# Patient Record
Sex: Female | Born: 1965
Health system: Southern US, Community
[De-identification: ages and names within clinical notes are randomized; demographics above are authoritative.]

## PROBLEM LIST (undated history)

## (undated) DIAGNOSIS — I493 Ventricular premature depolarization: Secondary | ICD-10-CM

## (undated) DIAGNOSIS — K2210 Ulcer of esophagus without bleeding: Secondary | ICD-10-CM

## (undated) DIAGNOSIS — E162 Hypoglycemia, unspecified: Secondary | ICD-10-CM

## (undated) DIAGNOSIS — E876 Hypokalemia: Secondary | ICD-10-CM

## (undated) DIAGNOSIS — Z8619 Personal history of other infectious and parasitic diseases: Secondary | ICD-10-CM

## (undated) DIAGNOSIS — J189 Pneumonia, unspecified organism: Secondary | ICD-10-CM

## (undated) DIAGNOSIS — D259 Leiomyoma of uterus, unspecified: Secondary | ICD-10-CM

## (undated) DIAGNOSIS — D649 Anemia, unspecified: Secondary | ICD-10-CM

## (undated) DIAGNOSIS — Z9289 Personal history of other medical treatment: Secondary | ICD-10-CM

## (undated) DIAGNOSIS — K469 Unspecified abdominal hernia without obstruction or gangrene: Secondary | ICD-10-CM

## (undated) DIAGNOSIS — E7800 Pure hypercholesterolemia, unspecified: Secondary | ICD-10-CM

## (undated) DIAGNOSIS — J45909 Unspecified asthma, uncomplicated: Secondary | ICD-10-CM

## (undated) DIAGNOSIS — I959 Hypotension, unspecified: Secondary | ICD-10-CM

## (undated) HISTORY — PX: HERNIA REPAIR: SHX51

## (undated) HISTORY — DX: Unspecified abdominal hernia without obstruction or gangrene: K46.9

## (undated) HISTORY — DX: Hypotension, unspecified: I95.9

## (undated) HISTORY — DX: Personal history of other infectious and parasitic diseases: Z86.19

## (undated) HISTORY — DX: Ventricular premature depolarization: I49.3

## (undated) HISTORY — PX: ABDOMINAL HERNIA REPAIR: SHX539

---

## 2009-04-21 ENCOUNTER — Ambulatory Visit: Payer: Self-pay | Admitting: Diagnostic Radiology

## 2009-04-21 ENCOUNTER — Emergency Department (HOSPITAL_BASED_OUTPATIENT_CLINIC_OR_DEPARTMENT_OTHER): Admission: EM | Admit: 2009-04-21 | Discharge: 2009-04-21 | Payer: Self-pay | Admitting: Emergency Medicine

## 2009-07-20 ENCOUNTER — Emergency Department (HOSPITAL_BASED_OUTPATIENT_CLINIC_OR_DEPARTMENT_OTHER): Admission: EM | Admit: 2009-07-20 | Discharge: 2009-07-20 | Payer: Self-pay | Admitting: Emergency Medicine

## 2009-07-20 ENCOUNTER — Ambulatory Visit: Payer: Self-pay | Admitting: Diagnostic Radiology

## 2010-06-26 LAB — BASIC METABOLIC PANEL
BUN: 10 mg/dL (ref 6–23)
CO2: 29 mEq/L (ref 19–32)
Calcium: 9.2 mg/dL (ref 8.4–10.5)
Chloride: 104 mEq/L (ref 96–112)
Creatinine, Ser: 0.7 mg/dL (ref 0.4–1.2)
Glucose, Bld: 90 mg/dL (ref 70–99)

## 2010-06-26 LAB — DIFFERENTIAL
Eosinophils Absolute: 0.3 10*3/uL (ref 0.0–0.7)
Eosinophils Relative: 2 % (ref 0–5)
Lymphs Abs: 4.4 10*3/uL — ABNORMAL HIGH (ref 0.7–4.0)
Monocytes Absolute: 0.8 10*3/uL (ref 0.1–1.0)

## 2010-06-26 LAB — POCT CARDIAC MARKERS: CKMB, poc: <1 ng/mL — ABNORMAL LOW (ref 1.0–8.0)

## 2010-06-26 LAB — CBC
Platelets: 429 10*3/uL — ABNORMAL HIGH (ref 150–400)
WBC: 14.7 10*3/uL — ABNORMAL HIGH (ref 4.0–10.5)

## 2010-06-29 LAB — BASIC METABOLIC PANEL
BUN: 12 mg/dL (ref 6–23)
GFR calc Af Amer: >60 mL/min (ref >60)
Potassium: 4.2 mEq/L (ref 3.5–5.1)
Sodium: 141 mEq/L (ref 135–145)

## 2010-06-29 LAB — DIFFERENTIAL
Basophils Absolute: 0.2 10*3/uL — ABNORMAL HIGH (ref 0.0–0.1)
Basophils Relative: 2 % — ABNORMAL HIGH (ref 0–1)
Eosinophils Absolute: 0.2 10*3/uL (ref 0.0–0.7)
Eosinophils Relative: 1 % (ref 0–5)
Monocytes Absolute: 0.5 10*3/uL (ref 0.1–1.0)
Monocytes Relative: 4 % (ref 3–12)

## 2010-06-29 LAB — CBC
Hemoglobin: 13.4 g/dL (ref 12.0–15.0)
MCHC: 32.3 g/dL (ref 30.0–36.0)
MCV: 87.6 fL (ref 78.0–100.0)
Platelets: 398 10*3/uL (ref 150–400)
RBC: 4.74 MIL/uL (ref 3.87–5.11)
RDW: 14 % (ref 11.5–15.5)
WBC: 11.9 10*3/uL — ABNORMAL HIGH (ref 4.0–10.5)

## 2010-06-29 LAB — POCT CARDIAC MARKERS
CKMB, poc: <1 ng/mL — ABNORMAL LOW (ref 1.0–8.0)
Troponin i, poc: <0.05 ng/mL (ref 0.00–0.09)

## 2010-08-19 ENCOUNTER — Emergency Department (INDEPENDENT_AMBULATORY_CARE_PROVIDER_SITE_OTHER): Payer: Self-pay

## 2010-08-19 ENCOUNTER — Emergency Department (HOSPITAL_BASED_OUTPATIENT_CLINIC_OR_DEPARTMENT_OTHER)
Admission: EM | Admit: 2010-08-19 | Discharge: 2010-08-20 | Disposition: A | Discharge status: Other Acute Inpatient Hospital | Payer: Self-pay | Attending: Emergency Medicine | Admitting: Emergency Medicine

## 2010-08-19 DIAGNOSIS — R002 Palpitations: Secondary | ICD-10-CM

## 2010-08-19 DIAGNOSIS — K219 Gastro-esophageal reflux disease without esophagitis: Secondary | ICD-10-CM | POA: Insufficient documentation

## 2010-08-19 DIAGNOSIS — E669 Obesity, unspecified: Secondary | ICD-10-CM | POA: Insufficient documentation

## 2010-08-19 DIAGNOSIS — E7800 Pure hypercholesterolemia, unspecified: Secondary | ICD-10-CM | POA: Insufficient documentation

## 2010-08-19 LAB — URINALYSIS, ROUTINE W REFLEX MICROSCOPIC
Bilirubin Urine: NEGATIVE
Glucose, UA: NEGATIVE mg/dL
Hgb urine dipstick: NEGATIVE
Ketones, ur: NEGATIVE mg/dL
Nitrite: POSITIVE — AB
Protein, ur: NEGATIVE mg/dL
Specific Gravity, Urine: 1.025 (ref 1.005–1.030)
Urobilinogen, UA: 0.2 mg/dL (ref 0.0–1.0)
pH: 5.5 (ref 5.0–8.0)

## 2010-08-19 LAB — COMPREHENSIVE METABOLIC PANEL
ALT: 11 U/L (ref 0–35)
AST: 14 U/L (ref 0–37)
Albumin: 3.6 g/dL (ref 3.5–5.2)
Chloride: 103 mEq/L (ref 96–112)
Creatinine, Ser: 0.6 mg/dL (ref 0.4–1.2)
Potassium: 3.7 mEq/L (ref 3.5–5.1)
Sodium: 139 mEq/L (ref 135–145)

## 2010-08-19 LAB — COMPREHENSIVE METABOLIC PANEL WITH GFR
Alkaline Phosphatase: 92 U/L (ref 39–117)
BUN: 12 mg/dL (ref 6–23)
CO2: 26 meq/L (ref 19–32)
Calcium: 9.7 mg/dL (ref 8.4–10.5)
GFR calc Af Amer: >60 mL/min (ref >60)
GFR calc non Af Amer: >60 mL/min (ref >60)
Glucose, Bld: 118 mg/dL — ABNORMAL HIGH (ref 70–99)
Total Bilirubin: 0.2 mg/dL — ABNORMAL LOW (ref 0.3–1.2)
Total Protein: 6.9 g/dL (ref 6.0–8.3)

## 2010-08-19 LAB — CBC
MCH: 28.2 pg (ref 26.0–34.0)
MCHC: 32.8 g/dL (ref 30.0–36.0)
MCV: 86 fL (ref 78.0–100.0)
Platelets: 375 10*3/uL (ref 150–400)

## 2010-08-19 LAB — CK TOTAL AND CKMB (NOT AT ARMC)
CK, MB: 3.3 ng/mL (ref 0.3–4.0)
Relative Index: 1.9 (ref 0.0–2.5)
Total CK: 174 U/L (ref 7–177)

## 2010-08-19 LAB — URINE MICROSCOPIC-ADD ON

## 2010-08-19 LAB — MAGNESIUM: Magnesium: 2.3 mg/dL (ref 1.5–2.5)

## 2010-08-19 LAB — TROPONIN I: Troponin I: <0.3 ng/mL (ref <0.30)

## 2010-08-19 LAB — TSH: TSH: 0.728 u[IU]/mL (ref 0.350–4.500)

## 2011-07-09 ENCOUNTER — Emergency Department (HOSPITAL_BASED_OUTPATIENT_CLINIC_OR_DEPARTMENT_OTHER)
Admission: EM | Admit: 2011-07-09 | Discharge: 2011-07-09 | Disposition: A | Discharge status: Home / Self Care | Payer: Self-pay | Attending: Emergency Medicine | Admitting: Emergency Medicine

## 2011-07-09 ENCOUNTER — Encounter (HOSPITAL_BASED_OUTPATIENT_CLINIC_OR_DEPARTMENT_OTHER): Payer: Self-pay | Admitting: *Deleted

## 2011-07-09 ENCOUNTER — Emergency Department (INDEPENDENT_AMBULATORY_CARE_PROVIDER_SITE_OTHER): Payer: Self-pay

## 2011-07-09 DIAGNOSIS — F17200 Nicotine dependence, unspecified, uncomplicated: Secondary | ICD-10-CM | POA: Insufficient documentation

## 2011-07-09 DIAGNOSIS — J189 Pneumonia, unspecified organism: Secondary | ICD-10-CM | POA: Insufficient documentation

## 2011-07-09 DIAGNOSIS — R05 Cough: Secondary | ICD-10-CM

## 2011-07-09 DIAGNOSIS — R059 Cough, unspecified: Secondary | ICD-10-CM

## 2011-07-09 DIAGNOSIS — R918 Other nonspecific abnormal finding of lung field: Secondary | ICD-10-CM

## 2011-07-09 DIAGNOSIS — R0602 Shortness of breath: Secondary | ICD-10-CM

## 2011-07-09 MED ORDER — ALBUTEROL SULFATE HFA 108 (90 BASE) MCG/ACT IN AERS
INHALATION_SPRAY | RESPIRATORY_TRACT | Status: AC
  Filled 2011-07-09: qty 6.7

## 2011-07-09 MED ORDER — MOXIFLOXACIN HCL 400 MG PO TABS
400.0000 mg | ORAL_TABLET | Freq: Once | ORAL | Status: AC
  Administered 2011-07-09: 400 mg via ORAL
  Filled 2011-07-09: qty 1

## 2011-07-09 MED ORDER — ALBUTEROL SULFATE HFA 108 (90 BASE) MCG/ACT IN AERS
2.0000 | INHALATION_SPRAY | RESPIRATORY_TRACT | Status: DC
  Administered 2011-07-09: 2 via RESPIRATORY_TRACT

## 2011-07-09 MED ORDER — ALBUTEROL SULFATE (5 MG/ML) 0.5% IN NEBU
5.0000 mg | INHALATION_SOLUTION | Freq: Once | RESPIRATORY_TRACT | Status: AC
  Administered 2011-07-09: 5 mg via RESPIRATORY_TRACT
  Filled 2011-07-09: qty 1

## 2011-07-09 MED ORDER — LEVOFLOXACIN 250 MG PO TABS: 250.0000 mg | ORAL_TABLET | Freq: Every day | ORAL | Status: AC

## 2011-07-09 NOTE — ED Notes (Signed)
C/o cough since Sunday. Has been taking Tylenol Cold and Flu without relief

## 2011-07-09 NOTE — ED Provider Notes (Signed)
History     CSN: [id number]  Arrival date & time 07/09/11  0115   First MD Initiated Contact with Patient 07/09/11 0140      Chief Complaint  Patient presents with  . Cough    (Consider location/radiation/quality/duration/timing/severity/associated sxs/prior treatment) The history is provided by the patient.   the patient reports ongoing productive cough for 6-7 days.  She had fever for the first several days but was taking a friend's antibiotic which seems to stop the fever however her cough continues.  She continues to smoke cigarettes.  She has no shortness of breath at this time.  She denies chest pain.  She has no nausea vomiting or diarrhea.  She denies melena.  She has no history of asthma or COPD.  She has not tried an albuterol inhaler for her breathing.  She has tried over-the-counter medications for her coughing without any relief.  She's having difficulty sleeping because of the cough and that she presents the ER tonight.  Nothing worsens her symptoms.  Nothing improves her symptoms.  Her symptoms are constant.  Her symptoms are moderate in severity.  She does not have a primary care physician  Past Medical History  Diagnosis Date  . Hernia     Past Surgical History  Procedure Date  . Cesarean section   . Hernia repair     No family history on file.  History  Substance Use Topics  . Smoking status: Current Everyday Smoker  . Smokeless tobacco: Not on file  . Alcohol Use: No    OB History    Grav Para Term Preterm Abortions TAB SAB Ect Mult Living                  Review of Systems  All other systems reviewed and are negative.    Allergies  Review of patient's allergies indicates no known allergies.  Home Medications   Current Outpatient Rx  Name Route Sig Dispense Refill  . LEVOFLOXACIN 250 MG PO TABS Oral Take 1 tablet (250 mg total) by mouth daily. 7 tablet 0    BP 113/62  Pulse 61  Temp(Src) 98.7 F (37.1 C) (Oral)  Resp 18  SpO2 100%   LMP 06/24/2011  Physical Exam  Nursing note and vitals reviewed. Constitutional: She is oriented to person, place, and time. She appears well-developed and well-nourished. No distress.       Coughing  HENT:  Head: Normocephalic and atraumatic.  Eyes: EOM are normal.  Neck: Normal range of motion.  Cardiovascular: Normal rate, regular rhythm and normal heart sounds.   Pulmonary/Chest: Effort normal and breath sounds normal.  Abdominal: Soft. She exhibits no distension. There is no tenderness.  Musculoskeletal: Normal range of motion.  Neurological: She is alert and oriented to person, place, and time.  Skin: Skin is warm and dry.  Psychiatric: She has a normal mood and affect. Judgment normal.    ED Course  Procedures (including critical care time)  Labs Reviewed - No data to display Dg Chest 2 View  07/09/2011  *RADIOLOGY REPORT*  Clinical Data: Cough and shortness of breath.  Smoker.  CHEST - 2 VIEW  Comparison: 08/19/2010  Findings: Interval development since previous study of focal consolidation in the right lower lung posteriorly.  Changes are consistent with pneumonia.  Follow up after resolution of acute symptoms is recommended to exclude underlying obstructing lesion.  Normal heart size and pulmonary vascularity.  No blunting of costophrenic angles.  No pneumothorax.  IMPRESSION: Focal  consolidation in the right lower lung posteriorly consistent with pneumonia.  Original Report Authenticated By: Marlon Pel, M.D.     1. CAP (community acquired pneumonia)       MDM  No shortness of breath at this time.  She does have a right lower lobe pneumonia on exam.  She feels much better after albuterol inhaler.  She's been prescribed an antibiotic and given an inhaler to go home with here.  She does smoke cigarettes and she's been instructed to stop smoking cigarettes.  I stressed to her the importance of followup x-ray in 6-8 weeks to make sure this is cleared from a  radiographic standpoint.  She understands the importance of this.  She understands to return to the emergency department for new or worsening symptoms        Lyanne Co, MD 07/09/11 [phone]

## 2011-07-09 NOTE — Discharge Instructions (Signed)

## 2011-07-10 NOTE — ED Notes (Signed)
Pt called requesting a change in prescription from levaquin to something cheaper. Dr. Oletta Lamas reviewed pt's info and prescribed Z-pak. [facility] on [address] called and given information.

## 2012-05-08 ENCOUNTER — Emergency Department (HOSPITAL_BASED_OUTPATIENT_CLINIC_OR_DEPARTMENT_OTHER): Payer: Self-pay

## 2012-05-08 ENCOUNTER — Encounter (HOSPITAL_BASED_OUTPATIENT_CLINIC_OR_DEPARTMENT_OTHER): Payer: Self-pay | Admitting: *Deleted

## 2012-05-08 ENCOUNTER — Emergency Department (HOSPITAL_BASED_OUTPATIENT_CLINIC_OR_DEPARTMENT_OTHER)
Admission: EM | Admit: 2012-05-08 | Discharge: 2012-05-08 | Disposition: A | Discharge status: Home / Self Care | Payer: Self-pay | Attending: Emergency Medicine | Admitting: Emergency Medicine

## 2012-05-08 DIAGNOSIS — Z8719 Personal history of other diseases of the digestive system: Secondary | ICD-10-CM | POA: Insufficient documentation

## 2012-05-08 DIAGNOSIS — J189 Pneumonia, unspecified organism: Secondary | ICD-10-CM | POA: Insufficient documentation

## 2012-05-08 DIAGNOSIS — IMO0001 Reserved for inherently not codable concepts without codable children: Secondary | ICD-10-CM | POA: Insufficient documentation

## 2012-05-08 DIAGNOSIS — R509 Fever, unspecified: Secondary | ICD-10-CM | POA: Insufficient documentation

## 2012-05-08 DIAGNOSIS — F17200 Nicotine dependence, unspecified, uncomplicated: Secondary | ICD-10-CM | POA: Insufficient documentation

## 2012-05-08 MED ORDER — AZITHROMYCIN 250 MG PO TABS: 250.0000 mg | ORAL_TABLET | Freq: Every day | ORAL | Status: DC

## 2012-05-08 MED ORDER — GUAIFENESIN 100 MG/5ML PO SOLN
5.0000 mL | Freq: Once | ORAL | Status: AC
  Administered 2012-05-08: 100 mg via ORAL
  Filled 2012-05-08: qty 5

## 2012-05-08 NOTE — ED Provider Notes (Signed)
Medical screening examination/treatment/procedure(s) were performed by non-physician practitioner and as supervising physician I was immediately available for consultation/collaboration.   Mitzie Marlar B. Mckennah Kretchmer, MD 05/08/12 2312 

## 2012-05-08 NOTE — ED Provider Notes (Signed)
History     CSN: [id number]  Arrival date & time 05/08/12  1652   First MD Initiated Contact with Patient 05/08/12 1659      Chief Complaint  Patient presents with  . URI    (Consider location/radiation/quality/duration/timing/severity/associated sxs/prior treatment) HPI Comments: Patient is a 47 year old female who presents with a 2 day history of multiple complaints including non productive cough, body aches, subjective fever and chills. Symptoms started gradually and progressively worsened since the onset. Symptoms are constant. Patient has tried tylenol and delsym for symptoms without relief. No aggravating/alleviating factors.    Past Medical History  Diagnosis Date  . Hernia     Past Surgical History  Procedure Date  . Cesarean section   . Hernia repair   . Cesarean section     History reviewed. No pertinent family history.  History  Substance Use Topics  . Smoking status: Current Every Day Smoker -- 0.5 packs/day  . Smokeless tobacco: Not on file  . Alcohol Use: No    OB History    Grav Para Term Preterm Abortions TAB SAB Ect Mult Living                  Review of Systems  Constitutional: Positive for fever and fatigue.  Respiratory: Positive for cough.   Musculoskeletal: Positive for myalgias.  All other systems reviewed and are negative.    Allergies  Review of patient's allergies indicates no known allergies.  Home Medications  No current outpatient prescriptions on file.  BP 125/97  Pulse 85  Temp 98.4 F (36.9 C) (Oral)  Resp 18  Ht 5\' 7"  (1.702 m)  Wt 200 lb (90.719 kg)  BMI 31.32 kg/m2  SpO2 100%  LMP 05/05/2012  Physical Exam  Nursing note and vitals reviewed. Constitutional: She is oriented to person, place, and time. She appears well-developed and well-nourished. No distress.       Patient coughing throughout interview.   HENT:  Head: Normocephalic and atraumatic.  Mouth/Throat: Oropharynx is clear and moist. No  oropharyngeal exudate.       No sinus tenderness to palpation.   Eyes: Conjunctivae normal and EOM are normal. Pupils are equal, round, and reactive to light.  Neck: Normal range of motion. Neck supple.  Cardiovascular: Normal rate and regular rhythm.  Exam reveals no gallop and no friction rub.   No murmur heard. Pulmonary/Chest: Effort normal and breath sounds normal. She has no wheezes. She has no rales. She exhibits no tenderness.  Abdominal: Soft. She exhibits no distension. There is no tenderness. There is no rebound.  Musculoskeletal: Normal range of motion.  Lymphadenopathy:    She has no cervical adenopathy.  Neurological: She is alert and oriented to person, place, and time.       Speech is goal-oriented. Moves limbs without ataxia.   Skin: Skin is warm and dry.  Psychiatric: She has a normal mood and affect. Her behavior is normal.    ED Course  Procedures (including critical care time)  Labs Reviewed - No data to display Dg Chest 2 View  05/08/2012  *RADIOLOGY REPORT*  Clinical Data: Cough and congestion.  CHEST - 2 VIEW  Comparison: Two-view chest 07/09/2011.  Findings: Heart size is normal.  Subtle right lower lobe airspace opacification is in a similar location to the prior study.  No other airspace consolidation is evident.  IMPRESSION:  1.  Subtle right lower lobe airspace opacification.  This may represent scarring from previous pneumonia.  Recurrent infection is not excluded.   Original Report Authenticated By: Marin Roberts, M.D.      1. CAP (community acquired pneumonia)       MDM  5:24 PM Chest xray pending. Patient will have robitussin for cough.   5:47 PM Xray shows scarring vs. Recurrent infection. I will treat the patient for CAP in the setting of her symptoms. Patient instructed to return with worsening or concerning symptoms. Patient afebrile and non toxic appearing. No further evaluation needed at this time.       Emilia Beck,  PA-C 05/08/12 1946

## 2012-05-08 NOTE — ED Notes (Signed)
Pt c/o URi symptoms x 2 days 

## 2013-01-04 DIAGNOSIS — Z792 Long term (current) use of antibiotics: Secondary | ICD-10-CM | POA: Insufficient documentation

## 2013-01-04 DIAGNOSIS — Z8719 Personal history of other diseases of the digestive system: Secondary | ICD-10-CM | POA: Insufficient documentation

## 2013-01-04 DIAGNOSIS — Y9389 Activity, other specified: Secondary | ICD-10-CM | POA: Insufficient documentation

## 2013-01-04 DIAGNOSIS — Y92410 Unspecified street and highway as the place of occurrence of the external cause: Secondary | ICD-10-CM | POA: Insufficient documentation

## 2013-01-04 DIAGNOSIS — S139XXA Sprain of joints and ligaments of unspecified parts of neck, initial encounter: Secondary | ICD-10-CM | POA: Insufficient documentation

## 2013-01-04 DIAGNOSIS — F17200 Nicotine dependence, unspecified, uncomplicated: Secondary | ICD-10-CM | POA: Insufficient documentation
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
## 2013-01-05 ENCOUNTER — Encounter (HOSPITAL_BASED_OUTPATIENT_CLINIC_OR_DEPARTMENT_OTHER): Payer: Self-pay | Admitting: Emergency Medicine

## 2013-01-05 ENCOUNTER — Emergency Department (HOSPITAL_BASED_OUTPATIENT_CLINIC_OR_DEPARTMENT_OTHER): Payer: No Typology Code available for payment source

## 2013-01-05 ENCOUNTER — Emergency Department (HOSPITAL_BASED_OUTPATIENT_CLINIC_OR_DEPARTMENT_OTHER)
Admission: EM | Admit: 2013-01-05 | Discharge: 2013-01-05 | Disposition: A | Discharge status: Home / Self Care | Payer: No Typology Code available for payment source | Attending: Emergency Medicine | Admitting: Emergency Medicine

## 2013-01-05 DIAGNOSIS — S161XXA Strain of muscle, fascia and tendon at neck level, initial encounter: Secondary | ICD-10-CM

## 2013-01-05 MED ORDER — HYDROCODONE-ACETAMINOPHEN 5-325 MG PO TABS
1.0000 | ORAL_TABLET | Freq: Once | ORAL | Status: AC
  Administered 2013-01-05: 1 via ORAL

## 2013-01-05 MED ORDER — METHOCARBAMOL 500 MG PO TABS
ORAL_TABLET | ORAL | Status: AC
  Administered 2013-01-05: 500 mg via ORAL
  Filled 2013-01-05: qty 1

## 2013-01-05 MED ORDER — METHOCARBAMOL 500 MG PO TABS
500.0000 mg | ORAL_TABLET | Freq: Once | ORAL | Status: AC
  Administered 2013-01-05: 500 mg via ORAL

## 2013-01-05 MED ORDER — CYCLOBENZAPRINE HCL 10 MG PO TABS: 10.0000 mg | ORAL_TABLET | Freq: Three times a day (TID) | ORAL | Status: DC | PRN

## 2013-01-05 MED ORDER — HYDROCODONE-ACETAMINOPHEN 5-325 MG PO TABS
ORAL_TABLET | ORAL | Status: AC
  Administered 2013-01-05: 1 via ORAL
  Filled 2013-01-05: qty 1

## 2013-01-05 MED ORDER — HYDROCODONE-ACETAMINOPHEN 5-325 MG PO TABS: 1.0000 | ORAL_TABLET | Freq: Four times a day (QID) | ORAL | Status: DC | PRN

## 2013-01-05 NOTE — ED Notes (Signed)
Patient transported to X-ray 
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
## 2013-07-30 ENCOUNTER — Emergency Department (HOSPITAL_BASED_OUTPATIENT_CLINIC_OR_DEPARTMENT_OTHER)
Admission: EM | Admit: 2013-07-30 | Discharge: 2013-07-30 | Disposition: A | Discharge status: Home / Self Care | Payer: Managed Care, Other (non HMO) | Attending: Emergency Medicine | Admitting: Emergency Medicine

## 2013-07-30 ENCOUNTER — Encounter (HOSPITAL_BASED_OUTPATIENT_CLINIC_OR_DEPARTMENT_OTHER): Payer: Self-pay | Admitting: Emergency Medicine

## 2013-07-30 DIAGNOSIS — F17200 Nicotine dependence, unspecified, uncomplicated: Secondary | ICD-10-CM | POA: Insufficient documentation

## 2013-07-30 DIAGNOSIS — T4995XA Adverse effect of unspecified topical agent, initial encounter: Secondary | ICD-10-CM | POA: Insufficient documentation

## 2013-07-30 DIAGNOSIS — W57XXXA Bitten or stung by nonvenomous insect and other nonvenomous arthropods, initial encounter: Secondary | ICD-10-CM | POA: Insufficient documentation

## 2013-07-30 DIAGNOSIS — H5710 Ocular pain, unspecified eye: Secondary | ICD-10-CM | POA: Insufficient documentation

## 2013-07-30 DIAGNOSIS — Y9389 Activity, other specified: Secondary | ICD-10-CM | POA: Insufficient documentation

## 2013-07-30 DIAGNOSIS — Y92009 Unspecified place in unspecified non-institutional (private) residence as the place of occurrence of the external cause: Secondary | ICD-10-CM | POA: Insufficient documentation

## 2013-07-30 DIAGNOSIS — T7840XA Allergy, unspecified, initial encounter: Secondary | ICD-10-CM

## 2013-07-30 DIAGNOSIS — H02849 Edema of unspecified eye, unspecified eyelid: Secondary | ICD-10-CM | POA: Insufficient documentation

## 2013-07-30 DIAGNOSIS — S1096XA Insect bite of unspecified part of neck, initial encounter: Secondary | ICD-10-CM | POA: Insufficient documentation

## 2013-07-30 MED ORDER — FLUORESCEIN SODIUM 1 MG OP STRP
1.0000 | ORAL_STRIP | Freq: Once | OPHTHALMIC | Status: AC
  Administered 2013-07-30: 1 via OPHTHALMIC
  Filled 2013-07-30: qty 1

## 2013-07-30 MED ORDER — ACYCLOVIR 800 MG PO TABS: 400.0000 mg | ORAL_TABLET | Freq: Every day | ORAL | Status: DC

## 2013-07-30 MED ORDER — PREDNISONE 20 MG PO TABS: ORAL_TABLET | ORAL | Status: DC

## 2013-07-30 NOTE — Discharge Instructions (Signed)
Insect Bite Mosquitoes, flies, fleas, bedbugs, and many other insects can bite. Insect bites are different from insect stings. A sting is when venom is injected into the skin. Some insect bites can transmit infectious diseases. SYMPTOMS  Insect bites usually turn red, swell, and itch for 2 to 4 days. They often go away on their own. TREATMENT  Your caregiver may prescribe antibiotic medicines if a bacterial infection develops in the bite. HOME CARE INSTRUCTIONS  Do not scratch the bite area.  Keep the bite area clean and dry. Wash the bite area thoroughly with soap and water.  Put ice or cool compresses on the bite area.  Put ice in a plastic bag.  Place a towel between your skin and the bag.  Leave the ice on for 20 minutes, 4 times a day for the first 2 to 3 days, or as directed.  You may apply a baking soda paste, cortisone cream, or calamine lotion to the bite area as directed by your caregiver. This can help reduce itching and swelling.  Only take over-the-counter or prescription medicines as directed by your caregiver.  If you are given antibiotics, take them as directed. Finish them even if you start to feel better. You may need a tetanus shot if:  You cannot remember when you had your last tetanus shot.  You have never had a tetanus shot.  The injury broke your skin. If you get a tetanus shot, your arm may swell, get red, and feel warm to the touch. This is common and not a problem. If you need a tetanus shot and you choose not to have one, there is a rare chance of getting tetanus. Sickness from tetanus can be serious. SEEK IMMEDIATE MEDICAL CARE IF:   You have increased pain, redness, or swelling in the bite area.  You see a red line on the skin coming from the bite.  You have a fever.  You have joint pain.  You have a headache or neck pain.  You have unusual weakness.  You have a rash.  You have chest pain or shortness of breath.  You have abdominal pain,  nausea, or vomiting.  You feel unusually tired or sleepy. MAKE SURE YOU:   Understand these instructions.  Will watch your condition.  Will get help right away if you are not doing well or get worse. Document Released: 05/04/2004 Document Revised: 06/19/2011 Document Reviewed: 10/26/2010 [facility] Patient Information 2014 [place]. Possible Shingles Shingles (herpes zoster) is an infection that is caused by the same virus that causes chickenpox (varicella). The infection causes a painful skin rash and fluid-filled blisters, which eventually break open, crust over, and heal. It may occur in any area of the body, but it usually affects only one side of the body or face. The pain of shingles usually lasts about 1 month. However, some people with shingles may develop long-term (chronic) pain in the affected area of the body. Shingles often occurs many years after the person had chickenpox. It is more common:  In people older than 50 years.  In people with weakened immune systems, such as those with HIV, AIDS, or cancer.  In people taking medicines that weaken the immune system, such as transplant medicines.  In people under great stress. CAUSES  Shingles is caused by the varicella zoster virus (VZV), which also causes chickenpox. After a person is infected with the virus, it can remain in the person's body for years in an inactive state (dormant). To cause  shingles, the virus reactivates and breaks out as an infection in a nerve root. The virus can be spread from person to person (contagious) through contact with open blisters of the shingles rash. It will only spread to people who have not had chickenpox. When these people are exposed to the virus, they may develop chickenpox. They will not develop shingles. Once the blisters scab over, the person is no longer contagious and cannot spread the virus to others. SYMPTOMS  Shingles shows up in stages. The initial symptoms may be pain,  itching, and tingling in an area of the skin. This pain is usually described as burning, stabbing, or throbbing.In a few days or weeks, a painful red rash will appear in the area where the pain, itching, and tingling were felt. The rash is usually on one side of the body in a band or belt-like pattern. Then, the rash usually turns into fluid-filled blisters. They will scab over and dry up in approximately 2 3 weeks. Flu-like symptoms may also occur with the initial symptoms, the rash, or the blisters. These may include:  Fever.  Chills.  Headache.  Upset stomach. DIAGNOSIS  Your caregiver will perform a skin exam to diagnose shingles. Skin scrapings or fluid samples may also be taken from the blisters. This sample will be examined under a microscope or sent to a lab for further testing. TREATMENT  There is no specific cure for shingles. Your caregiver will likely prescribe medicines to help you manage the pain, recover faster, and avoid long-term problems. This may include antiviral drugs, anti-inflammatory drugs, and pain medicines. HOME CARE INSTRUCTIONS   Take a cool bath or apply cool compresses to the area of the rash or blisters as directed. This may help with the pain and itching.   Only take over-the-counter or prescription medicines as directed by your caregiver.   Rest as directed by your caregiver.  Keep your rash and blisters clean with mild soap and cool water or as directed by your caregiver.  Do not pick your blisters or scratch your rash. Apply an anti-itch cream or numbing creams to the affected area as directed by your caregiver.  Keep your shingles rash covered with a loose bandage (dressing).  Avoid skin contact with:  Babies.   Pregnant women.   Children with eczema.   Elderly people with transplants.   People with chronic illnesses, such as leukemia or AIDS.   Wear loose-fitting clothing to help ease the pain of material rubbing against the  rash.  Keep all follow-up appointments with your caregiver.If the area involved is on your face, you may receive a referral for follow-up to a specialist, such as an eye doctor (ophthalmologist) or an ear, nose, and throat (ENT) doctor. Keeping all follow-up appointments will help you avoid eye complications, chronic pain, or disability.  SEEK IMMEDIATE MEDICAL CARE IF:   You have facial pain, pain around the eye area, or loss of feeling on one side of your face.  You have ear pain or ringing in your ear.  You have loss of taste.  Your pain is not relieved with prescribed medicines.   Your redness or swelling spreads.   You have more pain and swelling.  Your condition is worsening or has changed.   You have a feveror persistent symptoms for more than 2 3 days.  You have a fever and your symptoms suddenly get worse. MAKE SURE YOU:  Understand these instructions.  Will watch your condition.  Will get help  right away if you are not doing well or get worse. Document Released: 03/27/2005 Document Revised: 12/20/2011 Document Reviewed: 11/09/2011 [facility] Patient Information 2014 [place]. Contact Dermatitis Contact dermatitis is a reaction to certain substances that touch the skin. Contact dermatitis can be either irritant contact dermatitis or allergic contact dermatitis. Irritant contact dermatitis does not require previous exposure to the substance for a reaction to occur.Allergic contact dermatitis only occurs if you have been exposed to the substance before. Upon a repeat exposure, your body reacts to the substance.  CAUSES  Many substances can cause contact dermatitis. Irritant dermatitis is most commonly caused by repeated exposure to mildly irritating substances, such as:  Makeup.  Soaps.  Detergents.  Bleaches.  Acids.  Metal salts, such as nickel. Allergic contact dermatitis is most commonly caused by exposure to:  Poisonous plants.  Chemicals  (deodorants, shampoos).  Jewelry.  Latex.  Neomycin in triple antibiotic cream.  Preservatives in products, including clothing. SYMPTOMS  The area of skin that is exposed may develop:  Dryness or flaking.  Redness.  Cracks.  Itching.  Pain or a burning sensation.  Blisters. With allergic contact dermatitis, there may also be swelling in areas such as the eyelids, mouth, or genitals.  DIAGNOSIS  Your caregiver can usually tell what the problem is by doing a physical exam. In cases where the cause is uncertain and an allergic contact dermatitis is suspected, a patch skin test may be performed to help determine the cause of your dermatitis. TREATMENT Treatment includes protecting the skin from further contact with the irritating substance by avoiding that substance if possible. Barrier creams, powders, and gloves may be helpful. Your caregiver may also recommend:  Steroid creams or ointments applied 2 times daily. For best results, soak the rash area in cool water for 20 minutes. Then apply the medicine. Cover the area with a plastic wrap. You can store the steroid cream in the refrigerator for a "chilly" effect on your rash. That may decrease itching. Oral steroid medicines may be needed in more severe cases.  Antibiotics or antibacterial ointments if a skin infection is present.  Antihistamine lotion or an antihistamine taken by mouth to ease itching.  Lubricants to keep moisture in your skin.  Burow's solution to reduce redness and soreness or to dry a weeping rash. Mix one packet or tablet of solution in 2 cups cool water. Dip a clean washcloth in the mixture, wring it out a bit, and put it on the affected area. Leave the cloth in place for 30 minutes. Do this as often as possible throughout the day.  Taking several cornstarch or baking soda baths daily if the area is too large to cover with a washcloth. Harsh chemicals, such as alkalis or acids, can cause skin damage that is  like a burn. You should flush your skin for 15 to 20 minutes with cold water after such an exposure. You should also seek immediate medical care after exposure. Bandages (dressings), antibiotics, and pain medicine may be needed for severely irritated skin.  HOME CARE INSTRUCTIONS  Avoid the substance that caused your reaction.  Keep the area of skin that is affected away from hot water, soap, sunlight, chemicals, acidic substances, or anything else that would irritate your skin.  Do not scratch the rash. Scratching may cause the rash to become infected.  You may take cool baths to help stop the itching.  Only take over-the-counter or prescription medicines as directed by your caregiver.  See your  caregiver for follow-up care as directed to make sure your skin is healing properly. SEEK MEDICAL CARE IF:   Your condition is not better after 3 days of treatment.  You seem to be getting worse.  You see signs of infection such as swelling, tenderness, redness, soreness, or warmth in the affected area.  You have any problems related to your medicines. Document Released: 03/24/2000 Document Revised: 06/19/2011 Document Reviewed: 08/30/2010 [facility] Patient Information 2014 [place], [place].

## 2013-07-30 NOTE — ED Notes (Signed)
Woke with swelling/pain to right side of face-swelling is noted to right eye lid

## 2013-07-30 NOTE — ED Provider Notes (Signed)
CSN: [id number]     Arrival date & time 07/30/13  1116 History   First MD Initiated Contact with Patient 07/30/13 1127     Chief Complaint  Patient presents with  . Facial Swelling     (Consider location/radiation/quality/duration/timing/severity/associated sxs/prior Treatment) HPI Comments: Patient reports that she woke around 3 AM this morning with swelling and pain around her right eye and right side of her face. She also has noticed 2 large areas on her arm that are similar to when she gets mosquito bites. Patient denies any known injury around the. There is no vision change or pain in the eye itself, the pain is all in the eyelid. No tongue swelling, throat swelling or difficulty breathing.   Past Medical History  Diagnosis Date  . Hernia    Past Surgical History  Procedure Laterality Date  . Cesarean section    . Hernia repair    . Cesarean section     No family history on file. History  Substance Use Topics  . Smoking status: Current Every Day Smoker -- 0.50 packs/day  . Smokeless tobacco: Not on file  . Alcohol Use: No   OB History   Grav Para Term Preterm Abortions TAB SAB Ect Mult Living                 Review of Systems  Eyes: Negative for photophobia, pain, discharge and visual disturbance.  Respiratory: Negative for shortness of breath.   Skin: Positive for rash.  All other systems reviewed and are negative.     Allergies  Review of patient's allergies indicates no known allergies.  Home Medications   Prior to Admission medications   Medication Sig Start Date End Date Taking? Authorizing Provider  azithromycin (ZITHROMAX Z-PAK) 250 MG tablet Take 1 tablet (250 mg total) by mouth daily. 500mg  PO day 1, then 250mg  PO days 205 05/08/12   Kaitlyn Szekalski, PA-C  cyclobenzaprine (FLEXERIL) 10 MG tablet Take 1 tablet (10 mg total) by mouth 3 (three) times daily as needed for muscle spasms. 01/05/13   Karen Chafe Molpus, MD  HYDROcodone-acetaminophen  (NORCO/VICODIN) 5-325 MG per tablet Take 1-2 tablets by mouth every 6 (six) hours as needed for pain. 01/05/13   John L Molpus, MD   BP 141/76  Pulse 86  Temp(Src) 98.2 F (36.8 C) (Oral)  Resp 18  Ht 5\' 7"  (1.702 m)  Wt 250 lb (113.399 kg)  BMI 39.15 kg/m2  SpO2 98%  LMP 07/01/2013 Physical Exam  Constitutional: She is oriented to person, place, and time. She appears well-developed and well-nourished. No distress.  HENT:  Head: Normocephalic and atraumatic.  Right Ear: Hearing normal.  Left Ear: Hearing normal.  Nose: Nose normal.  Mouth/Throat: Oropharynx is clear and moist and mucous membranes are normal.  Eyes: Conjunctivae and EOM are normal. Pupils are equal, round, and reactive to light. Right eye exhibits no chemosis, no discharge and no exudate. No foreign body present in the right eye. Right conjunctiva is not injected. Right conjunctiva has no hemorrhage.    Fluorescein and Wood's lamp exam reveals no corneal uptake - no concern for HSV keratitis, shingles, abrasion, ulceration  Neck: Normal range of motion. Neck supple.  Cardiovascular: Regular rhythm, S1 normal and S2 normal.  Exam reveals no gallop and no friction rub.   No murmur heard. Pulmonary/Chest: Effort normal and breath sounds normal. No respiratory distress. She exhibits no tenderness.  Abdominal: Soft. Normal appearance and bowel sounds are normal. There is no  hepatosplenomegaly. There is no tenderness. There is no rebound, no guarding, no tenderness at McBurney's point and negative Murphy's sign. No hernia.  Musculoskeletal: Normal range of motion.  Neurological: She is alert and oriented to person, place, and time. She has normal strength. No cranial nerve deficit or sensory deficit. Coordination normal. GCS eye subscore is 4. GCS verbal subscore is 5. GCS motor subscore is 6.  Skin: Skin is warm, dry and intact. No rash noted. No cyanosis.     Psychiatric: She has a normal mood and affect. Her speech is  normal and behavior is normal. Thought content normal.    ED Course  Procedures (including critical care time) Labs Review Labs Reviewed - No data to display  Imaging Review No results found.   EKG Interpretation None      MDM   Final diagnoses:  Insect bite  Allergic reaction  possible shingles  Patient presents to the ER for evaluation of swelling on the right side of her face. The area is tender to touch. There is only slight erythema associated with the swelling. This does not appear to be a bacterial infection or cellulitis. She has lesions on her arm which represents significant reactions to some type of insect bite, patient reports similar reactions to mosquito bites in the past. The swelling of the face might be secondary to mosquito bites. Other etiology has to be considered. I related to contact dermatitis is a possibility. Also, shingles is considered. No evidence of corneal abnormality on exam. Patient will be treated with prednisone taper. She'll use will compresses/ice to the face. Benadryl as needed for itching. She was also empirically prescribed acyclovir. He went that this is an early shingles outbreak. Return for worsening symptoms.  Orpah Greek, MD 07/30/13 1149

## 2013-11-26 ENCOUNTER — Encounter (HOSPITAL_BASED_OUTPATIENT_CLINIC_OR_DEPARTMENT_OTHER): Payer: Self-pay | Admitting: Emergency Medicine

## 2013-11-26 ENCOUNTER — Emergency Department (HOSPITAL_BASED_OUTPATIENT_CLINIC_OR_DEPARTMENT_OTHER): Payer: Managed Care, Other (non HMO)

## 2013-11-26 ENCOUNTER — Emergency Department (HOSPITAL_BASED_OUTPATIENT_CLINIC_OR_DEPARTMENT_OTHER)
Admission: EM | Admit: 2013-11-26 | Discharge: 2013-11-26 | Disposition: A | Discharge status: Home / Self Care | Payer: Managed Care, Other (non HMO) | Attending: Emergency Medicine | Admitting: Emergency Medicine

## 2013-11-26 DIAGNOSIS — R059 Cough, unspecified: Secondary | ICD-10-CM | POA: Diagnosis present

## 2013-11-26 DIAGNOSIS — J029 Acute pharyngitis, unspecified: Secondary | ICD-10-CM | POA: Diagnosis not present

## 2013-11-26 DIAGNOSIS — IMO0002 Reserved for concepts with insufficient information to code with codable children: Secondary | ICD-10-CM | POA: Diagnosis not present

## 2013-11-26 DIAGNOSIS — Z8719 Personal history of other diseases of the digestive system: Secondary | ICD-10-CM | POA: Diagnosis not present

## 2013-11-26 DIAGNOSIS — J3489 Other specified disorders of nose and nasal sinuses: Secondary | ICD-10-CM | POA: Diagnosis not present

## 2013-11-26 DIAGNOSIS — Z792 Long term (current) use of antibiotics: Secondary | ICD-10-CM | POA: Insufficient documentation

## 2013-11-26 DIAGNOSIS — Z8701 Personal history of pneumonia (recurrent): Secondary | ICD-10-CM | POA: Diagnosis not present

## 2013-11-26 DIAGNOSIS — R05 Cough: Secondary | ICD-10-CM | POA: Diagnosis present

## 2013-11-26 DIAGNOSIS — Z79899 Other long term (current) drug therapy: Secondary | ICD-10-CM | POA: Insufficient documentation

## 2013-11-26 DIAGNOSIS — F17200 Nicotine dependence, unspecified, uncomplicated: Secondary | ICD-10-CM | POA: Diagnosis not present

## 2013-11-26 DIAGNOSIS — R509 Fever, unspecified: Secondary | ICD-10-CM | POA: Insufficient documentation

## 2013-11-26 HISTORY — DX: Pneumonia, unspecified organism: J18.9

## 2013-11-26 MED ORDER — HYDROCODONE-HOMATROPINE 5-1.5 MG/5ML PO SYRP: 5.0000 mL | ORAL_SOLUTION | Freq: Four times a day (QID) | ORAL | Status: DC | PRN

## 2013-11-26 NOTE — ED Notes (Signed)
Patient states she has had a cough for the last three days.  States the cough is dry and non productive and associated with sore throat.  States she may have had a fever two days ago.  Hx of pneumonia.

## 2013-11-26 NOTE — Discharge Instructions (Signed)
Cough, Adult   A cough is a reflex. It helps you clear your throat and airways. A cough can help heal your body. A cough can last 2 or 3 weeks (acute) or may last more than 8 weeks (chronic). Some common causes of a cough can include an infection, allergy, or a cold.  HOME CARE  · Only take medicine as told by your doctor.  · If given, take your medicines (antibiotics) as told. Finish them even if you start to feel better.  · Use a cold steam vaporizer or humidifier in your home. This can help loosen thick spit (secretions).  · Sleep so you are almost sitting up (semi-upright). Use pillows to do this. This helps reduce coughing.  · Rest as needed.  · Stop smoking if you smoke.  GET HELP RIGHT AWAY IF:  · You have yellowish-white fluid (pus) in your thick spit.  · Your cough gets worse.  · Your medicine does not reduce coughing, and you are losing sleep.  · You cough up blood.  · You have trouble breathing.  · Your pain gets worse and medicine does not help.  · You have a fever.  MAKE SURE YOU:   · Understand these instructions.  · Will watch your condition.  · Will get help right away if you are not doing well or get worse.  Document Released: 12/08/2010 Document Revised: 08/11/2013 Document Reviewed: 12/08/2010  [employer]® Patient Information ©2015 [employer]. This information is not intended to replace advice given to you by your health care provider. Make sure you discuss any questions you have with your health care provider.

## 2013-11-26 NOTE — ED Provider Notes (Signed)
CSN: [id number]     Arrival date & time 11/26/13  1157 History   First MD Initiated Contact with Patient 11/26/13 1223     Chief Complaint  Patient presents with  . Cough     (Consider location/radiation/quality/duration/timing/severity/associated sxs/prior Treatment) Patient is a 48 y.o. female presenting with cough. The history is provided by the patient. No language interpreter was used.  Cough Cough characteristics:  Productive Sputum characteristics:  Yellow Severity:  Moderate Onset quality:  Gradual Duration:  3 days Timing:  Constant Progression:  Worsening Chronicity:  New Smoker: yes   Relieved by:  Nothing Ineffective treatments: otc cold medications. Associated symptoms: fever and sore throat     Past Medical History  Diagnosis Date  . Hernia   . Pneumonia     [id number]   Past Surgical History  Procedure Laterality Date  . Cesarean section    . Hernia repair    . Cesarean section     No family history on file. History  Substance Use Topics  . Smoking status: Current Every Day Smoker -- 0.50 packs/day  . Smokeless tobacco: Not on file  . Alcohol Use: No   OB History   Grav Para Term Preterm Abortions TAB SAB Ect Mult Living                 Review of Systems  Constitutional: Positive for fever.  HENT: Positive for congestion and sore throat.   Respiratory: Positive for cough.   Cardiovascular: Negative.       Allergies  Review of patient's allergies indicates no known allergies.  Home Medications   Prior to Admission medications   Medication Sig Start Date End Date Taking? Authorizing Provider  acyclovir (ZOVIRAX) 800 MG tablet Take 0.5 tablets (400 mg total) by mouth 5 (five) times daily. 07/30/13  Yes Orpah Greek, MD  azithromycin (ZITHROMAX Z-PAK) 250 MG tablet Take 1 tablet (250 mg total) by mouth daily. 500mg  PO day 1, then 250mg  PO days 205 05/08/12  Yes Kaitlyn Szekalski, PA-C  cyclobenzaprine (FLEXERIL) 10 MG tablet Take 1  tablet (10 mg total) by mouth 3 (three) times daily as needed for muscle spasms. 01/05/13  Yes John L Molpus, MD  HYDROcodone-acetaminophen (NORCO/VICODIN) 5-325 MG per tablet Take 1-2 tablets by mouth every 6 (six) hours as needed for pain. 01/05/13  Yes John L Molpus, MD  predniSONE (DELTASONE) 20 MG tablet 3 tabs po daily x 3 days, then 2 tabs x 3 days, then 1.5 tabs x 3 days, then 1 tab x 3 days, then 0.5 tabs x 3 days 07/30/13  Yes Orpah Greek, MD   BP 133/85  Pulse 103  Temp(Src) 98.8 F (37.1 C) (Oral)  Resp 16  Ht 5\' 7"  (1.702 m)  Wt 230 lb (104.327 kg)  BMI 36.01 kg/m2  SpO2 97%  LMP 11/19/2013 Physical Exam  Nursing note and vitals reviewed. Constitutional: She appears well-developed.  HENT:  Head: Normocephalic and atraumatic.  Right Ear: External ear normal.  Left Ear: External ear normal.  Nose: Rhinorrhea present.  Mouth/Throat: Posterior oropharyngeal erythema present.  Cardiovascular: Normal rate and regular rhythm.   Pulmonary/Chest: Effort normal and breath sounds normal.    ED Course  Procedures (including critical care time) Labs Review Labs Reviewed - No data to display  Imaging Review Dg Chest 2 View  11/26/2013   CLINICAL DATA:  Cough.  Chest tightness.  EXAM: CHEST  2 VIEW  COMPARISON:  05/08/2013.  FINDINGS: Mediastinum and hilar  structures normal. Lungs are clear of acute infiltrates. Findings suggesting pleural parenchymal scarring right lung base. Heart size normal. No acute bony abnormality. Chest is stable from prior exam.  IMPRESSION: No active cardiopulmonary disease.  Pleural parenchymal scarring right lung base.  Stable chest.   Electronically Signed   By: Marcello Moores  Register   On: 11/26/2013 12:57     EKG Interpretation None      MDM   Final diagnoses:  Cough    No infection noted on x-ray. Will treat symptomatically for cough   Glendell Docker, NP 11/26/13 1402

## 2013-11-30 NOTE — ED Provider Notes (Signed)
Medical screening examination/treatment/procedure(s) were performed by non-physician practitioner and as supervising physician I was immediately available for consultation/collaboration.   Dot Lanes, MD 11/30/13 [phone]

## 2013-12-01 ENCOUNTER — Emergency Department (HOSPITAL_BASED_OUTPATIENT_CLINIC_OR_DEPARTMENT_OTHER)
Admission: EM | Admit: 2013-12-01 | Discharge: 2013-12-01 | Disposition: A | Discharge status: Home / Self Care | Payer: Managed Care, Other (non HMO) | Attending: Emergency Medicine | Admitting: Emergency Medicine

## 2013-12-01 ENCOUNTER — Encounter (HOSPITAL_BASED_OUTPATIENT_CLINIC_OR_DEPARTMENT_OTHER): Payer: Self-pay | Admitting: Emergency Medicine

## 2013-12-01 DIAGNOSIS — R062 Wheezing: Secondary | ICD-10-CM

## 2013-12-01 DIAGNOSIS — Z79899 Other long term (current) drug therapy: Secondary | ICD-10-CM | POA: Diagnosis not present

## 2013-12-01 DIAGNOSIS — F17200 Nicotine dependence, unspecified, uncomplicated: Secondary | ICD-10-CM | POA: Diagnosis not present

## 2013-12-01 DIAGNOSIS — R05 Cough: Secondary | ICD-10-CM | POA: Diagnosis present

## 2013-12-01 DIAGNOSIS — J209 Acute bronchitis, unspecified: Secondary | ICD-10-CM | POA: Diagnosis not present

## 2013-12-01 DIAGNOSIS — Z8701 Personal history of pneumonia (recurrent): Secondary | ICD-10-CM | POA: Diagnosis not present

## 2013-12-01 DIAGNOSIS — Z8719 Personal history of other diseases of the digestive system: Secondary | ICD-10-CM | POA: Insufficient documentation

## 2013-12-01 DIAGNOSIS — R059 Cough, unspecified: Secondary | ICD-10-CM | POA: Insufficient documentation

## 2013-12-01 DIAGNOSIS — J40 Bronchitis, not specified as acute or chronic: Secondary | ICD-10-CM

## 2013-12-01 MED ORDER — PREDNISONE 20 MG PO TABS: 60.0000 mg | ORAL_TABLET | Freq: Every day | ORAL | Status: DC

## 2013-12-01 MED ORDER — ALBUTEROL SULFATE (2.5 MG/3ML) 0.083% IN NEBU: 5.0000 mg | INHALATION_SOLUTION | Freq: Once | RESPIRATORY_TRACT | Status: DC

## 2013-12-01 MED ORDER — AEROCHAMBER PLUS FLO-VU MEDIUM MISC
1.0000 | Freq: Once | Status: AC
  Administered 2013-12-01: 1
  Filled 2013-12-01: qty 1

## 2013-12-01 MED ORDER — PREDNISONE 50 MG PO TABS
60.0000 mg | ORAL_TABLET | Freq: Once | ORAL | Status: AC
  Administered 2013-12-01: 60 mg via ORAL
  Filled 2013-12-01 (×2): qty 1

## 2013-12-01 MED ORDER — AZITHROMYCIN 250 MG PO TABS: ORAL_TABLET | ORAL | Status: AC

## 2013-12-01 MED ORDER — ALBUTEROL SULFATE HFA 108 (90 BASE) MCG/ACT IN AERS
2.0000 | INHALATION_SPRAY | RESPIRATORY_TRACT | Status: DC | PRN
  Administered 2013-12-01: 2 via RESPIRATORY_TRACT

## 2013-12-01 MED ORDER — ALBUTEROL SULFATE HFA 108 (90 BASE) MCG/ACT IN AERS
INHALATION_SPRAY | RESPIRATORY_TRACT | Status: AC
  Filled 2013-12-01: qty 6.7

## 2013-12-01 MED ORDER — IPRATROPIUM-ALBUTEROL 0.5-2.5 (3) MG/3ML IN SOLN
3.0000 mL | RESPIRATORY_TRACT | Status: DC
  Administered 2013-12-01 (×2): 3 mL via RESPIRATORY_TRACT
  Filled 2013-12-01 (×2): qty 3

## 2013-12-01 NOTE — ED Provider Notes (Signed)
CSN: [id number]     Arrival date & time 12/01/13  1659 History   This chart was scribed for Shaune Pollack, MD by Ladene Artist, ED Scribe. The patient was seen in room MH11/MH11. Patient's care was started at 5:25 PM.   Chief Complaint  Patient presents with  . Cough   Patient is a 48 y.o. female presenting with cough. The history is provided by the patient. No language interpreter was used.  Cough Severity:  Mild Duration:  1 week Timing:  Constant Progression:  Unchanged Chronicity:  Recurrent Smoker: yes   Relieved by:  Nothing Ineffective treatments:  Cough suppressants and steam Associated symptoms: rhinorrhea (resolved), shortness of breath (mild), sinus congestion (resolved) and wheezing   Associated symptoms: no fever   HPI Comments: Jasmin Stewart is a 48 y.o. female who presents to the Emergency Department complaining of constant non-productive cough onset 8 days ago. Pt reports rhinorrhea, congestion and other cold-like symptoms onset 11/23/13 when she first noted cough; all other symptoms have resolved. She denies fever. Pt was seen 11/26/13 for cough and prescribed Hydrocodone cough syrup. CXR was obtained that was normal. Pt states that she has taken the cough syrup as prescribed, Vicks Vapor Rub, warm tea with honey, and humidifier with no relief.   Past Medical History  Diagnosis Date  . Hernia   . Pneumonia     [id number]   Past Surgical History  Procedure Laterality Date  . Cesarean section    . Hernia repair    . Cesarean section     No family history on file. History  Substance Use Topics  . Smoking status: Current Every Day Smoker -- 0.50 packs/day  . Smokeless tobacco: Not on file  . Alcohol Use: No   OB History   Grav Para Term Preterm Abortions TAB SAB Ect Mult Living                 Review of Systems  Constitutional: Negative for fever.  HENT: Positive for rhinorrhea (resolved).   Respiratory: Positive for cough, shortness of breath (mild) and  wheezing.   All other systems reviewed and are negative.  Allergies  Review of patient's allergies indicates no known allergies.  Home Medications   Prior to Admission medications   Medication Sig Start Date End Date Taking? Authorizing Provider  acyclovir (ZOVIRAX) 800 MG tablet Take 0.5 tablets (400 mg total) by mouth 5 (five) times daily. 07/30/13   Orpah Greek, MD  azithromycin (ZITHROMAX Z-PAK) 250 MG tablet Take 1 tablet (250 mg total) by mouth daily. 500mg  PO day 1, then 250mg  PO days 205 05/08/12   Kaitlyn Szekalski, PA-C  cyclobenzaprine (FLEXERIL) 10 MG tablet Take 1 tablet (10 mg total) by mouth 3 (three) times daily as needed for muscle spasms. 01/05/13   Karen Chafe Molpus, MD  HYDROcodone-acetaminophen (NORCO/VICODIN) 5-325 MG per tablet Take 1-2 tablets by mouth every 6 (six) hours as needed for pain. 01/05/13   John L Molpus, MD  HYDROcodone-homatropine (HYDROMET) 5-1.5 MG/5ML syrup Take 5 mLs by mouth every 6 (six) hours as needed for cough. 11/26/13   Glendell Docker, NP  predniSONE (DELTASONE) 20 MG tablet 3 tabs po daily x 3 days, then 2 tabs x 3 days, then 1.5 tabs x 3 days, then 1 tab x 3 days, then 0.5 tabs x 3 days 07/30/13   Orpah Greek, MD   Triage Vitals: BP 129/89  Pulse 110  Temp(Src) 99.9 F (37.7 C) (Oral)  Resp  24  Ht 5\' 7"  (1.702 m)  Wt 240 lb (108.863 kg)  BMI 37.58 kg/m2  SpO2 95%  LMP 11/19/2013 Physical Exam  Nursing note and vitals reviewed. Constitutional: She is oriented to person, place, and time. She appears well-developed and well-nourished.  HENT:  Head: Normocephalic and atraumatic.  Right Ear: External ear normal.  Left Ear: External ear normal.  Nose: Nose normal.  Mouth/Throat: Oropharynx is clear and moist.  Eyes: Conjunctivae and EOM are normal. Pupils are equal, round, and reactive to light.  Neck: Normal range of motion. Neck supple. No JVD present. No tracheal deviation present. No thyromegaly present.   Cardiovascular: Normal rate, regular rhythm, normal heart sounds and intact distal pulses.   Pulmonary/Chest: Effort normal. No respiratory distress. She has wheezes.  Abdominal: Soft. Bowel sounds are normal. She exhibits no mass. There is no tenderness. There is no guarding.  Musculoskeletal: Normal range of motion.  Lymphadenopathy:    She has no cervical adenopathy.  Neurological: She is alert and oriented to person, place, and time. She has normal reflexes. No cranial nerve deficit or sensory deficit. Gait normal. GCS eye subscore is 4. GCS verbal subscore is 5. GCS motor subscore is 6.  Reflex Scores:      Bicep reflexes are 2+ on the right side and 2+ on the left side.      Patellar reflexes are 2+ on the right side and 2+ on the left side. Strength is 5/5 bilateral elbow flexor/extensors, wrist extension/flexion, intrinsic hand strength equal Bilateral hip flexion/extension 5/5, knee flexion/extension 5/5, ankle 5/5 flexion extension  Skin: Skin is warm and dry.  Psychiatric: She has a normal mood and affect. Her behavior is normal. Judgment and thought content normal.   ED Course  Procedures (including critical care time) DIAGNOSTIC STUDIES: Oxygen Saturation is 95% on RA, adequate by my interpretation.    COORDINATION OF CARE: 5:30 PM-Discussed treatment plan which includes nebulizer treatment with pt at bedside and pt agreed to plan.   Labs Review Labs Reviewed - No data to display  Imaging Review No results found.   EKG Interpretation None     Patient improved with albuterol neb,  Wheezing resolved.  Patient counseledl regarding smoking.  Plan albuterol mdi, prednisone and zithromax  MDM   Final diagnoses:  Bronchitis  Wheezing  I personally performed the services described in this documentation, which was scribed in my presence. The recorded information has been reviewed and considered.     Shaune Pollack, MD 12/01/13 2026

## 2013-12-01 NOTE — ED Notes (Signed)
See last week for cough-states cough is no better with rx med

## 2013-12-01 NOTE — Discharge Instructions (Signed)
How to Use an Inhaler Using your inhaler correctly is very important. Good technique will make sure that the medicine reaches your lungs.  HOW TO USE AN INHALER: 1. Take the cap off the inhaler. 2. If this is the first time using your inhaler, you need to prime it. Shake the inhaler for 5 seconds. Release four puffs into the air, away from your face. Ask your doctor for help if you have questions. 3. Shake the inhaler for 5 seconds. 4. Turn the inhaler so the bottle is above the mouthpiece. 5. Put your pointer finger on top of the bottle. Your thumb holds the bottom of the inhaler. 6. Open your mouth. 7. Either hold the inhaler away from your mouth (the width of 2 fingers) or place your lips tightly around the mouthpiece. Ask your doctor which way to use your inhaler. 8. Breathe out as much air as possible. 9. Breathe in and push down on the bottle 1 time to release the medicine. You will feel the medicine go in your mouth and throat. 10. Continue to take a deep breath in very slowly. Try to fill your lungs. 11. After you have breathed in completely, hold your breath for 10 seconds. This will help the medicine to settle in your lungs. If you cannot hold your breath for 10 seconds, hold it for as long as you can before you breathe out. 12. Breathe out slowly, through pursed lips. Whistling is an example of pursed lips. 13. If your doctor has told you to take more than 1 puff, wait at least 15-30 seconds between puffs. This will help you get the best results from your medicine. Do not use the inhaler more than your doctor tells you to. 14. Put the cap back on the inhaler. 15. Follow the directions from your doctor or from the inhaler package about cleaning the inhaler. If you use more than one inhaler, ask your doctor which inhalers to use and what order to use them in. Ask your doctor to help you figure out when you will need to refill your inhaler.  If you use a steroid inhaler, always rinse your  mouth with water after your last puff, gargle and spit out the water. Do not swallow the water. GET HELP IF:  The inhaler medicine only partially helps to stop wheezing or shortness of breath.  You are having trouble using your inhaler.  You have some increase in thick spit (phlegm). GET HELP RIGHT AWAY IF:  The inhaler medicine does not help your wheezing or shortness of breath or you have tightness in your chest.  You have dizziness, headaches, or fast heart rate.  You have chills, fever, or night sweats.  You have a large increase of thick spit, or your thick spit is bloody. MAKE SURE YOU:   Understand these instructions.  Will watch your condition.  Will get help right away if you are not doing well or get worse. Document Released: 01/04/2008 Document Revised: 01/15/2013 Document Reviewed: 10/24/2012 [facility] Patient Information 2015 [place], [place]. This information is not intended to replace advice given to you by your health care provider. Make sure you discuss any questions you have with your health care provider. Acute Bronchitis Bronchitis is inflammation of the airways that extend from the windpipe into the lungs (bronchi). The inflammation often causes mucus to develop. This leads to a cough, which is the most common symptom of bronchitis.  In acute bronchitis, the condition usually develops suddenly and goes away over  time, usually in a couple weeks. Smoking, allergies, and asthma can make bronchitis worse. Repeated episodes of bronchitis may cause further lung problems.  CAUSES Acute bronchitis is most often caused by the same virus that causes a cold. The virus can spread from person to person (contagious) through coughing, sneezing, and touching contaminated objects. SIGNS AND SYMPTOMS   Cough.   Fever.   Coughing up mucus.   Body aches.   Chest congestion.   Chills.   Shortness of breath.   Sore throat.  DIAGNOSIS  Acute bronchitis is usually  diagnosed through a physical exam. Your health care provider will also ask you questions about your medical history. Tests, such as chest X-rays, are sometimes done to rule out other conditions.  TREATMENT  Acute bronchitis usually goes away in a couple weeks. Oftentimes, no medical treatment is necessary. Medicines are sometimes given for relief of fever or cough. Antibiotic medicines are usually not needed but may be prescribed in certain situations. In some cases, an inhaler may be recommended to help reduce shortness of breath and control the cough. A cool mist vaporizer may also be used to help thin bronchial secretions and make it easier to clear the chest.  HOME CARE INSTRUCTIONS  Get plenty of rest.   Drink enough fluids to keep your urine clear or pale yellow (unless you have a medical condition that requires fluid restriction). Increasing fluids may help thin your respiratory secretions (sputum) and reduce chest congestion, and it will prevent dehydration.   Take medicines only as directed by your health care provider.  If you were prescribed an antibiotic medicine, finish it all even if you start to feel better.  Avoid smoking and secondhand smoke. Exposure to cigarette smoke or irritating chemicals will make bronchitis worse. If you are a smoker, consider using nicotine gum or skin patches to help control withdrawal symptoms. Quitting smoking will help your lungs heal faster.   Reduce the chances of another bout of acute bronchitis by washing your hands frequently, avoiding people with cold symptoms, and trying not to touch your hands to your mouth, nose, or eyes.   Keep all follow-up visits as directed by your health care provider.  SEEK MEDICAL CARE IF: Your symptoms do not improve after 1 week of treatment.  SEEK IMMEDIATE MEDICAL CARE IF:  You develop an increased fever or chills.   You have chest pain.   You have severe shortness of breath.  You have bloody sputum.    You develop dehydration.  You faint or repeatedly feel like you are going to pass out.  You develop repeated vomiting.  You develop a severe headache. MAKE SURE YOU:   Understand these instructions.  Will watch your condition.  Will get help right away if you are not doing well or get worse. Document Released: 05/04/2004 Document Revised: 08/11/2013 Document Reviewed: 09/17/2012 [facility] Patient Information 2015 [place], [place]. This information is not intended to replace advice given to you by your health care provider. Make sure you discuss any questions you have with your health care provider.

## 2013-12-11 NOTE — ED Notes (Signed)
Patient called to state that she is not feeling any better after two visits to the ED.  Requested to speak with the physician. Chart reviewed by Dr. Jeanell Sparrow and order received for diflucan 2gm po dispense one table, take one table one time.  No refills.  Also, to have patient follow up with a pcp.  Prescription called to [facility] @ [phone].  Returned call to the patient and updated on discharge instructions and expectations for viral infections, cough could last up to 5 weeks.  Info given for [facility] and [facility].

## 2014-07-22 ENCOUNTER — Encounter (HOSPITAL_BASED_OUTPATIENT_CLINIC_OR_DEPARTMENT_OTHER): Payer: Self-pay | Admitting: Emergency Medicine

## 2014-07-22 ENCOUNTER — Emergency Department (HOSPITAL_BASED_OUTPATIENT_CLINIC_OR_DEPARTMENT_OTHER)
Admission: EM | Admit: 2014-07-22 | Discharge: 2014-07-22 | Disposition: A | Discharge status: Home / Self Care | Payer: Managed Care, Other (non HMO) | Attending: Emergency Medicine | Admitting: Emergency Medicine

## 2014-07-22 DIAGNOSIS — J029 Acute pharyngitis, unspecified: Secondary | ICD-10-CM

## 2014-07-22 DIAGNOSIS — J209 Acute bronchitis, unspecified: Secondary | ICD-10-CM | POA: Insufficient documentation

## 2014-07-22 DIAGNOSIS — Z792 Long term (current) use of antibiotics: Secondary | ICD-10-CM | POA: Insufficient documentation

## 2014-07-22 DIAGNOSIS — Z7952 Long term (current) use of systemic steroids: Secondary | ICD-10-CM | POA: Insufficient documentation

## 2014-07-22 DIAGNOSIS — Z8701 Personal history of pneumonia (recurrent): Secondary | ICD-10-CM | POA: Insufficient documentation

## 2014-07-22 DIAGNOSIS — Z8719 Personal history of other diseases of the digestive system: Secondary | ICD-10-CM | POA: Insufficient documentation

## 2014-07-22 DIAGNOSIS — Z79899 Other long term (current) drug therapy: Secondary | ICD-10-CM | POA: Insufficient documentation

## 2014-07-22 DIAGNOSIS — Z720 Tobacco use: Secondary | ICD-10-CM | POA: Insufficient documentation

## 2014-07-22 LAB — RAPID STREP SCREEN (MED CTR MEBANE ONLY): Streptococcus, Group A Screen (Direct): NEGATIVE

## 2014-07-22 MED ORDER — HYDROCODONE-ACETAMINOPHEN 7.5-325 MG/15ML PO SOLN: 10.0000 mL | ORAL | Status: DC | PRN

## 2014-07-22 NOTE — ED Notes (Signed)
Patient states that she started to have a sore throat with fever yesterday

## 2014-07-22 NOTE — ED Provider Notes (Signed)
CSN: [id number]     Arrival date & time 07/22/14  0606 History   First MD Initiated Contact with Patient 07/22/14 [phone]     Chief Complaint  Patient presents with  . Sore Throat     (Consider location/radiation/quality/duration/timing/severity/associated sxs/prior Treatment) HPI  This is a 49 year old female with a one-day history of a sore throat. She is having moderate to severe pain in her throat, worse with swallowing. It is relieved when drinking hot drinks. She has had associated fever and body aches. She has been treating the fever with Tylenol. She denies nasal congestion, cough or shortness of breath. She has a history of tonsillitis but has not had a flare in 8 years.  Past Medical History  Diagnosis Date  . Hernia   . Pneumonia     7564,3329   Past Surgical History  Procedure Laterality Date  . Cesarean section    . Hernia repair    . Cesarean section     History reviewed. No pertinent family history. History  Substance Use Topics  . Smoking status: Current Every Day Smoker -- 0.50 packs/day  . Smokeless tobacco: Not on file  . Alcohol Use: No   OB History    No data available     Review of Systems  All other systems reviewed and are negative.   Allergies  Review of patient's allergies indicates no known allergies.  Home Medications   Prior to Admission medications   Medication Sig Start Date End Date Taking? Authorizing Provider  acyclovir (ZOVIRAX) 800 MG tablet Take 0.5 tablets (400 mg total) by mouth 5 (five) times daily. 07/30/13   Orpah Greek, MD  azithromycin (ZITHROMAX Z-PAK) 250 MG tablet Take 1 tablet (250 mg total) by mouth daily. 500mg  PO day 1, then 250mg  PO days 205 05/08/12   Kaitlyn Szekalski, PA-C  cyclobenzaprine (FLEXERIL) 10 MG tablet Take 1 tablet (10 mg total) by mouth 3 (three) times daily as needed for muscle spasms. 01/05/13   Salvador Bigbee, MD  HYDROcodone-acetaminophen (NORCO/VICODIN) 5-325 MG per tablet Take 1-2 tablets by  mouth every 6 (six) hours as needed for pain. 01/05/13   Camp Gopal, MD  HYDROcodone-homatropine (HYDROMET) 5-1.5 MG/5ML syrup Take 5 mLs by mouth every 6 (six) hours as needed for cough. 11/26/13   Glendell Docker, NP  predniSONE (DELTASONE) 20 MG tablet 3 tabs po daily x 3 days, then 2 tabs x 3 days, then 1.5 tabs x 3 days, then 1 tab x 3 days, then 0.5 tabs x 3 days 07/30/13   Orpah Greek, MD  predniSONE (DELTASONE) 20 MG tablet Take 3 tablets (60 mg total) by mouth daily. 12/01/13   Pattricia Boss, MD   BP 126/81 mmHg  Pulse 123  Temp(Src) 100.1 F (37.8 C) (Oral)  Resp 18  Ht 5' 7.5" (1.715 m)  Wt 275 lb (124.739 kg)  BMI 42.41 kg/m2  SpO2 98%  LMP 07/21/2014   Physical Exam  General: Well-developed, well-nourished female in no acute distress; appearance consistent with age of record HENT: normocephalic; atraumatic; pharyngeal erythema without exudate or tonsillar enlargement; uvula midline; no trismus Eyes: pupils equal, round and reactive to light; extraocular muscles intact Neck: supple Heart: regular rate and rhythm; tachycardia Lungs: clear to auscultation bilaterally Abdomen: soft; nondistended; nontender; bowel sounds present Extremities: No deformity; full range of motion; pulses normal Neurologic: Awake, alert and oriented; motor function intact in all extremities and symmetric; no facial droop Skin: Warm and dry Psychiatric: Normal mood and affect  ED Course  Procedures (including critical care time)   MDM  Nursing notes and vitals signs, including pulse oximetry, reviewed.  Summary of this visit's results, reviewed by myself:  Labs:  Results for orders placed or performed during the hospital encounter of 07/22/14 (from the past 24 hour(s))  Rapid strep screen     Status: None   Collection Time: 07/22/14  6:15 AM  Result Value Ref Range   Streptococcus, Group A Screen (Direct) NEGATIVE NEGATIVE       Shanon Rosser, MD 07/22/14 [phone]

## 2014-07-23 ENCOUNTER — Emergency Department (HOSPITAL_BASED_OUTPATIENT_CLINIC_OR_DEPARTMENT_OTHER)
Admission: EM | Admit: 2014-07-23 | Discharge: 2014-07-23 | Disposition: A | Discharge status: Home / Self Care | Payer: Managed Care, Other (non HMO) | Attending: Emergency Medicine | Admitting: Emergency Medicine

## 2014-07-23 ENCOUNTER — Encounter (HOSPITAL_BASED_OUTPATIENT_CLINIC_OR_DEPARTMENT_OTHER): Payer: Self-pay

## 2014-07-23 DIAGNOSIS — J029 Acute pharyngitis, unspecified: Secondary | ICD-10-CM | POA: Insufficient documentation

## 2014-07-23 DIAGNOSIS — Z720 Tobacco use: Secondary | ICD-10-CM | POA: Insufficient documentation

## 2014-07-23 DIAGNOSIS — Z8719 Personal history of other diseases of the digestive system: Secondary | ICD-10-CM | POA: Insufficient documentation

## 2014-07-23 DIAGNOSIS — Z8701 Personal history of pneumonia (recurrent): Secondary | ICD-10-CM | POA: Insufficient documentation

## 2014-07-23 MED ORDER — PENICILLIN G BENZATHINE 1200000 UNIT/2ML IM SUSP
1.2000 10*6.[IU] | Freq: Once | INTRAMUSCULAR | Status: AC
  Administered 2014-07-23: 1.2 10*6.[IU] via INTRAMUSCULAR
  Filled 2014-07-23: qty 2

## 2014-07-23 MED ORDER — DEXAMETHASONE SODIUM PHOSPHATE 10 MG/ML IJ SOLN
10.0000 mg | Freq: Once | INTRAMUSCULAR | Status: AC
  Administered 2014-07-23: 10 mg via INTRAMUSCULAR

## 2014-07-23 MED ORDER — DEXAMETHASONE SODIUM PHOSPHATE 10 MG/ML IJ SOLN
10.0000 mg | Freq: Once | INTRAMUSCULAR | Status: DC
  Filled 2014-07-23: qty 1

## 2014-07-23 NOTE — ED Notes (Signed)
MD at bedside. 

## 2014-07-23 NOTE — Discharge Instructions (Signed)
Return to the ED with any concerns including difficulty breathing or swallowing, vomiting and not able to keep down liquids, decreased level of alertness/lethargy, or any other alarming symptoms

## 2014-07-23 NOTE — ED Notes (Signed)
Sore throat that is described as severe and having difficulty swallowing.  Fever of 101 also last night.  Seen yesterday for same and states Hydrocodone not working.

## 2014-07-23 NOTE — ED Provider Notes (Signed)
CSN: [id number]     Arrival date & time 07/23/14  0623 History   First MD Initiated Contact with Patient 07/23/14 0957     Chief Complaint  Patient presents with  . Sore Throat     (Consider location/radiation/quality/duration/timing/severity/associated sxs/prior Treatment) HPI  Pt presents with c/o sore throat pain.  Pt was seen 2 nights ago in the ED for pain as well.  Pain has increased, is bilateral.  She is taking 400mg  ibuprofen every 6 hours- she states this helps relieve the pain somewhat but wears off before another dose is due.  She was given rx for lortab and states this does not help with the pain but makes her sleepy.  No vomiting.  She is able to drink liquids, but has not been able to eat solid foods due to the pain.  tmax 102.  Rapid strep was negative and throat culture negative from visit 2 days ago.  No vomiting.  There are no other associated systemic symptoms, there are no other alleviating or modifying factors.   Past Medical History  Diagnosis Date  . Hernia   . Pneumonia     7628,3151   Past Surgical History  Procedure Laterality Date  . Cesarean section    . Hernia repair    . Cesarean section     No family history on file. History  Substance Use Topics  . Smoking status: Current Every Day Smoker -- 0.50 packs/day  . Smokeless tobacco: Not on file  . Alcohol Use: No   OB History    No data available     Review of Systems  ROS reviewed and all otherwise negative except for mentioned in HPI    Allergies  Review of patient's allergies indicates no known allergies.  Home Medications   Prior to Admission medications   Medication Sig Start Date End Date Taking? Authorizing Provider  HYDROcodone-acetaminophen (HYCET) 7.5-325 mg/15 ml solution Take 10 mLs by mouth every 4 (four) hours as needed (for pain). 07/22/14   John Molpus, MD   BP 106/63 mmHg  Pulse 77  Temp(Src) 98.8 F (37.1 C) (Oral)  Resp 16  Ht 5\' 7"  (1.702 m)  Wt 275 lb (124.739 kg)   BMI 43.06 kg/m2  SpO2 98%  LMP 07/21/2014  Vitals reviewed Physical Exam  Physical Examination: General appearance - alert, well appearing, and in no distress Mental status - alert, oriented to person, place, and time Eyes - no conjunctival injection, no scleral icterus Mouth - mucous membranes moist, OP with moderate erythema, palate symmetric, uvula midline, no exudate Neck - supple, no significant adenopathy Chest - clear to auscultation, no wheezes, rales or rhonchi, symmetric air entry Heart - normal rate, regular rhythm, normal S1, S2, no murmurs, rubs, clicks or gallops Abdomen - soft, nontender, nondistended, no masses or organomegaly Extremities - peripheral pulses normal, no pedal edema, no clubbing or cyanosis Skin - normal coloration and turgor, no rashes  ED Course  Procedures (including critical care time) Labs Review Labs Reviewed - No data to display  Imaging Review No results found.   EKG Interpretation None      MDM   Final diagnoses:  Pharyngitis    Pt presenting with c/o throat pain.  No asssymetry of OP to suggest PTA, normal voice.  She has been taking 400mg  ibuprofen, she has taken one dose of lortab.  D/w patient that she can take 800 ibuprofen, lortab every 4 hours, will give decadron and also rocephin.  Strep culture  negative.  Discharged with strict return precautions.  Pt agreeable with plan.    Alfonzo Beers, MD 07/25/14 [phone]

## 2014-07-23 NOTE — ED Notes (Signed)
Pt came out asking "did they forget about me?" Apologized and informed patient of the wait. Pt encouraged that MD would be there soon.

## 2014-07-24 LAB — CULTURE, GROUP A STREP: Strep A Culture: NEGATIVE

## 2014-12-01 ENCOUNTER — Emergency Department (HOSPITAL_BASED_OUTPATIENT_CLINIC_OR_DEPARTMENT_OTHER): Payer: BLUE CROSS/BLUE SHIELD

## 2014-12-01 ENCOUNTER — Emergency Department (HOSPITAL_BASED_OUTPATIENT_CLINIC_OR_DEPARTMENT_OTHER)
Admission: EM | Admit: 2014-12-01 | Discharge: 2014-12-01 | Disposition: A | Discharge status: Home / Self Care | Payer: BLUE CROSS/BLUE SHIELD | Attending: Emergency Medicine | Admitting: Emergency Medicine

## 2014-12-01 ENCOUNTER — Encounter (HOSPITAL_BASED_OUTPATIENT_CLINIC_OR_DEPARTMENT_OTHER): Payer: Self-pay

## 2014-12-01 DIAGNOSIS — J159 Unspecified bacterial pneumonia: Secondary | ICD-10-CM | POA: Insufficient documentation

## 2014-12-01 DIAGNOSIS — Z720 Tobacco use: Secondary | ICD-10-CM | POA: Insufficient documentation

## 2014-12-01 DIAGNOSIS — Z8719 Personal history of other diseases of the digestive system: Secondary | ICD-10-CM | POA: Diagnosis not present

## 2014-12-01 DIAGNOSIS — R059 Cough, unspecified: Secondary | ICD-10-CM

## 2014-12-01 DIAGNOSIS — R05 Cough: Secondary | ICD-10-CM

## 2014-12-01 DIAGNOSIS — J189 Pneumonia, unspecified organism: Secondary | ICD-10-CM

## 2014-12-01 MED ORDER — AZITHROMYCIN 250 MG PO TABS: 250.0000 mg | ORAL_TABLET | Freq: Every day | ORAL | Status: DC

## 2014-12-01 MED ORDER — HYDROCOD POLST-CPM POLST ER 10-8 MG/5ML PO SUER: 5.0000 mL | Freq: Two times a day (BID) | ORAL | Status: DC | PRN

## 2014-12-01 NOTE — ED Provider Notes (Signed)
CSN: [id number]     Arrival date & time 12/01/14  1132 History   First MD Initiated Contact with Patient 12/01/14 1142     Chief Complaint  Patient presents with  . Cough     (Consider location/radiation/quality/duration/timing/severity/associated sxs/prior Treatment) Patient is a 49 y.o. female presenting with cough.  Cough Cough characteristics:  Productive Sputum characteristics:  Green Severity:  Moderate Onset quality:  Gradual Duration:  1 day Timing:  Constant Progression:  Worsening Chronicity:  Recurrent Smoker: yes   Context: upper respiratory infection   Relieved by:  Nothing Worsened by:  Nothing tried Associated symptoms: chills   Associated symptoms: no chest pain, no fever, no rash, no rhinorrhea and no shortness of breath     Past Medical History  Diagnosis Date  . Hernia   . Pneumonia     8469,6295   Past Surgical History  Procedure Laterality Date  . Cesarean section    . Hernia repair    . Cesarean section     No family history on file. Social History  Substance Use Topics  . Smoking status: Current Every Day Smoker -- 0.50 packs/day  . Smokeless tobacco: None  . Alcohol Use: No   OB History    No data available     Review of Systems  Constitutional: Positive for chills. Negative for fever.  HENT: Negative for rhinorrhea.   Respiratory: Positive for cough. Negative for shortness of breath.   Cardiovascular: Negative for chest pain.  Skin: Negative for rash.  All other systems reviewed and are negative.     Allergies  Review of patient's allergies indicates no known allergies.  Home Medications   Prior to Admission medications   Medication Sig Start Date End Date Taking? Authorizing Provider  azithromycin (ZITHROMAX) 250 MG tablet Take 1 tablet (250 mg total) by mouth daily. Take first 2 tablets together, then 1 every day until finished. 12/01/14   Debby Freiberg, MD  chlorpheniramine-HYDROcodone [facility] PENNKINETIC ER) 10-8  MG/5ML SUER Take 5 mLs by mouth every 12 (twelve) hours as needed for cough. 12/01/14   Debby Freiberg, MD   BP 130/70 mmHg  Pulse 90  Temp(Src) 98.9 F (37.2 C) (Oral)  Resp 16  Ht 5\' 7"  (1.702 m)  Wt 235 lb (106.595 kg)  BMI 36.80 kg/m2  SpO2 98%  LMP 11/09/2014 Physical Exam  Constitutional: She is oriented to person, place, and time. She appears well-developed and well-nourished.  HENT:  Head: Normocephalic and atraumatic.  Right Ear: External ear normal.  Left Ear: External ear normal.  Eyes: Conjunctivae and EOM are normal. Pupils are equal, round, and reactive to light.  Neck: Normal range of motion. Neck supple.  Cardiovascular: Normal rate, regular rhythm, normal heart sounds and intact distal pulses.   Pulmonary/Chest: Effort normal. She has wheezes (mild end expiratory).  Abdominal: Soft. Bowel sounds are normal. There is no tenderness.  Musculoskeletal: Normal range of motion.  Neurological: She is alert and oriented to person, place, and time.  Skin: Skin is warm and dry.  Vitals reviewed.   ED Course  Procedures (including critical care time) Labs Review Labs Reviewed - No data to display  Imaging Review Dg Chest 2 View  12/01/2014   CLINICAL DATA:  Cough and congestion since yesterday, history smoking and pneumonia  EXAM: CHEST  2 VIEW  COMPARISON:  11/26/2013, 05/08/2012  FINDINGS: Normal heart size, mediastinal contours, and pulmonary vascularity.  Central peribronchial thickening.  Linear subsegmental atelectasis at lingula.  On the  lateral view however, it increased opacity is seen in the anterior lung bases versus prior exams concerning for subtle lingular infiltrate.  Remaining lungs clear.  No pleural effusion or pneumothorax.  Calcification adjacent to LEFT humeral head question chronic calcific tendinitis of the LEFT rotator cuff.  IMPRESSION: Bronchitic changes with subsegmental atelectasis and question consolidation in lingula.  Followup PA and lateral  chest X-ray is recommended in 3-4 weeks following trial of antibiotic therapy to ensure resolution and exclude underlying malignancy.   Electronically Signed   By: Lavonia Dana M.D.   On: 12/01/2014 12:39   I have personally reviewed and evaluated these images and lab results as part of my medical decision-making.   EKG Interpretation None      MDM   Final diagnoses:  Cough  CAP (community acquired pneumonia)    49 y.o. female with pertinent PMH of prior PNA presents with recurrent cough, malaise. Symptoms when present for one day. On arrival vital signs and physical exam as above. Workup revealed pneumonia. Community-acquired pneumonia is etiology the patient's symptoms. Discharged home with azithromycin, Tussionex.    I have reviewed all laboratory and imaging studies if ordered as above  1. Cough   2. CAP (community acquired pneumonia)         Debby Freiberg, MD 12/01/14 1406

## 2014-12-01 NOTE — ED Notes (Signed)
Prod cough-started yesterday-pt NAD

## 2014-12-01 NOTE — Discharge Instructions (Signed)

## 2015-01-05 ENCOUNTER — Encounter (HOSPITAL_BASED_OUTPATIENT_CLINIC_OR_DEPARTMENT_OTHER): Payer: Self-pay | Admitting: *Deleted

## 2015-01-05 ENCOUNTER — Emergency Department (HOSPITAL_BASED_OUTPATIENT_CLINIC_OR_DEPARTMENT_OTHER)
Admission: EM | Admit: 2015-01-05 | Discharge: 2015-01-05 | Disposition: A | Discharge status: Home / Self Care | Payer: BLUE CROSS/BLUE SHIELD | Attending: Emergency Medicine | Admitting: Emergency Medicine

## 2015-01-05 DIAGNOSIS — Z8701 Personal history of pneumonia (recurrent): Secondary | ICD-10-CM | POA: Diagnosis not present

## 2015-01-05 DIAGNOSIS — Z8719 Personal history of other diseases of the digestive system: Secondary | ICD-10-CM | POA: Diagnosis not present

## 2015-01-05 DIAGNOSIS — R42 Dizziness and giddiness: Secondary | ICD-10-CM | POA: Insufficient documentation

## 2015-01-05 DIAGNOSIS — Z720 Tobacco use: Secondary | ICD-10-CM | POA: Insufficient documentation

## 2015-01-05 DIAGNOSIS — Z8639 Personal history of other endocrine, nutritional and metabolic disease: Secondary | ICD-10-CM | POA: Insufficient documentation

## 2015-01-05 HISTORY — DX: Hypoglycemia, unspecified: E16.2

## 2015-01-05 MED ORDER — MECLIZINE HCL 25 MG PO TABS: 25.0000 mg | ORAL_TABLET | Freq: Three times a day (TID) | ORAL | Status: DC | PRN

## 2015-01-05 NOTE — ED Provider Notes (Signed)
CSN: [id number]     Arrival date & time 01/05/15  9518 History   First MD Initiated Contact with Patient 01/05/15 [phone]     Chief Complaint  Patient presents with  . Dizziness     (Consider location/radiation/quality/duration/timing/severity/associated sxs/prior Treatment) HPI Patient has had dizziness for about the past week. She first noticed it when she laid down in bed and rolled over. It felt like everything kind of rushed to one side of her head if she rolled and then when she moved to the other side like it rushed back. She reports it felt like she was on a roller coaster. Since then she has had intermittent episodes of spinning and movement type of dizziness particularly based on head motions from one side to the other. There has not been any associated vomiting, no visual change, no neurologic dysfunction. She has not had fever chills or sinus drainage. Patient did have a more recent respiratory illness and was treated for a pneumonia at the end of August. She reports those symptoms have improved significantly. She states other than this rolling type of dizziness, she feels well. Past Medical History  Diagnosis Date  . Hernia   . Pneumonia     2013,2014  . Hypoglycemia    Past Surgical History  Procedure Laterality Date  . Cesarean section    . Hernia repair    . Cesarean section     No family history on file. Social History  Substance Use Topics  . Smoking status: Current Every Day Smoker -- 0.50 packs/day  . Smokeless tobacco: None  . Alcohol Use: No   OB History    No data available     Review of Systems 10 Systems reviewed and are negative for acute change except as noted in the HPI.    Allergies  Review of patient's allergies indicates no known allergies.  Home Medications   Prior to Admission medications   Medication Sig Start Date End Date Taking? Authorizing Provider  azithromycin (ZITHROMAX) 250 MG tablet Take 1 tablet (250 mg total) by mouth daily.  Take first 2 tablets together, then 1 every day until finished. 12/01/14   Debby Freiberg, MD  chlorpheniramine-HYDROcodone [facility] PENNKINETIC ER) 10-8 MG/5ML SUER Take 5 mLs by mouth every 12 (twelve) hours as needed for cough. 12/01/14   Debby Freiberg, MD  meclizine (ANTIVERT) 25 MG tablet Take 1 tablet (25 mg total) by mouth 3 (three) times daily as needed for dizziness. 01/05/15   Charlesetta Shanks, MD   BP 124/79 mmHg  Pulse 80  Temp(Src) 98.7 F (37.1 C) (Oral)  Resp 20  Ht 5' 7.5" (1.715 m)  Wt 260 lb (117.935 kg)  BMI 40.10 kg/m2  SpO2 100% Physical Exam  Constitutional: She is oriented to person, place, and time. She appears well-developed and well-nourished.  HENT:  Head: Normocephalic and atraumatic.  Bilateral TMs normal without erythema or bulging. No sinus percussion tenderness. Posterior or first is widely patent. No erythema or exudate. Dentition is in good condition. No facial swelling. Normal range of motion of the neck.  Eyes: EOM are normal. Pupils are equal, round, and reactive to light.  Neck: Neck supple.  Cardiovascular: Normal rate, regular rhythm, normal heart sounds and intact distal pulses.   Pulmonary/Chest: Effort normal and breath sounds normal.  Abdominal: Soft. Bowel sounds are normal. She exhibits no distension. There is no tenderness.  Musculoskeletal: Normal range of motion. She exhibits no edema.  Neurological: She is alert and oriented to person,  place, and time. She has normal strength. No cranial nerve deficit. She exhibits normal muscle tone. Coordination normal. GCS eye subscore is 4. GCS verbal subscore is 5. GCS motor subscore is 6.  Dizziness reproduced with far lateral eye motions. Extraocular motions are intact. All movements are coordinated and symmetric and purposeful without difficulty.  Skin: Skin is warm, dry and intact.  Psychiatric: She has a normal mood and affect.    ED Course  Procedures (including critical care time) Labs  Review Labs Reviewed - No data to display  Imaging Review No results found. I have personally reviewed and evaluated these images and lab results as part of my medical decision-making.   EKG Interpretation None      MDM   Final diagnoses:  Vertigo   History and findings are consistent with benign positional vertigo. Patient had recent respiratory illness that is now resolved. She is otherwise well without headache or other neurologic dysfunction. She does not have sinus percussion tenderness, fever or drainage to suggest bacterial sinusitis. Patient be treated with Antivert for vertigo and advised for follow-up.    Charlesetta Shanks, MD 01/05/15 [phone]

## 2015-01-05 NOTE — Discharge Instructions (Signed)
Benign Positional Vertigo Vertigo means you feel like you or your surroundings are moving when they are not. Benign positional vertigo is the most common form of vertigo. Benign means that the cause of your condition is not serious. Benign positional vertigo is more common in older adults. CAUSES  Benign positional vertigo is the result of an upset in the labyrinth system. This is an area in the middle ear that helps control your balance. This may be caused by a viral infection, head injury, or repetitive motion. However, often no specific cause is found. SYMPTOMS  Symptoms of benign positional vertigo occur when you move your head or eyes in different directions. Some of the symptoms may include:  Loss of balance and falls.  Vomiting.  Blurred vision.  Dizziness.  Nausea.  Involuntary eye movements (nystagmus). DIAGNOSIS  Benign positional vertigo is usually diagnosed by physical exam. If the specific cause of your benign positional vertigo is unknown, your caregiver may perform imaging tests, such as magnetic resonance imaging (MRI) or computed tomography (CT). TREATMENT  Your caregiver may recommend movements or procedures to correct the benign positional vertigo. Medicines such as meclizine, benzodiazepines, and medicines for nausea may be used to treat your symptoms. In rare cases, if your symptoms are caused by certain conditions that affect the inner ear, you may need surgery. HOME CARE INSTRUCTIONS   Follow your caregiver's instructions.  Move slowly. Do not make sudden body or head movements.  Avoid driving.  Avoid operating heavy machinery.  Avoid performing any tasks that would be dangerous to you or others during a vertigo episode.  Drink enough fluids to keep your urine clear or pale yellow. SEEK IMMEDIATE MEDICAL CARE IF:   You develop problems with walking, weakness, numbness, or using your arms, hands, or legs.  You have difficulty speaking.  You develop  severe headaches.  Your nausea or vomiting continues or gets worse.  You develop visual changes.  Your family or friends notice any behavioral changes.  Your condition gets worse.  You have a fever.  You develop a stiff neck or sensitivity to light. MAKE SURE YOU:   Understand these instructions.  Will watch your condition.  Will get help right away if you are not doing well or get worse. Document Released: 01/02/2006 Document Revised: 06/19/2011 Document Reviewed: 12/15/2010 Exit[employer] Patient Information 2015 [employer]. This information is not intended to replace advice given to you by your health care provider. Make sure you discuss any questions you have with your health care provider.    

## 2015-01-05 NOTE — ED Notes (Signed)
Patient states she has had intermittent dizziness for the last seven days.  States the dizziness is worse with head movements and standing. Denies nausea, vomiting or diarrhea.

## 2015-01-15 ENCOUNTER — Telehealth: Payer: Self-pay | Admitting: Physician Assistant

## 2015-01-15 ENCOUNTER — Ambulatory Visit: Payer: BLUE CROSS/BLUE SHIELD | Admitting: Physician Assistant

## 2015-01-19 ENCOUNTER — Encounter (HOSPITAL_BASED_OUTPATIENT_CLINIC_OR_DEPARTMENT_OTHER): Payer: Self-pay | Admitting: *Deleted

## 2015-01-19 ENCOUNTER — Emergency Department (HOSPITAL_BASED_OUTPATIENT_CLINIC_OR_DEPARTMENT_OTHER)
Admission: EM | Admit: 2015-01-19 | Discharge: 2015-01-19 | Disposition: A | Discharge status: Home / Self Care | Payer: BLUE CROSS/BLUE SHIELD | Attending: Emergency Medicine | Admitting: Emergency Medicine

## 2015-01-19 ENCOUNTER — Emergency Department (HOSPITAL_BASED_OUTPATIENT_CLINIC_OR_DEPARTMENT_OTHER): Payer: BLUE CROSS/BLUE SHIELD

## 2015-01-19 DIAGNOSIS — B9789 Other viral agents as the cause of diseases classified elsewhere: Secondary | ICD-10-CM

## 2015-01-19 DIAGNOSIS — J069 Acute upper respiratory infection, unspecified: Secondary | ICD-10-CM | POA: Diagnosis not present

## 2015-01-19 DIAGNOSIS — R05 Cough: Secondary | ICD-10-CM | POA: Diagnosis present

## 2015-01-19 DIAGNOSIS — Z792 Long term (current) use of antibiotics: Secondary | ICD-10-CM | POA: Insufficient documentation

## 2015-01-19 DIAGNOSIS — J40 Bronchitis, not specified as acute or chronic: Secondary | ICD-10-CM | POA: Diagnosis not present

## 2015-01-19 DIAGNOSIS — Z720 Tobacco use: Secondary | ICD-10-CM | POA: Insufficient documentation

## 2015-01-19 DIAGNOSIS — Z8701 Personal history of pneumonia (recurrent): Secondary | ICD-10-CM | POA: Insufficient documentation

## 2015-01-19 DIAGNOSIS — Z8719 Personal history of other diseases of the digestive system: Secondary | ICD-10-CM | POA: Diagnosis not present

## 2015-01-19 DIAGNOSIS — Z8639 Personal history of other endocrine, nutritional and metabolic disease: Secondary | ICD-10-CM | POA: Diagnosis not present

## 2015-01-19 MED ORDER — AEROCHAMBER PLUS FLO-VU MEDIUM MISC
1.0000 | Freq: Once | Status: DC
  Filled 2015-01-19: qty 1

## 2015-01-19 MED ORDER — IPRATROPIUM-ALBUTEROL 0.5-2.5 (3) MG/3ML IN SOLN
3.0000 mL | Freq: Once | RESPIRATORY_TRACT | Status: AC
  Administered 2015-01-19: 3 mL via RESPIRATORY_TRACT
  Filled 2015-01-19: qty 3

## 2015-01-19 MED ORDER — PROMETHAZINE-DM 6.25-15 MG/5ML PO SYRP: 5.0000 mL | ORAL_SOLUTION | Freq: Four times a day (QID) | ORAL | Status: DC | PRN

## 2015-01-19 MED ORDER — PREDNISONE 50 MG PO TABS: 50.0000 mg | ORAL_TABLET | Freq: Every day | ORAL | Status: DC

## 2015-01-19 MED ORDER — ALBUTEROL SULFATE HFA 108 (90 BASE) MCG/ACT IN AERS
2.0000 | INHALATION_SPRAY | RESPIRATORY_TRACT | Status: DC | PRN
  Administered 2015-01-19: 2 via RESPIRATORY_TRACT
  Filled 2015-01-19: qty 6.7

## 2015-01-19 MED ORDER — GUAIFENESIN ER 1200 MG PO TB12: 1.0000 | ORAL_TABLET | Freq: Two times a day (BID) | ORAL | Status: DC

## 2015-01-19 NOTE — ED Notes (Signed)
Cough and stuffy nose since yesterday.

## 2015-01-19 NOTE — Discharge Instructions (Signed)
Return here as needed.  Follow-up with her primary care Dr. increase her fluid intake and rest as much as possible

## 2015-01-19 NOTE — ED Provider Notes (Signed)
CSN: [id number]     Arrival date & time 01/19/15  1831 History   First MD Initiated Contact with Patient 01/19/15 1928     Chief Complaint  Patient presents with  . Cough     (Consider location/radiation/quality/duration/timing/severity/associated sxs/prior Treatment) HPI   Jasmin Stewart is a 49 yo female with history of mild asthma presenting with cough. She states that her symptoms started on Saturday with a sore throat and post nasal drip. The sore throat resolved after a few days and since Monday she is complaining of cough, tight chest, feeling feverish, and night sweats. She denies bloody sputum. She does not have an albuterol inhaler at home.  In August 2016 the patient had "walking pneumonia" and states she feels these symptoms are similar to then.   Past Medical History  Diagnosis Date  . Hernia   . Pneumonia     2013,2014  . Hypoglycemia    Past Surgical History  Procedure Laterality Date  . Cesarean section    . Hernia repair    . Cesarean section     No family history on file. Social History  Substance Use Topics  . Smoking status: Current Every Day Smoker -- 0.50 packs/day  . Smokeless tobacco: None  . Alcohol Use: No   OB History    No data available     Review of Systems  All other systems negative except as documented in the HPI. All pertinent positives and negatives as reviewed in the HPI.=  Allergies  Review of patient's allergies indicates no known allergies.  Home Medications   Prior to Admission medications   Medication Sig Start Date End Date Taking? Authorizing Provider  Esomeprazole Magnesium (NEXIUM PO) Take by mouth.   Yes Historical Provider, MD  azithromycin (ZITHROMAX) 250 MG tablet Take 1 tablet (250 mg total) by mouth daily. Take first 2 tablets together, then 1 every day until finished. 12/01/14   Debby Freiberg, MD  chlorpheniramine-HYDROcodone [facility] PENNKINETIC ER) 10-8 MG/5ML SUER Take 5 mLs by mouth every 12 (twelve) hours as  needed for cough. 12/01/14   Debby Freiberg, MD  meclizine (ANTIVERT) 25 MG tablet Take 1 tablet (25 mg total) by mouth 3 (three) times daily as needed for dizziness. 01/05/15   Charlesetta Shanks, MD   BP 125/79 mmHg  Pulse 94  Temp(Src) 98.6 F (37 C) (Oral)  Resp 18  Ht 5' 7.5" (1.715 m)  Wt 260 lb (117.935 kg)  BMI 40.10 kg/m2  SpO2 100%  LMP 12/20/2014 Physical Exam  Constitutional: She is oriented to person, place, and time. She appears well-developed and well-nourished. No distress.  HENT:  Head: Normocephalic and atraumatic.  Mouth/Throat: Oropharynx is clear and moist.  Eyes: Pupils are equal, round, and reactive to light.  Neck: Normal range of motion. Neck supple.  Cardiovascular: Normal rate, regular rhythm and normal heart sounds.  Exam reveals no gallop and no friction rub.   No murmur heard. Pulmonary/Chest: Effort normal and breath sounds normal. No respiratory distress. She has no wheezes.  Abdominal: Soft. Bowel sounds are normal. She exhibits no distension. There is no tenderness.  Neurological: She is alert and oriented to person, place, and time. She exhibits normal muscle tone. Coordination normal.  Skin: Skin is warm and dry. No rash noted. No erythema.  Psychiatric: She has a normal mood and affect. Her behavior is normal.  Nursing note and vitals reviewed.   ED Course  Procedures (including critical care time) Labs Review Labs Reviewed - No  data to display  Imaging Review Dg Chest 2 View  01/19/2015   CLINICAL DATA:  Cough and congestion shortness of breath for 3 days  EXAM: CHEST - 2 VIEW  COMPARISON:  12/01/2014  FINDINGS: Cardiac shadow is stable. The lungs are well aerated bilaterally. Likely scarring scarring is noted in the anterior aspect of the lingula. No bony abnormality is noted.  IMPRESSION: No acute abnormality noted.  No change from the prior exam.   Electronically Signed   By: Inez Catalina M.D.   On: 01/19/2015 19:59   I have personally  reviewed and evaluated these images and lab results as part of my medical decision-making.   The patient will be treated for bronchitis.  Told to return here as needed.  Advised to increase her fluid intake, rest as much possible  Dalia Heading, PA-C 01/19/15 2047  Veryl Speak, MD 01/19/15 2141

## 2015-01-25 NOTE — Telephone Encounter (Signed)
Pt was no show 01/25/15 8:00am for new pt appt, pt has not rescheduled, charge or no charge?

## 2015-01-25 NOTE — Telephone Encounter (Signed)
No charge. Do not reschedule with me.

## 2015-02-27 ENCOUNTER — Emergency Department (HOSPITAL_BASED_OUTPATIENT_CLINIC_OR_DEPARTMENT_OTHER)
Admission: EM | Admit: 2015-02-27 | Discharge: 2015-02-27 | Disposition: A | Discharge status: Home / Self Care | Payer: BLUE CROSS/BLUE SHIELD | Attending: Emergency Medicine | Admitting: Emergency Medicine

## 2015-02-27 ENCOUNTER — Encounter (HOSPITAL_BASED_OUTPATIENT_CLINIC_OR_DEPARTMENT_OTHER): Payer: Self-pay | Admitting: *Deleted

## 2015-02-27 DIAGNOSIS — L02411 Cutaneous abscess of right axilla: Secondary | ICD-10-CM | POA: Insufficient documentation

## 2015-02-27 DIAGNOSIS — Z7952 Long term (current) use of systemic steroids: Secondary | ICD-10-CM | POA: Insufficient documentation

## 2015-02-27 DIAGNOSIS — L089 Local infection of the skin and subcutaneous tissue, unspecified: Secondary | ICD-10-CM | POA: Diagnosis present

## 2015-02-27 DIAGNOSIS — Z8639 Personal history of other endocrine, nutritional and metabolic disease: Secondary | ICD-10-CM | POA: Insufficient documentation

## 2015-02-27 DIAGNOSIS — Z8719 Personal history of other diseases of the digestive system: Secondary | ICD-10-CM | POA: Diagnosis not present

## 2015-02-27 DIAGNOSIS — Z79899 Other long term (current) drug therapy: Secondary | ICD-10-CM | POA: Insufficient documentation

## 2015-02-27 DIAGNOSIS — F17200 Nicotine dependence, unspecified, uncomplicated: Secondary | ICD-10-CM | POA: Diagnosis not present

## 2015-02-27 DIAGNOSIS — Z8701 Personal history of pneumonia (recurrent): Secondary | ICD-10-CM | POA: Diagnosis not present

## 2015-02-27 MED ORDER — SULFAMETHOXAZOLE-TRIMETHOPRIM 800-160 MG PO TABS: 1.0000 | ORAL_TABLET | Freq: Two times a day (BID) | ORAL | Status: AC

## 2015-02-27 MED ORDER — LIDOCAINE HCL 2 % IJ SOLN
5.0000 mL | Freq: Once | INTRAMUSCULAR | Status: AC
  Administered 2015-02-27: 5 mg
  Filled 2015-02-27: qty 20

## 2015-02-27 MED ORDER — HYDROCODONE-ACETAMINOPHEN 5-325 MG PO TABS: 1.0000 | ORAL_TABLET | Freq: Four times a day (QID) | ORAL | Status: DC | PRN

## 2015-02-27 MED ORDER — CEPHALEXIN 500 MG PO CAPS: 500.0000 mg | ORAL_CAPSULE | Freq: Four times a day (QID) | ORAL | Status: DC

## 2015-02-27 NOTE — ED Notes (Signed)
Pt with abcess under right arm. X 6 days denies fever N/V

## 2015-02-27 NOTE — Discharge Instructions (Signed)
Keflex and Bactrim as prescribed.  Hydrocodone as prescribed as needed for pain.  Apply warm soaks to the area as frequently as possible for the next 3 days, and apply bacitracin and a Band-Aid when not soaking..  Return to the emergency department if symptoms significantly worsen or change.   Abscess An abscess is an infected area that contains a collection of pus and debris.It can occur in almost any part of the body. An abscess is also known as a furuncle or boil. CAUSES  An abscess occurs when tissue gets infected. This can occur from blockage of oil or sweat glands, infection of hair follicles, or a minor injury to the skin. As the body tries to fight the infection, pus collects in the area and creates pressure under the skin. This pressure causes pain. People with weakened immune systems have difficulty fighting infections and get certain abscesses more often.  SYMPTOMS Usually an abscess develops on the skin and becomes a painful mass that is red, warm, and tender. If the abscess forms under the skin, you may feel a moveable soft area under the skin. Some abscesses break open (rupture) on their own, but most will continue to get worse without care. The infection can spread deeper into the body and eventually into the bloodstream, causing you to feel ill.  DIAGNOSIS  Your caregiver will take your medical history and perform a physical exam. A sample of fluid may also be taken from the abscess to determine what is causing your infection. TREATMENT  Your caregiver may prescribe antibiotic medicines to fight the infection. However, taking antibiotics alone usually does not cure an abscess. Your caregiver may need to make a small cut (incision) in the abscess to drain the pus. In some cases, gauze is packed into the abscess to reduce pain and to continue draining the area. HOME CARE INSTRUCTIONS   Only take over-the-counter or prescription medicines for pain, discomfort, or fever as directed  by your caregiver.  If you were prescribed antibiotics, take them as directed. Finish them even if you start to feel better.  If gauze is used, follow your caregiver's directions for changing the gauze.  To avoid spreading the infection:  Keep your draining abscess covered with a bandage.  Wash your hands well.  Do not share personal care items, towels, or whirlpools with others.  Avoid skin contact with others.  Keep your skin and clothes clean around the abscess.  Keep all follow-up appointments as directed by your caregiver. SEEK MEDICAL CARE IF:   You have increased pain, swelling, redness, fluid drainage, or bleeding.  You have muscle aches, chills, or a general ill feeling.  You have a fever. MAKE SURE YOU:   Understand these instructions.  Will watch your condition.  Will get help right away if you are not doing well or get worse.   This information is not intended to replace advice given to you by your health care provider. Make sure you discuss any questions you have with your health care provider.   Document Released: 01/04/2005 Document Revised: 09/26/2011 Document Reviewed: 06/09/2011 Elsevier Interactive Patient Education [employer].

## 2015-02-27 NOTE — ED Provider Notes (Signed)
CSN: [id number]     Arrival date & time 02/27/15  0602 History   First MD Initiated Contact with Patient 02/27/15 0615     Chief Complaint  Patient presents with  . Recurrent Skin Infections     (Consider location/radiation/quality/duration/timing/severity/associated sxs/prior Treatment) HPI Comments: Patient is 49 year old female with history of hypoglycemia, pneumonia. She presents for evaluation of a swollen, red, tender area below her right axilla. She denies any fevers or chills.  Patient is a 49 y.o. female presenting with abscess. The history is provided by the patient.  Abscess Abscess location: Right axilla. Abscess quality: fluctuance, induration, painful, redness and warmth   Abscess quality: not draining   Red streaking: no   Duration:  6 days Progression:  Worsening Pain details:    Severity:  Moderate   Timing:  Constant   Progression:  Worsening Chronicity:  New Context: not diabetes     Past Medical History  Diagnosis Date  . Hernia   . Pneumonia     2013,2014  . Hypoglycemia    Past Surgical History  Procedure Laterality Date  . Cesarean section    . Hernia repair    . Cesarean section     History reviewed. No pertinent family history. Social History  Substance Use Topics  . Smoking status: Current Every Day Smoker -- 0.50 packs/day  . Smokeless tobacco: None  . Alcohol Use: No   OB History    No data available     Review of Systems  All other systems reviewed and are negative.     Allergies  Review of patient's allergies indicates no known allergies.  Home Medications   Prior to Admission medications   Medication Sig Start Date End Date Taking? Authorizing Provider  albuterol (PROVENTIL) (2.5 MG/3ML) 0.083% nebulizer solution Take 2.5 mg by nebulization every 6 (six) hours as needed for wheezing or shortness of breath.   Yes Historical Provider, MD  azithromycin (ZITHROMAX) 250 MG tablet Take 1 tablet (250 mg total) by mouth daily.  Take first 2 tablets together, then 1 every day until finished. 12/01/14   Debby Freiberg, MD  chlorpheniramine-HYDROcodone [facility] PENNKINETIC ER) 10-8 MG/5ML SUER Take 5 mLs by mouth every 12 (twelve) hours as needed for cough. 12/01/14   Debby Freiberg, MD  Esomeprazole Magnesium (NEXIUM PO) Take by mouth.    Historical Provider, MD  Guaifenesin 1200 MG TB12 Take 1 tablet (1,200 mg total) by mouth 2 (two) times daily. 01/19/15   Dalia Heading, PA-C  meclizine (ANTIVERT) 25 MG tablet Take 1 tablet (25 mg total) by mouth 3 (three) times daily as needed for dizziness. 01/05/15   Charlesetta Shanks, MD  predniSONE (DELTASONE) 50 MG tablet Take 1 tablet (50 mg total) by mouth daily with breakfast. 01/19/15   Dalia Heading, PA-C  promethazine-dextromethorphan (PROMETHAZINE-DM) 6.25-15 MG/5ML syrup Take 5 mLs by mouth 4 (four) times daily as needed for cough. 01/19/15   Christopher Lawyer, PA-C   BP 131/94 mmHg  Pulse 86  Temp(Src) 98.5 F (36.9 C) (Oral)  Resp 18  Ht 5\' 7"  (1.702 m)  Wt 235 lb (106.595 kg)  BMI 36.80 kg/m2  LMP 02/13/2015 Physical Exam  Constitutional: She is oriented to person, place, and time. She appears well-developed and well-nourished. No distress.  HENT:  Head: Normocephalic and atraumatic.  Neck: Normal range of motion. Neck supple.  Neurological: She is alert and oriented to person, place, and time.  Skin: Skin is warm and dry. She is not diaphoretic.  There  is a 2.5 cm round, fluctuant, swollen, tender, erythematous area just below the right axilla.  Nursing note and vitals reviewed.   ED Course  Procedures (including critical care time) Labs Review Labs Reviewed - No data to display  Imaging Review No results found. I have personally reviewed and evaluated these images and lab results as part of my medical decision-making.   EKG Interpretation None     INCISION AND DRAINAGE Performed by: Veryl Speak Consent: Verbal consent obtained. Risks  and benefits: risks, benefits and alternatives were discussed Type: abscess   Body area: Right axilla  Anesthesia: local infiltration  Incision was made with a scalpel.  Local anesthetic: lidocaine 1 % without epinephrine  Anesthetic total: 1 ml  Complexity: complex Blunt dissection to break up loculations  Drainage: purulent  Drainage amount: Moderate   Packing material: No packing placed   Patient tolerance: Patient tolerated the procedure well with no immediate complications.     MDM   Final diagnoses:  None    Wound incised and drained of a moderate quantity of purulent material. She will be treated with Keflex and Bactrim, pain medication, warm soaks, and when necessary return if she worsens.    Veryl Speak, MD 02/27/15 [phone]

## 2015-07-16 ENCOUNTER — Emergency Department (HOSPITAL_BASED_OUTPATIENT_CLINIC_OR_DEPARTMENT_OTHER)
Admission: EM | Admit: 2015-07-16 | Discharge: 2015-07-16 | Disposition: A | Discharge status: Home / Self Care | Payer: Managed Care, Other (non HMO) | Attending: Emergency Medicine | Admitting: Emergency Medicine

## 2015-07-16 ENCOUNTER — Encounter (HOSPITAL_BASED_OUTPATIENT_CLINIC_OR_DEPARTMENT_OTHER): Payer: Self-pay | Admitting: Emergency Medicine

## 2015-07-16 ENCOUNTER — Emergency Department (HOSPITAL_BASED_OUTPATIENT_CLINIC_OR_DEPARTMENT_OTHER): Payer: Managed Care, Other (non HMO)

## 2015-07-16 DIAGNOSIS — Z8719 Personal history of other diseases of the digestive system: Secondary | ICD-10-CM | POA: Insufficient documentation

## 2015-07-16 DIAGNOSIS — Z8701 Personal history of pneumonia (recurrent): Secondary | ICD-10-CM | POA: Insufficient documentation

## 2015-07-16 DIAGNOSIS — R0789 Other chest pain: Secondary | ICD-10-CM | POA: Insufficient documentation

## 2015-07-16 DIAGNOSIS — R079 Chest pain, unspecified: Secondary | ICD-10-CM | POA: Diagnosis present

## 2015-07-16 DIAGNOSIS — Z8639 Personal history of other endocrine, nutritional and metabolic disease: Secondary | ICD-10-CM | POA: Insufficient documentation

## 2015-07-16 DIAGNOSIS — F17200 Nicotine dependence, unspecified, uncomplicated: Secondary | ICD-10-CM | POA: Diagnosis not present

## 2015-07-16 HISTORY — DX: Ulcer of esophagus without bleeding: K22.10

## 2015-07-16 HISTORY — DX: Pure hypercholesterolemia, unspecified: E78.00

## 2015-07-16 HISTORY — DX: Hypokalemia: E87.6

## 2015-07-16 LAB — CBC WITH DIFFERENTIAL/PLATELET
BASOS ABS: 0.1 10*3/uL (ref 0.0–0.1)
BASOS PCT: 1 %
EOS ABS: 0.2 10*3/uL (ref 0.0–0.7)
Eosinophils Relative: 3 %
HCT: 38 % (ref 36.0–46.0)
HEMOGLOBIN: 12.6 g/dL (ref 12.0–15.0)
LYMPHS PCT: 34 %
Lymphs Abs: 3.2 10*3/uL (ref 0.7–4.0)
MCH: 29.3 pg (ref 26.0–34.0)
MCHC: 33.2 g/dL (ref 30.0–36.0)
MCV: 88.4 fL (ref 78.0–100.0)
MONO ABS: 0.6 10*3/uL (ref 0.1–1.0)
MONOS PCT: 6 %
NEUTROS ABS: 5.3 10*3/uL (ref 1.7–7.7)
Neutrophils Relative %: 56 %
Platelets: 391 10*3/uL (ref 150–400)
RBC: 4.3 MIL/uL (ref 3.87–5.11)
RDW: 14.7 % (ref 11.5–15.5)
WBC: 9.4 10*3/uL (ref 4.0–10.5)

## 2015-07-16 LAB — BASIC METABOLIC PANEL
ANION GAP: 9 (ref 5–15)
BUN: 12 mg/dL (ref 6–20)
CHLORIDE: 104 mmol/L (ref 101–111)
CO2: 25 mmol/L (ref 22–32)
CREATININE: 0.51 mg/dL (ref 0.44–1.00)
Calcium: 8.8 mg/dL — ABNORMAL LOW (ref 8.9–10.3)
GFR calc non Af Amer: >60 mL/min (ref >60)
Glucose, Bld: 91 mg/dL (ref 65–99)
POTASSIUM: 3.7 mmol/L (ref 3.5–5.1)
SODIUM: 138 mmol/L (ref 135–145)

## 2015-07-16 LAB — TROPONIN I

## 2015-07-16 MED ORDER — IBUPROFEN 200 MG PO TABS
600.0000 mg | ORAL_TABLET | Freq: Once | ORAL | Status: AC
  Administered 2015-07-16: 600 mg via ORAL
  Filled 2015-07-16: qty 1

## 2015-07-16 MED ORDER — IBUPROFEN 600 MG PO TABS: 600.0000 mg | ORAL_TABLET | Freq: Three times a day (TID) | ORAL | Status: DC | PRN

## 2015-07-16 MED FILL — IBUPROFEN 600 MG TABLET: 600 | 5 days supply | Qty: 15 | Fill #0

## 2015-07-16 NOTE — ED Provider Notes (Addendum)
CSN: [id number]     Arrival date & time 07/16/15  [id number] History   First MD Initiated Contact with Patient 07/16/15 0800     Chief Complaint  Patient presents with  . Chest Pain     HPI Patient until constant left-sided chest discomfort since last night.  She denies shortness of breath.  Denies cough.  No fevers or chills.  No radiation of her pain.  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  She does not remember any injury or trauma to her chest.  No history of DVT or pulmonary embolism.  She does have hypercholesterolemia and is not on medications for this.  She does smoke cigarettes.  No family history of early cardiac disease.  Nothing worsens or improves her symptoms.   Past Medical History  Diagnosis Date  . Hernia   . Pneumonia     2013,2014  . Hypoglycemia   . Hypercholesteremia   . Hypokalemia   . Ulcer of esophagus    Past Surgical History  Procedure Laterality Date  . Cesarean section    . Hernia repair    . Cesarean section     No family history on file. Social History  Substance Use Topics  . Smoking status: Current Every Day Smoker -- 0.50 packs/day  . Smokeless tobacco: None  . Alcohol Use: No   OB History    No data available     Review of Systems  All other systems reviewed and are negative.     Allergies  Review of patient's allergies indicates no known allergies.  Home Medications   Prior to Admission medications   Medication Sig Start Date End Date Taking? Authorizing Provider  Esomeprazole Magnesium (NEXIUM PO) Take by mouth.    Historical Provider, MD   BP 141/78 mmHg  Pulse 66  Temp(Src) 98.6 F (37 C) (Oral)  Resp 20  Ht 5\' 7"  (1.702 m)  Wt 250 lb 8 oz (113.626 kg)  BMI 39.22 kg/m2  SpO2 99%  LMP 07/05/2015 Physical Exam  Constitutional: She is oriented to person, place, and time. She appears well-developed and well-nourished. No distress.  HENT:  Head: Normocephalic  and atraumatic.  Eyes: EOM are normal.  Neck: Normal range of motion.  Cardiovascular: Normal rate, regular rhythm and normal heart sounds.   Pulmonary/Chest: Effort normal and breath sounds normal.  Mild tenderness left anterior chest wall just left of the sternal border  Abdominal: Soft. She exhibits no distension. There is no tenderness.  Musculoskeletal: Normal range of motion.  Neurological: She is alert and oriented to person, place, and time.  Skin: Skin is warm and dry.  Psychiatric: She has a normal mood and affect. Judgment normal.  Nursing note and vitals reviewed.   ED Course  Procedures (including critical care time) Labs Review Labs Reviewed  BASIC METABOLIC PANEL - Abnormal; Notable for the following:    Calcium 8.8 (*)    All other components within normal limits  CBC WITH DIFFERENTIAL/PLATELET  TROPONIN I    Imaging Review Dg Chest 2 View  07/16/2015  CLINICAL DATA:  Acute chest pain. EXAM: CHEST  2 VIEW COMPARISON:  January 19, 2015. FINDINGS: The heart size and mediastinal contours are within normal limits. Both lungs are clear. No pneumothorax or pleural effusion is noted. The visualized skeletal structures are unremarkable. IMPRESSION: No active cardiopulmonary disease. Electronically Signed   By: Marijo Conception, M.D.  On: 07/16/2015 08:46   I have personally reviewed and evaluated these images and lab results as part of my medical decision-making.   EKG Interpretation   Date/Time:  Friday July 16 2015 08:09:20 EDT Ventricular Rate:  58 PR Interval:  120 QRS Duration: 100 QT Interval:  435 QTC Calculation: 427 R Axis:   57 Text Interpretation:  Sinus rhythm No significant change was found no  pvc's noted as compared to 2012 Confirmed by Aljean Horiuchi  MD, Kavaughn Faucett ([id number]) on  07/16/2015 8:35:31 AM      MDM   Final diagnoses:  None    Likely musculoskeletal chest wall pain.  EKG without ischemic changes.  Chest x-ray normal.  Labs including troponin  normal.  Pain is been constant.  Recommended anti-inflammatories.  Patient does report a stress test 2 years ago which was normal and she had been seen at that time by cardiologist for multiple PVCs and/or Holter for 2 days that only demonstrated PVCs.    Jola Schmidt, MD 07/16/15 [place], MD 07/16/15 [phone]

## 2015-07-16 NOTE — Discharge Instructions (Signed)

## 2015-07-16 NOTE — ED Notes (Signed)
Left sided chest pain since last night.   Pt states she is stressed lately.  Pt denies other symptoms or recent travel.

## 2015-07-28 ENCOUNTER — Encounter: Payer: Self-pay | Admitting: Gynecology

## 2015-07-28 ENCOUNTER — Ambulatory Visit (INDEPENDENT_AMBULATORY_CARE_PROVIDER_SITE_OTHER): Payer: Managed Care, Other (non HMO) | Admitting: Gynecology

## 2015-07-28 VITALS — BP 124/76 | Ht 66.5 in | Wt 247.0 lb

## 2015-07-28 DIAGNOSIS — K432 Incisional hernia without obstruction or gangrene: Secondary | ICD-10-CM | POA: Diagnosis not present

## 2015-07-28 DIAGNOSIS — Z13220 Encounter for screening for lipoid disorders: Secondary | ICD-10-CM

## 2015-07-28 DIAGNOSIS — Z01419 Encounter for gynecological examination (general) (routine) without abnormal findings: Secondary | ICD-10-CM | POA: Diagnosis not present

## 2015-07-28 LAB — LIPID PANEL
Cholesterol: 261 mg/dL — ABNORMAL HIGH (ref 125–200)
HDL: 32 mg/dL — ABNORMAL LOW (ref >=46)
LDL Cholesterol: 166 mg/dL — ABNORMAL HIGH (ref <130)
Total CHOL/HDL Ratio: 8.2 Ratio — ABNORMAL HIGH (ref <=5.0)
Triglycerides: 316 mg/dL — ABNORMAL HIGH (ref <150)
VLDL: 63 mg/dL — ABNORMAL HIGH (ref <30)

## 2015-07-28 NOTE — Patient Instructions (Signed)
Call to Schedule your mammogram  Facilities in [place]: 1)  [facility]. [address] [place] Phone: [phone] 2)  Dr. Isaiah Blakes at [facility] [address] Phone: [phone]     Mammogram A mammogram is an X-ray test to find changes in a woman's breast. You should get a mammogram if:  You are 48 years of age or older  You have risk factors.   Your doctor recommends that you have one.  BEFORE THE TEST 1. Do not schedule the test the week before your period, especially if your breasts are sore during this time.  On the day of your mammogram:  Wash your breasts and armpits well. After washing, do not put on any deodorant or talcum powder on until after your test.   Eat and drink as you usually do.   Take your medicines as usual.   If you are diabetic and take insulin, make sure you:   Eat before coming for your test.   Take your insulin as usual.   If you cannot keep your appointment, call before the appointment to cancel. Schedule another appointment.  TEST  You will need to undress from the waist up. You will put on a hospital gown.   Your breast will be put on the mammogram machine, and it will press firmly on your breast with a piece of plastic called a compression paddle. This will make your breast flatter so that the machine can X-ray all parts of your breast.   Both breasts will be X-rayed. Each breast will be X-rayed from above and from the side. An X-ray might need to be taken again if the picture is not good enough.   The mammogram will last about 15 to 30 minutes.  AFTER THE TEST Finding out the results of your test Ask when your test results will be ready. Make sure you get your test results.  Document Released: 06/23/2008 Document Revised: 03/16/2011 Document Reviewed: 06/23/2008 [facility] Patient Information 2012 [place].  Consider Stop Smoking.  Help is available at  [facility] smoking cessation program @ www.[place].com or 9347097575. OR 1-800-QUIT-NOW 641-206-3853) for free smoking cessation counseling.  Smokefree.gov (Inrails.tn) provides free, accurate, evidence-based information and professional assistance to help support the immediate and long-term needs of people trying to quit smoking.    Smoking Hazards Smoking cigarettes is extremely bad for your health. Tobacco smoke has over 200 known poisons in it. There are over 60 chemicals in tobacco smoke that cause cancer. Some of the chemicals found in cigarette smoke include:  Cyanide.  Benzene.  Formaldehyde.  Methanol (wood alcohol).  Acetylene (fuel used in welding torches).  Ammonia.  Cigarette smoke also contains the poisonous gases nitrogen oxide and carbon monoxide.  Cigarette smokers have an increased risk of many serious medical problems, including: Lung cancer.  Lung disease (such as pneumonia, bronchitis, and emphysema).  Heart attack and chest pain due to the heart not getting enough oxygen (angina).  Heart disease and peripheral blood vessel disease.  Hypertension.  Stroke.  Oral cancer (cancer of the lip, mouth, or voice box).  Bladder cancer.  Pancreatic cancer.  Cervical cancer.  Pregnancy complications, including premature birth.  Low birthweight babies.  Early menopause.  Lower estrogen level for women.  Infertility.  Facial wrinkles.  Blindness.  Increased risk of broken bones (fractures).  Senile dementia.  Stillbirths and smaller newborn babies, birth defects, and genetic damage to  sperm.  Stomach ulcers and internal bleeding.  Children of smokers have an increased risk of the following, because of secondhand smoke exposure:  Sudden infant death syndrome (SIDS).  Respiratory infections.  Lung cancer.  Heart disease.  Ear infections.  Smoking causes approximately: 90% of all lung cancer deaths in men.  80% of all lung cancer deaths  in women.  90% of deaths from chronic obstructive lung disease.  Compared with nonsmokers, smoking increases the risk of: Coronary heart disease by 2 to 4 times.  Stroke by 2 to 4 times.  Men developing lung cancer by 23 times.  Women developing lung cancer by 13 times.  Dying from chronic obstructive lung diseases by 12 times.  Someone who smokes 2 packs a day loses about 8 years of his or her life. Even smoking lightly shortens your life expectancy by several years. You can greatly reduce the risk of medical problems for you and your family by stopping now. Smoking is the most preventable cause of death and disease in our society. Within days of quitting smoking, your circulation returns to normal, you decrease the risk of having a heart attack, and your lung capacity improves. There may be some increased phlegm in the first few days after quitting, and it may take months for your lungs to clear up completely. Quitting for 10 years cuts your lung cancer risk to almost that of a nonsmoker. WHY IS SMOKING ADDICTIVE? Nicotine is the chemical agent in tobacco that is capable of causing addiction or dependence.  When you smoke and inhale, nicotine is absorbed rapidly into the bloodstream through your lungs. Nicotine absorbed through the lungs is capable of creating a powerful addiction. Both inhaled and non-inhaled nicotine may be addictive.  Addiction studies of cigarettes and spit tobacco show that addiction to nicotine occurs mainly during the teen years, when young people begin using tobacco products.  WHAT ARE THE BENEFITS OF QUITTING?  There are many health benefits to quitting smoking.  Likelihood of developing cancer and heart disease decreases. Health improvements are seen almost immediately.  Blood pressure, pulse rate, and breathing patterns start returning to normal soon after quitting.  People who quit may see an improvement in their overall quality of life.  Some people choose to quit  all at once. Other options include nicotine replacement products, such as patches, gum, and nasal sprays. Do not use these products without first checking with your caregiver. QUITTING SMOKING It is not easy to quit smoking. Nicotine is addicting, and longtime habits are hard to change. To start, you can write down all your reasons for quitting, tell your family and friends you want to quit, and ask for their help. Throw your cigarettes away, chew gum or cinnamon sticks, keep your hands busy, and drink extra water or juice. Go for walks and practice deep breathing to relax. Think of all the money you are saving: around $1,000 a year, for the average pack-a-day smoker. Nicotine patches and gum have been shown to improve success at efforts to stop smoking. Zyban (bupropion) is an anti-depressant drug that can be prescribed to reduce nicotine withdrawal symptoms and to suppress the urge to smoke. Smoking is an addiction with both physical and psychological effects. Joining a stop-smoking support group can help you cope with the emotional issues. For more information and advice on programs to stop smoking, call your doctor, your local hospital, or these organizations: American Lung Association - 1-800-LUNGUSA   Smoking Cessation  This document explains the  best ways for you to quit smoking and new treatments to help. It lists new medicines that can double or triple your chances of quitting and quitting for good. It also considers ways to avoid relapses and concerns you may have about quitting, including weight gain. NICOTINE: A POWERFUL ADDICTION If you have tried to quit smoking, you know how hard it can be. It is hard because nicotine is a very addictive drug. For some people, it can be as addictive as heroin or cocaine. Usually, people make 2 or 3 tries, or more, before finally being able to quit. Each time you try to quit, you can learn about what helps and what hurts. Quitting takes hard work and a lot of  effort, but you can quit smoking. QUITTING SMOKING IS ONE OF THE MOST IMPORTANT THINGS YOU WILL EVER DO.  You will live longer, feel better, and live better.   The impact on your body of quitting smoking is felt almost immediately:   Within 20 minutes, blood pressure decreases. Pulse returns to its normal level.   After 8 hours, carbon monoxide levels in the blood return to normal. Oxygen level increases.   After 24 hours, chance of heart attack starts to decrease. Breath, hair, and body stop smelling like smoke.   After 48 hours, damaged nerve endings begin to recover. Sense of taste and smell improve.   After 72 hours, the body is virtually free of nicotine. Bronchial tubes relax and breathing becomes easier.   After 2 to 12 weeks, lungs can hold more air. Exercise becomes easier and circulation improves.   Quitting will reduce your risk of having a heart attack, stroke, cancer, or lung disease:   After 1 year, the risk of coronary heart disease is cut in half.   After 5 years, the risk of stroke falls to the same as a nonsmoker.   After 10 years, the risk of lung cancer is cut in half and the risk of other cancers decreases significantly.   After 15 years, the risk of coronary heart disease drops, usually to the level of a nonsmoker.   If you are pregnant, quitting smoking will improve your chances of having a healthy baby.   The people you live with, especially your children, will be healthier.   You will have extra money to spend on things other than cigarettes.  FIVE KEYS TO QUITTING Studies have shown that these 5 steps will help you quit smoking and quit for good. You have the best chances of quitting if you use them together: 2. Get ready.  3. Get support and encouragement.  4. Learn new skills and behaviors.  5. Get medicine to reduce your nicotine addiction and use it correctly.  6. Be prepared for relapse or difficult situations. Be determined to continue trying to  quit, even if you do not succeed at first.  1. GET READY  Set a quit date.   Change your environment.   Get rid of ALL cigarettes, ashtrays, matches, and lighters in your home, car, and place of work.   Do not let people smoke in your home.   Review your past attempts to quit. Think about what worked and what did not.   Once you quit, do not smoke. NOT EVEN A PUFF!  2. GET SUPPORT AND ENCOURAGEMENT Studies have shown that you have a better chance of being successful if you have help. You can get support in many ways.  Tell your family, friends, and coworkers  that you are going to quit and need their support. Ask them not to smoke around you.   Talk to your caregivers (doctor, dentist, nurse, pharmacist, psychologist, and/or smoking counselor).   Get individual, group, or telephone counseling and support. The more counseling you have, the better your chances are of quitting. Programs are available at [employer] and health centers. Call your local health department for information about programs in your area.   Spiritual beliefs and practices may help some smokers quit.   Quit meters are Insurance underwriter that keep track of quit statistics, such as amount of "quit-time," cigarettes not smoked, and money saved.   Many smokers find one or more of the many self-help books available useful in helping them quit and stay off tobacco.  3. LEARN NEW SKILLS AND BEHAVIORS  Try to distract yourself from urges to smoke. Talk to someone, go for a walk, or occupy your time with a task.   When you first try to quit, change your routine. Take a different route to work. Drink tea instead of coffee. Eat breakfast in a different place.   Do something to reduce your stress. Take a hot bath, exercise, or read a book.   Plan something enjoyable to do every day. Reward yourself for not smoking.   Explore interactive web-based programs that specialize in helping you  quit.  4. GET MEDICINE AND USE IT CORRECTLY Medicines can help you stop smoking and decrease the urge to smoke. Combining medicine with the above behavioral methods and support can quadruple your chances of successfully quitting smoking. The U.S. Food and Drug Administration (FDA) has approved 7 medicines to help you quit smoking. These medicines fall into 3 categories.  Nicotine replacement therapy (delivers nicotine to your body without the negative effects and risks of smoking):   Nicotine gum: Available over-the-counter.   Nicotine lozenges: Available over-the-counter.   Nicotine inhaler: Available by prescription.   Nicotine nasal spray: Available by prescription.   Nicotine skin patches (transdermal): Available by prescription and over-the-counter.   Antidepressant medicine (helps people abstain from smoking, but how this works is unknown):   Bupropion sustained-release (SR) tablets: Available by prescription.   Nicotinic receptor partial agonist (simulates the effect of nicotine in your brain):   Varenicline tartrate tablets: Available by prescription.   Ask your caregiver for advice about which medicines to use and how to use them. Carefully read the information on the package.   Everyone who is trying to quit may benefit from using a medicine. If you are pregnant or trying to become pregnant, nursing an infant, you are under age 2, or you smoke fewer than 10 cigarettes per day, talk to your caregiver before taking any nicotine replacement medicines.   You should stop using a nicotine replacement product and call your caregiver if you experience nausea, dizziness, weakness, vomiting, fast or irregular heartbeat, mouth problems with the lozenge or gum, or redness or swelling of the skin around the patch that does not go away.   Do not use any other product containing nicotine while using a nicotine replacement product.   Talk to your caregiver before using these products if  you have diabetes, heart disease, asthma, stomach ulcers, you had a recent heart attack, you have high blood pressure that is not controlled with medicine, a history of irregular heartbeat, or you have been prescribed medicine to help you quit smoking.  5. BE PREPARED FOR RELAPSE OR DIFFICULT SITUATIONS  Most relapses  occur within the first 3 months after quitting. Do not be discouraged if you start smoking again. Remember, most people try several times before they finally quit.   You may have symptoms of withdrawal because your body is used to nicotine. You may crave cigarettes, be irritable, feel very hungry, cough often, get headaches, or have difficulty concentrating.   The withdrawal symptoms are only temporary. They are strongest when you first quit, but they will go away within 10 to 14 days.  Here are some difficult situations to watch for:  Alcohol. Avoid drinking alcohol. Drinking lowers your chances of successfully quitting.   Caffeine. Try to reduce the amount of caffeine you consume. It also lowers your chances of successfully quitting.   Other smokers. Being around smoking can make you want to smoke. Avoid smokers.   Weight gain. Many smokers will gain weight when they quit, usually less than 10 pounds. Eat a healthy diet and stay active. Do not let weight gain distract you from your main goal, quitting smoking. Some medicines that help you quit smoking may also help delay weight gain. You can always lose the weight gained after you quit.   Bad mood or depression. There are a lot of ways to improve your mood other than smoking.  If you are having problems with any of these situations, talk to your caregiver. SPECIAL SITUATIONS AND CONDITIONS Studies suggest that everyone can quit smoking. Your situation or condition can give you a special reason to quit.  Pregnant women/new mothers: By quitting, you protect your baby's health and your own.   Hospitalized patients: By quitting,  you reduce health problems and help healing.   Heart attack patients: By quitting, you reduce your risk of a second heart attack.   Lung, head, and neck cancer patients: By quitting, you reduce your chance of a second cancer.   Parents of children and adolescents: By quitting, you protect your children from illnesses caused by secondhand smoke.  QUESTIONS TO THINK ABOUT Think about the following questions before you try to stop smoking. You may want to talk about your answers with your caregiver.  Why do you want to quit?   If you tried to quit in the past, what helped and what did not?   What will be the most difficult situations for you after you quit? How will you plan to handle them?   Who can help you through the tough times? Your family? Friends? Caregiver?   What pleasures do you get from smoking? What ways can you still get pleasure if you quit?  Here are some questions to ask your caregiver:  How can you help me to be successful at quitting?   What medicine do you think would be best for me and how should I take it?   What should I do if I need more help?   What is smoking withdrawal like? How can I get information on withdrawal?  Quitting takes hard work and a lot of effort, but you can quit smoking.  Marland Kitchen

## 2015-07-28 NOTE — Progress Notes (Signed)
    Jasmin Stewart [birth year] [id number]        49 y.o.  [id number]  New patient for annual exam.  Has not seen a gynecologist in over 9 years. Several issues noted below.  Past medical history,surgical history, problem list, medications, allergies, family history and social history were all reviewed and documented as reviewed in the EPIC chart.  ROS:  Performed with pertinent positives and negatives included in the history, assessment and plan.   Additional significant findings :  none   Exam: [name] assistant Filed Vitals:   07/28/15 1458  BP: 124/76  Height: 5' 6.5" (1.689 m)  Weight: 247 lb (112.038 kg)   General appearance:  Normal affect, orientation and appearance. Skin: Grossly normal HEENT: Without gross lesions.  No cervical or supraclavicular adenopathy. Thyroid normal.  Lungs:  Clear without wheezing, rales or rhonchi Cardiac: RR, without RMG Abdominal:  Soft, nontender, without masses, guarding, rebound, organomegaly. 5 cm mid Pfannenstiel incisional hernia with protrusion of her hernia sac. Breasts:  Examined lying and sitting without masses, retractions, discharge or axillary adenopathy. Pelvic:  Ext/BUS/vagina normal  Cervix grossly normal height and vagina  Uterus anteverted, normal size, shape and contour, midline and mobile nontender   Adnexa without masses or tenderness    Anus and perineum normal   Rectovaginal normal sphincter tone without palpated masses or tenderness.    Assessment/Plan:  50 y.o. [id number] female for annual exam with regular menses, no contraception.   1. Contraception. Patient is sexually active and not using contraception. I reviewed the risks of pregnancy and available options. She does smoke and I do not think oral contraceptives wise. Progesterone only and IUDs were discussed. Patient not interested and states that if she would get pregnant she would accept this. 2. Incisional hernia. Patient has had 3 cesarean sections and apparently  developed a incisional hernia. She had this repaired in [place] but did not give it time to heal by her history doing heavy lifting before she should have and now has a recurrence.  Recommended patient consider evaluation for repair with general surgeon. Patient not interested in pursuing at this time but will call if she wants arranged. 3. Pap smear "many years ago". Pap smear done today. No history of abnormal Pap smears previously. 4. Mammography "many years ago". Strongly recommended patient schedule screening mammography and names and numbers provided and she agrees to do so. SBE monthly reviewed. 5. Stop smoking strategies reviewed and encouraged. 6. Health maintenance. Recently had CBC and comprehensive metabolic panel. Lipid profile urinalysis ordered today. Follow up in one year, sooner as needed.   Anastasio Auerbach MD, 3:46 PM 07/28/2015

## 2015-07-28 NOTE — Addendum Note (Signed)
Addended by: Nelva Nay on: 07/28/2015 04:13 PM   Modules accepted: Orders

## 2015-07-29 ENCOUNTER — Other Ambulatory Visit: Payer: Self-pay | Admitting: Gynecology

## 2015-07-29 DIAGNOSIS — E782 Mixed hyperlipidemia: Secondary | ICD-10-CM

## 2015-07-29 DIAGNOSIS — E7800 Pure hypercholesterolemia, unspecified: Secondary | ICD-10-CM

## 2015-07-29 LAB — URINALYSIS W MICROSCOPIC + REFLEX CULTURE
BILIRUBIN URINE: NEGATIVE
Bacteria, UA: NONE SEEN [HPF]
CASTS: NONE SEEN [LPF]
Crystals: NONE SEEN [HPF]
GLUCOSE, UA: NEGATIVE
KETONES UR: NEGATIVE
LEUKOCYTES UA: NEGATIVE
NITRITE: NEGATIVE
PH: 6 (ref 5.0–8.0)
Protein, ur: NEGATIVE
RBC / HPF: NONE SEEN RBC/HPF (ref <=2)
SPECIFIC GRAVITY, URINE: 1.026 (ref 1.001–1.035)
Yeast: NONE SEEN [HPF]

## 2015-07-29 LAB — PAP IG W/ RFLX HPV ASCU

## 2015-07-30 ENCOUNTER — Other Ambulatory Visit: Payer: Self-pay | Admitting: Gynecology

## 2015-07-30 ENCOUNTER — Telehealth: Payer: Self-pay | Admitting: *Deleted

## 2015-07-30 LAB — URINE CULTURE

## 2015-07-30 MED ORDER — METRONIDAZOLE 500 MG PO TABS: 500.0000 mg | ORAL_TABLET | Freq: Two times a day (BID) | ORAL | Status: DC

## 2015-07-30 NOTE — Telephone Encounter (Signed)
-----   Message from Jasmin Auerbach, MD sent at 07/30/2015  2:40 PM EDT ----- Call patient and tell her that the lab said they could not do the [facility] and chlamydia screen on her Pap smear that was sent over although they initially said they could. I would recommend the patient come in and let me do a screening just to be complete.

## 2015-07-30 NOTE — Telephone Encounter (Signed)
Pt informed with the below note, pt was just getting off work and will call on Monday to schedule the below note.

## 2015-08-10 NOTE — Telephone Encounter (Signed)
I called pt again regarding the below note, pt said she will check with work and call back to schedule.

## 2015-08-30 ENCOUNTER — Emergency Department (HOSPITAL_BASED_OUTPATIENT_CLINIC_OR_DEPARTMENT_OTHER): Payer: Managed Care, Other (non HMO)

## 2015-08-30 ENCOUNTER — Encounter (HOSPITAL_BASED_OUTPATIENT_CLINIC_OR_DEPARTMENT_OTHER): Payer: Self-pay | Admitting: *Deleted

## 2015-08-30 ENCOUNTER — Emergency Department (HOSPITAL_BASED_OUTPATIENT_CLINIC_OR_DEPARTMENT_OTHER)
Admission: EM | Admit: 2015-08-30 | Discharge: 2015-08-30 | Disposition: A | Discharge status: Home / Self Care | Payer: Managed Care, Other (non HMO) | Attending: Emergency Medicine | Admitting: Emergency Medicine

## 2015-08-30 DIAGNOSIS — F17210 Nicotine dependence, cigarettes, uncomplicated: Secondary | ICD-10-CM | POA: Diagnosis not present

## 2015-08-30 DIAGNOSIS — M542 Cervicalgia: Secondary | ICD-10-CM | POA: Diagnosis not present

## 2015-08-30 DIAGNOSIS — J189 Pneumonia, unspecified organism: Secondary | ICD-10-CM | POA: Diagnosis not present

## 2015-08-30 DIAGNOSIS — R05 Cough: Secondary | ICD-10-CM | POA: Diagnosis present

## 2015-08-30 MED ORDER — AEROCHAMBER PLUS FLO-VU MEDIUM MISC
1.0000 | Freq: Once | Status: DC
Start: 2015-08-30 — End: 2015-08-31
  Filled 2015-08-30: qty 1

## 2015-08-30 MED ORDER — ALBUTEROL SULFATE HFA 108 (90 BASE) MCG/ACT IN AERS
1.0000 | INHALATION_SPRAY | RESPIRATORY_TRACT | Status: DC | PRN
  Administered 2015-08-30: 2 via RESPIRATORY_TRACT
  Filled 2015-08-30: qty 6.7

## 2015-08-30 MED ORDER — LEVOFLOXACIN 750 MG PO TABS: 750.0000 mg | ORAL_TABLET | Freq: Every day | ORAL | Status: DC

## 2015-08-30 MED ORDER — DEXAMETHASONE 6 MG PO TABS
10.0000 mg | ORAL_TABLET | Freq: Once | ORAL | Status: AC
  Administered 2015-08-30: 10 mg via ORAL
  Filled 2015-08-30: qty 1

## 2015-08-30 NOTE — ED Notes (Signed)
Patient transported to X-ray ambulatory with tech. 

## 2015-08-30 NOTE — ED Provider Notes (Signed)
CSN: [id number]     Arrival date & time 08/30/15  1611 History  By signing my name below, I, Nicole Kindred, attest that this documentation has been prepared under the direction and in the presence of No att. providers found.   Electronically Signed: Nicole Kindred, ED Scribe. 09/05/2015. 9:03 PM   Chief Complaint  Patient presents with  . URI   The history is provided by the patient. No language interpreter was used.   HPI Comments: Jasmin Stewart is a 50 y.o. female with PMHx of esophagus ulcer who presents to the Emergency Department complaining of gradual onset, productive cough, ongoing for one week. Pt reports associated chest congestion, rhinorrhea, and mild neck pain. Pt reports mild chest tightness that has begun since arriving to the ED. No other associated symptoms noted. Pt has taken multiple OTC medications and home remedies with no relief to symptoms. No other worsening or alleviating factors noted. Pt denies wheezing, shortness of breath, or any other pertinent symptoms. Pt is current smoker.   Past Medical History  Diagnosis Date  . Hernia   . Pneumonia     2013,2014  . Hypoglycemia   . Hypercholesteremia   . Hypokalemia   . Ulcer of esophagus   . PVC (premature ventricular contraction)    Past Surgical History  Procedure Laterality Date  . Hernia repair    . Cesarean section    . Cesarean section    . Cesarean section     Family History  Problem Relation Age of Onset  . Thyroid disease Mother    Social History  Substance Use Topics  . Smoking status: Current Every Day Smoker -- 0.25 packs/day    Types: Cigarettes  . Smokeless tobacco: None  . Alcohol Use: No   OB History    Gravida Para Term Preterm AB TAB SAB Ectopic Multiple Living   7 4   3     4      Review of Systems  HENT: Positive for congestion and rhinorrhea.   Respiratory: Positive for cough and chest tightness. Negative for shortness of breath and wheezing.   Musculoskeletal: Positive for  neck pain.  All other systems reviewed and are negative.   Allergies  Review of patient's allergies indicates no known allergies.  Home Medications   Prior to Admission medications   Medication Sig Start Date End Date Taking? Authorizing Provider  Esomeprazole Magnesium (NEXIUM PO) Take by mouth.    Historical Provider, MD  HYDROcodone-homatropine (HYCODAN) 5-1.5 MG/5ML syrup Take 5 mLs by mouth every 6 (six) hours as needed for cough. 09/04/15   Malvin Johns, MD  ibuprofen (ADVIL,MOTRIN) 600 MG tablet Take 1 tablet (600 mg total) by mouth every 8 (eight) hours as needed. 07/16/15   Jola Schmidt, MD  levofloxacin (LEVAQUIN) 750 MG tablet Take 1 tablet (750 mg total) by mouth daily. 08/30/15   Harvel Quale, MD  metroNIDAZOLE (FLAGYL) 500 MG tablet Take 1 tablet (500 mg total) by mouth 2 (two) times daily with a meal. 07/30/15   Anastasio Auerbach, MD  predniSONE (DELTASONE) 20 MG tablet 2 tabs po daily x 4 days 09/04/15   Malvin Johns, MD   BP 156/83 mmHg  Pulse 78  Temp(Src) 98.3 F (36.8 C) (Oral)  Resp 18  Ht 5' 6.5" (1.689 m)  Wt 249 lb 4.8 oz (113.082 kg)  BMI 39.64 kg/m2  SpO2 100%  LMP 08/23/2015 Physical Exam  Constitutional: She appears well-developed and well-nourished. No distress.  HENT:  Head:  Normocephalic and atraumatic.  Right Ear: Tympanic membrane normal.  Left Ear: Tympanic membrane normal.  Nasal congestion and post nasal drip noted.   Eyes: Conjunctivae and EOM are normal.  Neck: Neck supple. No tracheal deviation present.  Cardiovascular: Normal rate, regular rhythm and normal heart sounds.  Exam reveals no gallop.   No murmur heard. Pulmonary/Chest: Effort normal. No respiratory distress. She has wheezes. She has no rales.  Wheezing on cough only. No wheezing with deep breathes.   Abdominal: Soft. She exhibits no distension. There is no tenderness.  Musculoskeletal: Normal range of motion.  Neurological: She is alert.  Skin: Skin is warm and dry.   Psychiatric: She has a normal mood and affect. Her behavior is normal.    ED Course  Procedures (including critical care time) DIAGNOSTIC STUDIES: Oxygen Saturation is 100% on RA, normal by my interpretation.    COORDINATION OF CARE: 7:29 PM Discussed treatment plan with pt at bedside and pt agreed to plan.   Labs Review Labs Reviewed - No data to display  Imaging Review Dg Chest 2 View  09/04/2015  CLINICAL DATA:  Worsening productive cough for 2 weeks.  Wheezing. EXAM: CHEST  2 VIEW COMPARISON:  Any 217 FINDINGS: Progressive bronchial thickening from prior exam. Minimal bibasilar opacities favor to be atelectasis. Cardiomediastinal contours are normal. Flat airspace disease, pleural effusion or pneumothorax. No acute osseous abnormalities. IMPRESSION: Progressive bronchial thickening. Bibasilar opacities, favor atelectasis. Electronically Signed   By: Jeb Levering M.D.   On: 09/04/2015 22:06   I have personally reviewed and evaluated these images and lab results as part of my medical decision-making.   EKG Interpretation None      MDM  Patient seen and evaluated in stable condition.  Overall well appearing.  Patient with sinus symptoms and some wheezing on examination.  Patient given breathing treatment, decadron.  Chest xray concerning for pneumonia.  Patient discharged in stable condition with prescriptions for levaquin and albuterol inhaler.  Strict return precautions given. Final diagnoses:  CAP (community acquired pneumonia)   I personally performed the services described in this documentation, which was scribed in my presence. The recorded information has been reviewed and is accurate.      Harvel Quale, MD 09/05/15 2105

## 2015-08-30 NOTE — Discharge Instructions (Signed)
You were seen and evaluated tonight for your cough and nasal congestion. This appears secondary to a mild case of pneumonia. Take the antibiotic prescribed. Use the inhaler as needed for cough and shortness of breath. You were given one dose of the long-acting steroid. You do not need to take any further steroids. Rest and drink lots of fluids.  Community-Acquired Pneumonia, Adult Pneumonia is an infection of the lungs. One type of pneumonia can happen while a person is in a hospital. A different type can happen when a person is not in a hospital (community-acquired pneumonia). It is easy for this kind to spread from person to person. It can spread to you if you breathe near an infected person who coughs or sneezes. Some symptoms include:  A dry cough.  A wet (productive) cough.  Fever.  Sweating.  Chest pain. HOME CARE  Take over-the-counter and prescription medicines only as told by your doctor.  Only take cough medicine if you are losing sleep.  If you were prescribed an antibiotic medicine, take it as told by your doctor. Do not stop taking the antibiotic even if you start to feel better.  Sleep with your head and neck raised (elevated). You can do this by putting a few pillows under your head, or you can sleep in a recliner.  Do not use tobacco products. These include cigarettes, chewing tobacco, and e-cigarettes. If you need help quitting, ask your doctor.  Drink enough water to keep your pee (urine) clear or pale yellow. A shot (vaccine) can help prevent pneumonia. Shots are often suggested for:  People older than 48 years of age.  People older than 48 years of age:  Who are having cancer treatment.  Who have long-term (chronic) lung disease.  Who have problems with their body's defense system (immune system). You may also prevent pneumonia if you take these actions:  Get the flu (influenza) shot every year.  Go to the dentist as often as told.  Wash your hands  often. If soap and water are not available, use hand sanitizer. GET HELP IF:  You have a fever.  You lose sleep because your cough medicine does not help. GET HELP RIGHT AWAY IF:  You are short of breath and it gets worse.  You have more chest pain.  Your sickness gets worse. This is very serious if:  You are an older adult.  Your body's defense system is weak.  You cough up blood.   This information is not intended to replace advice given to you by your health care provider. Make sure you discuss any questions you have with your health care provider.   Document Released: 09/13/2007 Document Revised: 12/16/2014 Document Reviewed: 07/22/2014 Elsevier Interactive Patient Education [employer].

## 2015-08-30 NOTE — ED Notes (Signed)
Cough, runny nose x 1 week. Symptoms are getting worse with OTC cold med.

## 2015-09-04 ENCOUNTER — Emergency Department (HOSPITAL_BASED_OUTPATIENT_CLINIC_OR_DEPARTMENT_OTHER): Payer: Managed Care, Other (non HMO)

## 2015-09-04 ENCOUNTER — Emergency Department (HOSPITAL_BASED_OUTPATIENT_CLINIC_OR_DEPARTMENT_OTHER)
Admission: EM | Admit: 2015-09-04 | Discharge: 2015-09-04 | Disposition: A | Discharge status: Home / Self Care | Payer: Managed Care, Other (non HMO) | Attending: Emergency Medicine | Admitting: Emergency Medicine

## 2015-09-04 DIAGNOSIS — R05 Cough: Secondary | ICD-10-CM | POA: Diagnosis present

## 2015-09-04 DIAGNOSIS — J40 Bronchitis, not specified as acute or chronic: Secondary | ICD-10-CM | POA: Diagnosis not present

## 2015-09-04 DIAGNOSIS — F17210 Nicotine dependence, cigarettes, uncomplicated: Secondary | ICD-10-CM | POA: Insufficient documentation

## 2015-09-04 MED ORDER — IPRATROPIUM-ALBUTEROL 0.5-2.5 (3) MG/3ML IN SOLN
3.0000 mL | Freq: Once | RESPIRATORY_TRACT | Status: AC
  Administered 2015-09-04: 3 mL via RESPIRATORY_TRACT
  Filled 2015-09-04: qty 3

## 2015-09-04 MED ORDER — ALBUTEROL SULFATE (2.5 MG/3ML) 0.083% IN NEBU
2.5000 mg | INHALATION_SOLUTION | Freq: Once | RESPIRATORY_TRACT | Status: AC
  Administered 2015-09-04: 2.5 mg via RESPIRATORY_TRACT
  Filled 2015-09-04: qty 3

## 2015-09-04 MED ORDER — PREDNISONE 20 MG PO TABS: ORAL_TABLET | ORAL | Status: DC

## 2015-09-04 MED ORDER — HYDROCODONE-HOMATROPINE 5-1.5 MG/5ML PO SYRP: 5.0000 mL | ORAL_SOLUTION | Freq: Four times a day (QID) | ORAL | Status: DC | PRN

## 2015-09-04 MED ORDER — PREDNISONE 50 MG PO TABS
60.0000 mg | ORAL_TABLET | Freq: Once | ORAL | Status: AC
  Administered 2015-09-04: 60 mg via ORAL
  Filled 2015-09-04: qty 1

## 2015-09-04 NOTE — Discharge Instructions (Signed)
Upper Respiratory Infection, Adult Most upper respiratory infections (URIs) are a viral infection of the air passages leading to the lungs. A URI affects the nose, throat, and upper air passages. The most common type of URI is nasopharyngitis and is typically referred to as "the common cold." URIs run their course and usually go away on their own. Most of the time, a URI does not require medical attention, but sometimes a bacterial infection in the upper airways can follow a viral infection. This is called a secondary infection. Sinus and middle ear infections are common types of secondary upper respiratory infections. Bacterial pneumonia can also complicate a URI. A URI can worsen asthma and chronic obstructive pulmonary disease (COPD). Sometimes, these complications can require emergency medical care and may be life threatening.  CAUSES Almost all URIs are caused by viruses. A virus is a type of germ and can spread from one person to another.  RISKS FACTORS You may be at risk for a URI if:   You smoke.   You have chronic heart or lung disease.  You have a weakened defense (immune) system.   You are very young or very old.   You have nasal allergies or asthma.  You work in crowded or poorly ventilated areas.  You work in health care facilities or schools. SIGNS AND SYMPTOMS  Symptoms typically develop 2-3 days after you come in contact with a cold virus. Most viral URIs last 7-10 days. However, viral URIs from the influenza virus (flu virus) can last 14-18 days and are typically more severe. Symptoms may include:   Runny or stuffy (congested) nose.   Sneezing.   Cough.   Sore throat.   Headache.   Fatigue.   Fever.   Loss of appetite.   Pain in your forehead, behind your eyes, and over your cheekbones (sinus pain).  Muscle aches.  DIAGNOSIS  Your health care provider may diagnose a URI by:  Physical exam.  Tests to check that your symptoms are not due to  another condition such as:  Strep throat.  Sinusitis.  Pneumonia.  Asthma. TREATMENT  A URI goes away on its own with time. It cannot be cured with medicines, but medicines may be prescribed or recommended to relieve symptoms. Medicines may help:  Reduce your fever.  Reduce your cough.  Relieve nasal congestion. HOME CARE INSTRUCTIONS   Take medicines only as directed by your health care provider.   Gargle warm saltwater or take cough drops to comfort your throat as directed by your health care provider.  Use a warm mist humidifier or inhale steam from a shower to increase air moisture. This may make it easier to breathe.  Drink enough fluid to keep your urine clear or pale yellow.   Eat soups and other clear broths and maintain good nutrition.   Rest as needed.   Return to work when your temperature has returned to normal or as your health care provider advises. You may need to stay home longer to avoid infecting others. You can also use a face mask and careful hand washing to prevent spread of the virus.  Increase the usage of your inhaler if you have asthma.   Do not use any tobacco products, including cigarettes, chewing tobacco, or electronic cigarettes. If you need help quitting, ask your health care provider. PREVENTION  The best way to protect yourself from getting a cold is to practice good hygiene.   Avoid oral or hand contact with people with cold   symptoms.   Wash your hands often if contact occurs.  There is no clear evidence that vitamin C, vitamin E, echinacea, or exercise reduces the chance of developing a cold. However, it is always recommended to get plenty of rest, exercise, and practice good nutrition.  SEEK MEDICAL CARE IF:   You are getting worse rather than better.   Your symptoms are not controlled by medicine.   You have chills.  You have worsening shortness of breath.  You have brown or red mucus.  You have yellow or brown nasal  discharge.  You have pain in your face, especially when you bend forward.  You have a fever.  You have swollen neck glands.  You have pain while swallowing.  You have white areas in the back of your throat. SEEK IMMEDIATE MEDICAL CARE IF:   You have severe or persistent:  Headache.  Ear pain.  Sinus pain.  Chest pain.  You have chronic lung disease and any of the following:  Wheezing.  Prolonged cough.  Coughing up blood.  A change in your usual mucus.  You have a stiff neck.  You have changes in your:  Vision.  Hearing.  Thinking.  Mood. MAKE SURE YOU:   Understand these instructions.  Will watch your condition.  Will get help right away if you are not doing well or get worse.   This information is not intended to replace advice given to you by your health care provider. Make sure you discuss any questions you have with your health care provider.   Document Released: 09/20/2000 Document Revised: 08/11/2014 Document Reviewed: 07/02/2013 Elsevier Interactive Patient Education 2016 Elsevier Inc[employer]  

## 2015-09-04 NOTE — ED Provider Notes (Signed)
CSN: [id number]     Arrival date & time 09/04/15  1952 History  By signing my name below, I, Georgette Shell, attest that this documentation has been prepared under the direction and in the presence of Malvin Johns, MD. Electronically Signed: Georgette Shell, ED Scribe. 09/04/2015. 9:37 PM.   Chief Complaint  Patient presents with  . Cough    The history is provided by the patient. No language interpreter was used.    HPI Comments: Jasmin Stewart is a 50 y.o. female with a h/x of asthma who presents to the Emergency Department complaining of a productive cough onset 2 weeks ago that worsened today. Patient states her sputum was initially green but is now clear. Patient also has associated generalized myalgias, chest tenderness secondary to cough. Patient was seen in the ED on 08/30/15 for the same symptoms, dx with PNA and was prescribed Levaquin and an albuterol inhaler which provided no relief. Pt's chest x-ray at previous visit showed minimal interstitial edema or possible infection. Per pt, her asthma tends to flare up with URI. Patient denies runny nose, congestion, nausea, vomiting.   Past Medical History  Diagnosis Date  . Hernia   . Pneumonia     2013,2014  . Hypoglycemia   . Hypercholesteremia   . Hypokalemia   . Ulcer of esophagus   . PVC (premature ventricular contraction)    Past Surgical History  Procedure Laterality Date  . Hernia repair    . Cesarean section    . Cesarean section    . Cesarean section     Family History  Problem Relation Age of Onset  . Thyroid disease Mother    Social History  Substance Use Topics  . Smoking status: Current Every Day Smoker -- 0.25 packs/day    Types: Cigarettes  . Smokeless tobacco: Not on file  . Alcohol Use: No   OB History    Gravida Para Term Preterm AB TAB SAB Ectopic Multiple Living   7 4   3     4      Review of Systems  Constitutional: Negative for fever, chills, diaphoresis and fatigue.  HENT: Negative for congestion,  rhinorrhea and sneezing.   Eyes: Negative.   Respiratory: Positive for cough. Negative for chest tightness and shortness of breath.   Cardiovascular: Positive for chest pain (secondary to cough). Negative for leg swelling.  Gastrointestinal: Negative for nausea, vomiting, abdominal pain, diarrhea and blood in stool.  Genitourinary: Negative for frequency, hematuria, flank pain and difficulty urinating.  Musculoskeletal: Positive for myalgias (generalized). Negative for back pain and arthralgias.  Skin: Negative for rash.  Neurological: Negative for dizziness, speech difficulty, weakness, numbness and headaches.      Allergies  Review of patient's allergies indicates no known allergies.  Home Medications   Prior to Admission medications   Medication Sig Start Date End Date Taking? Authorizing Provider  Esomeprazole Magnesium (NEXIUM PO) Take by mouth.    Historical Provider, MD  HYDROcodone-homatropine (HYCODAN) 5-1.5 MG/5ML syrup Take 5 mLs by mouth every 6 (six) hours as needed for cough. 09/04/15   Malvin Johns, MD  ibuprofen (ADVIL,MOTRIN) 600 MG tablet Take 1 tablet (600 mg total) by mouth every 8 (eight) hours as needed. 07/16/15   Jola Schmidt, MD  levofloxacin (LEVAQUIN) 750 MG tablet Take 1 tablet (750 mg total) by mouth daily. 08/30/15   Harvel Quale, MD  metroNIDAZOLE (FLAGYL) 500 MG tablet Take 1 tablet (500 mg total) by mouth 2 (two) times daily with a  meal. 07/30/15   Anastasio Auerbach, MD  predniSONE (DELTASONE) 20 MG tablet 2 tabs po daily x 4 days 09/04/15   Malvin Johns, MD   BP 120/86 mmHg  Pulse 110  Temp(Src) 98.2 F (36.8 C) (Oral)  Resp 22  Ht 5\' 6"  (1.676 m)  Wt 249 lb (112.946 kg)  BMI 40.21 kg/m2  SpO2 98%  LMP 08/23/2015 Physical Exam  Constitutional: She is oriented to person, place, and time. She appears well-developed and well-nourished.  HENT:  Head: Normocephalic and atraumatic.  Eyes: Pupils are equal, round, and reactive to light.  Neck:  Normal range of motion. Neck supple.  Cardiovascular: Normal rate, regular rhythm and normal heart sounds.   Pulmonary/Chest: Effort normal. No respiratory distress. She has wheezes. She has no rales. She exhibits no tenderness.  Moderate expiratory wheezing to all lung fields. Diminished breath sounds bilaterally.   Abdominal: Soft. Bowel sounds are normal. There is no tenderness. There is no rebound and no guarding.  Musculoskeletal: Normal range of motion. She exhibits no edema.  No edema or calf tenderness.  Lymphadenopathy:    She has no cervical adenopathy.  Neurological: She is alert and oriented to person, place, and time.  Skin: Skin is warm and dry. No rash noted.  Psychiatric: She has a normal mood and affect.  Nursing note and vitals reviewed.   ED Course  Procedures (including critical care time) DIAGNOSTIC STUDIES: Oxygen Saturation is 99% on RA, normal by my interpretation.    COORDINATION OF CARE: 9:24 PM Discussed treatment plan with pt at bedside which includes Prednisone, Rx for cough medicine, and CXR and pt agreed to plan.  Labs Review Labs Reviewed - No data to display  Imaging Review Dg Chest 2 View  09/04/2015  CLINICAL DATA:  Worsening productive cough for 2 weeks.  Wheezing. EXAM: CHEST  2 VIEW COMPARISON:  Any 217 FINDINGS: Progressive bronchial thickening from prior exam. Minimal bibasilar opacities favor to be atelectasis. Cardiomediastinal contours are normal. Flat airspace disease, pleural effusion or pneumothorax. No acute osseous abnormalities. IMPRESSION: Progressive bronchial thickening. Bibasilar opacities, favor atelectasis. Electronically Signed   By: Jeb Levering M.D.   On: 09/04/2015 22:06   I have personally reviewed and evaluated these images and lab results as part of my medical decision-making.   EKG Interpretation None      MDM   Final diagnoses:  Bronchitis    Patient presents with worsening cough and wheezing. Her chest  x-ray shows likely bronchitis changes. She is just finishing a course of Levaquin. She received 3 nebulizer treatments in the ED and is feeling much better. She's maintaining normal oxygen saturations. She has some mild tachycardia in the low 100s. I feel this is related to her nebulizer treatments. She doesn't have any symptoms that would be more suggestive of pulmonary embolus. Her lung sounds improved. She was discharged home in good condition. She was started on a prednisone burst. She was given her first dose in the emergency department. She does have an albuterol inhaler to use at home. She was also given a prescription for Hycodan cough syrup. Return precautions were given. She was encouraged to establish care with a primary care provider.  I personally performed the services described in this documentation, which was scribed in my presence.  The recorded information has been reviewed and considered.     Malvin Johns, MD 09/04/15 [id number]

## 2015-09-04 NOTE — ED Notes (Signed)
Patient transported to X-ray 

## 2015-09-04 NOTE — ED Notes (Signed)
Pt seen on Monday and dx'd with pneumonia. Sent home with rx for levaquin and has since developed a cough and states she is unable to sleep and feels worse now than on Monday.  Pt denies any OTC medications taken at home.

## 2015-09-11 ENCOUNTER — Encounter (HOSPITAL_BASED_OUTPATIENT_CLINIC_OR_DEPARTMENT_OTHER): Payer: Self-pay

## 2015-09-11 ENCOUNTER — Emergency Department (HOSPITAL_BASED_OUTPATIENT_CLINIC_OR_DEPARTMENT_OTHER)
Admission: EM | Admit: 2015-09-11 | Discharge: 2015-09-11 | Disposition: A | Discharge status: Home / Self Care | Payer: Managed Care, Other (non HMO) | Attending: Emergency Medicine | Admitting: Emergency Medicine

## 2015-09-11 ENCOUNTER — Emergency Department (HOSPITAL_BASED_OUTPATIENT_CLINIC_OR_DEPARTMENT_OTHER): Payer: Managed Care, Other (non HMO)

## 2015-09-11 DIAGNOSIS — F17210 Nicotine dependence, cigarettes, uncomplicated: Secondary | ICD-10-CM | POA: Insufficient documentation

## 2015-09-11 DIAGNOSIS — M436 Torticollis: Secondary | ICD-10-CM | POA: Diagnosis not present

## 2015-09-11 DIAGNOSIS — M6528 Calcific tendinitis, other site: Secondary | ICD-10-CM | POA: Insufficient documentation

## 2015-09-11 DIAGNOSIS — M6520 Calcific tendinitis, unspecified site: Secondary | ICD-10-CM

## 2015-09-11 DIAGNOSIS — M542 Cervicalgia: Secondary | ICD-10-CM | POA: Diagnosis present

## 2015-09-11 MED ORDER — KETOROLAC TROMETHAMINE 60 MG/2ML IM SOLN
60.0000 mg | Freq: Once | INTRAMUSCULAR | Status: AC
  Administered 2015-09-11: 60 mg via INTRAMUSCULAR
  Filled 2015-09-11: qty 2

## 2015-09-11 MED ORDER — DIAZEPAM 2 MG PO TABS
2.0000 mg | ORAL_TABLET | Freq: Once | ORAL | Status: AC
  Administered 2015-09-11: 2 mg via ORAL
  Filled 2015-09-11: qty 1

## 2015-09-11 NOTE — ED Provider Notes (Signed)
CSN: [id number]     Arrival date & time 09/11/15  0118 History   First MD Initiated Contact with Patient 09/11/15 [phone]     Chief Complaint  Patient presents with  . Neck Pain     (Consider location/radiation/quality/duration/timing/severity/associated sxs/prior Treatment) HPI Jasmin Stewart is a 50 y.o. female with no significant past medical history presenting today with worsening neck pain. Patient states she has had neck pain for the past 2 days. She was at work when this started. She describes pain in her bilateral posterior neck. It hurts worse to rotate her neck. She has never had this before. She denies any history of torticollis, for sleeping, poor posture, recent injury, with blasts a car accident. Patient states this came as no order. Her symptoms are relieved with ibuprofen but then it has come back worse. Pain in her posterior neck is worse with swallowing. She denies any anterior throat pain or sore throat. She has tried heating pads it as well without any relief. She recently finished a course of Levaquin and prednisone for bronchitis and pneumonia. Patient has no further complaints.  10 Systems reviewed and are negative for acute change except as noted in the HPI.     Past Medical History  Diagnosis Date  . Hernia   . Pneumonia     2013,2014  . Hypoglycemia   . Hypercholesteremia   . Hypokalemia   . Ulcer of esophagus   . PVC (premature ventricular contraction)    Past Surgical History  Procedure Laterality Date  . Hernia repair    . Cesarean section    . Cesarean section    . Cesarean section     Family History  Problem Relation Age of Onset  . Thyroid disease Mother    Social History  Substance Use Topics  . Smoking status: Current Every Day Smoker -- 0.25 packs/day    Types: Cigarettes  . Smokeless tobacco: None  . Alcohol Use: No   OB History    Gravida Para Term Preterm AB TAB SAB Ectopic Multiple Living   7 4   3     4      Review of  Systems    Allergies  Review of patient's allergies indicates no known allergies.  Home Medications   Prior to Admission medications   Medication Sig Start Date End Date Taking? Authorizing Provider  Esomeprazole Magnesium (NEXIUM PO) Take by mouth.    Historical Provider, MD  HYDROcodone-homatropine (HYCODAN) 5-1.5 MG/5ML syrup Take 5 mLs by mouth every 6 (six) hours as needed for cough. 09/04/15   Malvin Johns, MD  ibuprofen (ADVIL,MOTRIN) 600 MG tablet Take 1 tablet (600 mg total) by mouth every 8 (eight) hours as needed. 07/16/15   Jola Schmidt, MD  levofloxacin (LEVAQUIN) 750 MG tablet Take 1 tablet (750 mg total) by mouth daily. 08/30/15   Harvel Quale, MD  metroNIDAZOLE (FLAGYL) 500 MG tablet Take 1 tablet (500 mg total) by mouth 2 (two) times daily with a meal. 07/30/15   Anastasio Auerbach, MD  predniSONE (DELTASONE) 20 MG tablet 2 tabs po daily x 4 days 09/04/15   Malvin Johns, MD   BP 124/81 mmHg  Pulse 81  Temp(Src) 98.2 F (36.8 C) (Oral)  Ht 5\' 6"  (1.676 m)  Wt 247 lb 3 oz (112.124 kg)  BMI 39.92 kg/m2  LMP 08/23/2015 Physical Exam  Constitutional: She is oriented to person, place, and time. She appears well-developed and well-nourished. No distress.  HENT:  Head: Normocephalic  and atraumatic.  Nose: Nose normal.  Mouth/Throat: Oropharynx is clear and moist. No oropharyngeal exudate.  Eyes: Conjunctivae and EOM are normal. Pupils are equal, round, and reactive to light. No scleral icterus.  Neck: No JVD present. No tracheal deviation present. No thyromegaly present.  Limited range of motion due to pain in the posterior neck. There is no significant pain with palpation over these areas. No swelling or deformity seen.  Cardiovascular: Normal rate, regular rhythm and normal heart sounds.  Exam reveals no gallop and no friction rub.   No murmur heard. Pulmonary/Chest: Effort normal and breath sounds normal. No respiratory distress. She has no wheezes. She exhibits no  tenderness.  Abdominal: Soft. Bowel sounds are normal. She exhibits no distension and no mass. There is no tenderness. There is no rebound and no guarding.  Musculoskeletal: Normal range of motion. She exhibits no edema or tenderness.  Lymphadenopathy:    She has no cervical adenopathy.  Neurological: She is alert and oriented to person, place, and time. No cranial nerve deficit. She exhibits normal muscle tone.  Skin: Skin is warm and dry. No rash noted. No erythema. No pallor.  Nursing note and vitals reviewed.   ED Course  Procedures (including critical care time) Labs Review Labs Reviewed - No data to display  Imaging Review Dg Cervical Spine Complete  09/11/2015  CLINICAL DATA:  New posterior neck pain. Recent viral URI. No trauma. EXAM: CERVICAL SPINE - COMPLETE 4+ VIEW COMPARISON:  01/05/2013 FINDINGS: New calcific density below the anterior arch of C1 with increase in prevertebral thickening at this level. Findings favor calcific tendinitis of the longus coli. Disc degeneration with endplate spurring - greatest involvement at the C6-7 level. No evidence of fracture, endplate erosion, or focal bone lesion. IMPRESSION: 1. Prevertebral swelling and calcification favoring calcific tendinitis of the longus coli. Please ensure no active pharyngitis to explain prevertebral thickening. 2. Cervical disc degeneration greatest at C6-7. Electronically Signed   By: Monte Fantasia M.D.   On: 09/11/2015 03:33   I have personally reviewed and evaluated these images and lab results as part of my medical decision-making.   EKG Interpretation None      MDM   Final diagnoses:  Torticollis    Patient presents to emergency department for neck pain. She reviewed Valium, Toradol, and ice packs were applied to the area. Will obtain x-ray of her neck For further evaluation. Patient advised she may need MRI in the outpatient setting.  4:12 AM Upon repeat evaluation patient feels better.  Xrays results  discussed as well as treatment plan.  PCP fu advised.  She now has FROM of her neck. VS remain within her normal limits and she is safe for DC.  Everlene Balls, MD 09/11/15 [phone]

## 2015-09-11 NOTE — ED Notes (Signed)
Pt states pain is better after taking ibuprofen but it gets worse after it wears off

## 2015-09-11 NOTE — ED Notes (Signed)
C/o pain to back of neck onset yesterday  Now states is having pain when swallowing

## 2015-09-11 NOTE — ED Notes (Signed)
Pt c/o neck pain x2 days, dx with pna last week; states has a throbbing pain down the neck when swallowing

## 2015-09-11 NOTE — Discharge Instructions (Signed)
Acute Calcific Tendinitis of the Longus Colli Muscle Ms. Douty, your xray results are below.  Use ice packs at home for 15 min, three times per day.  Rest your neck muscles as best you can and take tylenol and ibuprofen as needed for pain.  See a primary care doctor in clinic within 3 days for repeat evaluation. If symptoms worsen, come back to the ED immediately. Thank you. IMPRESSION: 1. Prevertebral swelling and calcification favoring calcific tendinitis of the longus coli. Please ensure no active pharyngitis to explain prevertebral thickening. 2. Cervical disc degeneration greatest at C6-7. Torticollis is a condition in which the muscles of the neck tighten (contract) abnormally, causing the neck to twist and the head to move into an unnatural position. Torticollis that develops suddenly is called acute torticollis. If torticollis becomes chronic and is left untreated, the face and neck can become deformed. CAUSES This condition may be caused by:  Sleeping in an awkward position (common).  Extending or twisting the neck muscles beyond their normal position.  Infection. In some cases, the cause may not be known. SYMPTOMS Symptoms of this condition include:  An unnatural position of the head.  Neck pain.  A limited ability to move the neck.  Twisting of the neck to one side. DIAGNOSIS This condition is diagnosed with a physical exam. You may also have imaging tests, such as an X-ray, CT scan, or MRI. TREATMENT Treatment for this condition involves trying to relax the neck muscles. It may include:  Medicines or shots.  Physical therapy.  Surgery. This may be done in severe cases. HOME CARE INSTRUCTIONS  Take medicines only as directed by your health care provider.  Do stretching exercises and massage your neck as directed by your health care provider.  Keep all follow-up visits as directed by your health care provider. This is important. SEEK MEDICAL CARE IF:  You  develop a fever. SEEK IMMEDIATE MEDICAL CARE IF:  You develop difficulty breathing.  You develop noisy breathing (stridor).  You start drooling.  You have trouble swallowing or have pain with swallowing.  You develop numbness or weakness in your hands or feet.  You have changes in your speech, understanding, or vision.  Your pain gets worse.   This information is not intended to replace advice given to you by your health care provider. Make sure you discuss any questions you have with your health care provider.   Document Released: 03/24/2000 Document Revised: 08/11/2014 Document Reviewed: 03/23/2014 Elsevier Interactive Patient Education [employer].

## 2016-04-11 ENCOUNTER — Encounter (HOSPITAL_BASED_OUTPATIENT_CLINIC_OR_DEPARTMENT_OTHER): Payer: Self-pay | Admitting: *Deleted

## 2016-04-11 ENCOUNTER — Emergency Department (HOSPITAL_BASED_OUTPATIENT_CLINIC_OR_DEPARTMENT_OTHER)
Admission: EM | Admit: 2016-04-11 | Discharge: 2016-04-11 | Disposition: A | Discharge status: Home / Self Care | Payer: Managed Care, Other (non HMO) | Attending: Physician Assistant | Admitting: Physician Assistant

## 2016-04-11 DIAGNOSIS — F17210 Nicotine dependence, cigarettes, uncomplicated: Secondary | ICD-10-CM | POA: Insufficient documentation

## 2016-04-11 DIAGNOSIS — J0100 Acute maxillary sinusitis, unspecified: Secondary | ICD-10-CM | POA: Diagnosis not present

## 2016-04-11 DIAGNOSIS — J029 Acute pharyngitis, unspecified: Secondary | ICD-10-CM | POA: Diagnosis present

## 2016-04-11 LAB — RAPID STREP SCREEN (MED CTR MEBANE ONLY): STREPTOCOCCUS, GROUP A SCREEN (DIRECT): NEGATIVE

## 2016-04-11 MED ORDER — FLUTICASONE PROPIONATE 50 MCG/ACT NA SUSP: 2.0000 | Freq: Every day | NASAL | 2 refills | Status: DC

## 2016-04-11 NOTE — Discharge Instructions (Signed)
This is likely a viral sinusitis.  I have given you a prescription for Flonase a nasal steroid. You also need to get a nasal decongestant over the counter. Drink plenty of fluids. If symptoms last longer than 10 days. Return for possible anabiotic. You also can follow up with a primary care doctor.

## 2016-04-11 NOTE — ED Provider Notes (Signed)
[facility] DEPT [facility] Provider Note   CSN: [id number] Arrival date & time: 04/11/16  1344  By signing my name below, I, Dora Sims, attest that this documentation has been prepared under the direction and in the presence of Ocie Cornfield, PA-C. Electronically Signed: Dora Sims, Scribe. 04/11/2016. 4:35 PM.  History   Chief Complaint Chief Complaint  Patient presents with  . Sore Throat    The history is provided by the patient. No language interpreter was used.     HPI Comments: Jasmin Stewart is a 51 y.o. female who presents to the Emergency Department complaining of constant, gradually worsening, sore throat beginning 3 days ago. She states her sore throat is worse with swallowing and notes both sides of her throat are painful secondary to swallowing. She reports associated nasal congestion, postnasal drip, sinus pressure, and intermittent subjective fevers. She states she has noticed yellow mucus when she blows her nose. She has tried Mucinex DM with no improvement of her symptoms; she notes this medication did not include a nasal decongestant. She has also been eating soup and hydrating well with no relief of her symptoms. She has not tried any Tylenol or ibuprofen since onset. She reports her grandchild is currently sick with some similar symptoms. She states she contracts strep throat about once a year. She denies cough, chills, or any other associated symptoms.  Past Medical History:  Diagnosis Date  . Hernia   . Hypercholesteremia   . Hypoglycemia   . Hypokalemia   . Pneumonia    2013,2014  . PVC (premature ventricular contraction)   . Ulcer of esophagus     There are no active problems to display for this patient.   Past Surgical History:  Procedure Laterality Date  . CESAREAN SECTION    . CESAREAN SECTION    . CESAREAN SECTION    . HERNIA REPAIR      OB History    Gravida Para Term Preterm AB Living   7 4     3 4    SAB TAB Ectopic Multiple Live Births                     Home Medications    Prior to Admission medications   Medication Sig Start Date End Date Taking? Authorizing Provider  Esomeprazole Magnesium (NEXIUM PO) Take by mouth.    Historical Provider, MD  HYDROcodone-homatropine (HYCODAN) 5-1.5 MG/5ML syrup Take 5 mLs by mouth every 6 (six) hours as needed for cough. 09/04/15   Malvin Johns, MD  ibuprofen (ADVIL,MOTRIN) 600 MG tablet Take 1 tablet (600 mg total) by mouth every 8 (eight) hours as needed. 07/16/15   Jola Schmidt, MD  levofloxacin (LEVAQUIN) 750 MG tablet Take 1 tablet (750 mg total) by mouth daily. 08/30/15   Harvel Quale, MD  metroNIDAZOLE (FLAGYL) 500 MG tablet Take 1 tablet (500 mg total) by mouth 2 (two) times daily with a meal. 07/30/15   Anastasio Auerbach, MD  predniSONE (DELTASONE) 20 MG tablet 2 tabs po daily x 4 days 09/04/15   Malvin Johns, MD    Family History Family History  Problem Relation Age of Onset  . Thyroid disease Mother     Social History Social History  Substance Use Topics  . Smoking status: Current Every Day Smoker    Packs/day: 0.25    Types: Cigarettes  . Smokeless tobacco: Never Used  . Alcohol use No     Allergies   Patient has no known allergies.  Review of Systems Review of Systems  Constitutional: Positive for fever (subjective). Negative for chills.  HENT: Positive for congestion, postnasal drip, sinus pressure and sore throat.   Respiratory: Negative for cough.   All other systems reviewed and are negative.    Physical Exam Updated Vital Signs BP 119/78 (BP Location: Right Arm)   Pulse 84   Temp 97.9 F (36.6 C) (Oral)   Resp 18   Ht 5\' 6"  (1.676 m)   Wt 246 lb 2 oz (111.6 kg)   SpO2 99%   BMI 39.73 kg/m   Physical Exam  Constitutional: She is oriented to person, place, and time. She appears well-developed and well-nourished. No distress.  HENT:  Head: Normocephalic and atraumatic.  Right Ear: Tympanic membrane, external ear and ear canal  normal.  Left Ear: Tympanic membrane, external ear and ear canal normal.  Nose: Mucosal edema and rhinorrhea present. Right sinus exhibits maxillary sinus tenderness and frontal sinus tenderness. Left sinus exhibits maxillary sinus tenderness and frontal sinus tenderness.  Mouth/Throat: Uvula is midline and mucous membranes are normal. No trismus in the jaw. Posterior oropharyngeal erythema present. No oropharyngeal exudate, posterior oropharyngeal edema or tonsillar abscesses. Tonsils are 1+ on the right. Tonsils are 1+ on the left. No tonsillar exudate.  Eyes: Conjunctivae and EOM are normal.  Neck: Normal range of motion. Neck supple. No tracheal deviation present.  Cardiovascular: Normal rate, regular rhythm, normal heart sounds and intact distal pulses.  Exam reveals no gallop and no friction rub.   No murmur heard. Pulmonary/Chest: Effort normal and breath sounds normal. No respiratory distress. She has no wheezes.  Musculoskeletal: Normal range of motion.  Lymphadenopathy:    She has no cervical adenopathy.  Neurological: She is alert and oriented to person, place, and time.  Skin: Skin is warm and dry. Capillary refill takes less than 2 seconds.  Psychiatric: She has a normal mood and affect. Her behavior is normal.  Nursing note and vitals reviewed.    ED Treatments / Results  Labs (all labs ordered are listed, but only abnormal results are displayed) Labs Reviewed  RAPID STREP SCREEN (NOT AT [facility])  CULTURE, GROUP A STREP [facility])    EKG  EKG Interpretation None       Radiology Dg Chest 2 View  Result Date: 04/13/2016 CLINICAL DATA:  Cough congestion and fever for 5 days. EXAM: CHEST  2 VIEW COMPARISON:  09/04/2015 FINDINGS: The heart size and mediastinal contours are within normal limits. Both lungs are clear. The visualized skeletal structures are unremarkable. IMPRESSION: No active cardiopulmonary disease. Electronically Signed   By: Andreas Newport M.D.   On:  04/13/2016 05:50    Procedures Procedures (including critical care time)  DIAGNOSTIC STUDIES: Oxygen Saturation is 99% on RA, normal by my interpretation.    COORDINATION OF CARE: 4:45 PM Discussed treatment plan with pt at bedside and pt agreed to plan.  Medications Ordered in ED Medications - No data to display   Initial Impression / Assessment and Plan / ED Course  I have reviewed the triage vital signs and the nursing notes.  Pertinent labs & imaging results that were available during my care of the patient were reviewed by me and considered in my medical decision making (see chart for details).  Clinical Course   Patient complaining of symptoms of sinusitis.  Mild to moderate symptoms of clear/yellow nasal discharge/congestion and scratchy throat with cough for less than 10 days.  Patient is afebrile.  No concern for  acute bacterial rhinosinusitis; likely viral in nature. Pt afebrile without tonsillar exudate, negative strep. Presents with mild cervical lymphadenopathy, & dysphagia; diagnosis of viral pharyngitis. No abx indicated. DC w symptomatic tx for pain  Pt does not appear dehydrated, but did discuss importance of water rehydration. Presentation non concerning for PTA or infxn spread to soft tissue. No trismus or uvula deviation. Specific return precautions discussed. Pt able to drink water in ED without difficulty with intact air way. Recommended PCP follow up. Patient instructions given for warm saline nasal washes.  Recommendations for follow-up with primary care physician.     Final Clinical Impressions(s) / ED Diagnoses   Final diagnoses:  Acute non-recurrent maxillary sinusitis  Viral pharyngitis    New Prescriptions Discharge Medication List as of 04/11/2016  5:25 PM    START taking these medications   Details  fluticasone (FLONASE) 50 MCG/ACT nasal spray Place 2 sprays into both nostrils daily., Starting Tue 04/11/2016, Print       I personally performed the  services described in this documentation, which was scribed in my presence. The recorded information has been reviewed and is accurate.    Doristine Devoid, PA-C 04/13/16 2128    Budd Lake, MD 04/14/16 1615

## 2016-04-13 ENCOUNTER — Encounter (HOSPITAL_BASED_OUTPATIENT_CLINIC_OR_DEPARTMENT_OTHER): Payer: Self-pay

## 2016-04-13 ENCOUNTER — Emergency Department (HOSPITAL_BASED_OUTPATIENT_CLINIC_OR_DEPARTMENT_OTHER): Payer: Managed Care, Other (non HMO)

## 2016-04-13 ENCOUNTER — Emergency Department (HOSPITAL_BASED_OUTPATIENT_CLINIC_OR_DEPARTMENT_OTHER)
Admission: EM | Admit: 2016-04-13 | Discharge: 2016-04-13 | Disposition: A | Discharge status: Home / Self Care | Payer: Managed Care, Other (non HMO) | Attending: Emergency Medicine | Admitting: Emergency Medicine

## 2016-04-13 DIAGNOSIS — R05 Cough: Secondary | ICD-10-CM | POA: Diagnosis present

## 2016-04-13 DIAGNOSIS — J189 Pneumonia, unspecified organism: Secondary | ICD-10-CM | POA: Insufficient documentation

## 2016-04-13 DIAGNOSIS — F17210 Nicotine dependence, cigarettes, uncomplicated: Secondary | ICD-10-CM | POA: Insufficient documentation

## 2016-04-13 MED ORDER — HYDROCOD POLST-CPM POLST ER 10-8 MG/5ML PO SUER: 5.0000 mL | Freq: Two times a day (BID) | ORAL | 0 refills | Status: DC | PRN

## 2016-04-13 MED ORDER — ACETAMINOPHEN 500 MG PO TABS
1000.0000 mg | ORAL_TABLET | Freq: Once | ORAL | Status: AC
  Administered 2016-04-13: 1000 mg via ORAL
  Filled 2016-04-13: qty 2

## 2016-04-13 MED ORDER — ALBUTEROL SULFATE HFA 108 (90 BASE) MCG/ACT IN AERS
2.0000 | INHALATION_SPRAY | Freq: Once | RESPIRATORY_TRACT | Status: AC
  Administered 2016-04-13: 2 via RESPIRATORY_TRACT
  Filled 2016-04-13: qty 6.7

## 2016-04-13 MED ORDER — AZITHROMYCIN 250 MG PO TABS: 250.0000 mg | ORAL_TABLET | Freq: Every day | ORAL | 0 refills | Status: DC

## 2016-04-13 NOTE — ED Notes (Signed)
Pt verbalizes that she feels better, she was able to get about 20 minutes of sleep while in the room and verbalizes understanding of new prescriptions and OTC medication suggestions.  Pt denies any further needs at this time.

## 2016-04-13 NOTE — ED Notes (Signed)
Pt is complaining about previous visit in that she did not receive a steroid for her illness and that she "only got flonase, but flonase is kind of hard to use if you can't breathe through your nose."  Pt is upset that she apparently told her EDP from 1/2 what is helpful for her, but that she did not receive the medications requested.  Pt is also verbalizing frustration about having to repeat herself to multiple people and asks, "don't you people write anything down?  I mean, it's kind of assinine you know?"  Nurse explained to patient that the ED can be very repetitive and nurse apologized that pt does not feel well and that she did not seem to get what she needed from her previous visit, but that we would try to make her feel better today.

## 2016-04-13 NOTE — ED Provider Notes (Signed)
[facility]PT [facility] Provider Note   CSN: [id number] Arrival date & time: 04/13/16  0450     History   Chief Complaint Chief Complaint  Patient presents with  . Cough    HPI Jasmin Stewart is a 51 y.o. female.  HPI  51 year old female presents with cough. States every time this year she gets bronchitis. States she gets asthma "only when I'm sick". Smokes but has not the last few days due to cough with yellow sputum. Has felt hot and cold, no documented fevers. Has been having sinus congestion for 4-5 days, cough for the past 2. Tried mucinex, flonase, pseudophed with no relief. Lots of sinus pressure and headache as well. Sore throat that has come with coughing. Chest feels congested.   Past Medical History:  Diagnosis Date  . Hernia   . Hypercholesteremia   . Hypoglycemia   . Hypokalemia   . Pneumonia    2013,2014  . PVC (premature ventricular contraction)   . Ulcer of esophagus     There are no active problems to display for this patient.   Past Surgical History:  Procedure Laterality Date  . CESAREAN SECTION    . CESAREAN SECTION    . CESAREAN SECTION    . HERNIA REPAIR      OB History    Gravida Para Term Preterm AB Living   7 4     3 4    SAB TAB Ectopic Multiple Live Births                   Home Medications    Prior to Admission medications   Medication Sig Start Date End Date Taking? Authorizing Provider  azithromycin (ZITHROMAX) 250 MG tablet Take 1 tablet (250 mg total) by mouth daily. Take first 2 tablets together, then 1 every day until finished. 04/13/16   Sherwood Gambler, MD  chlorpheniramine-HYDROcodone (TUSSIONEX PENNKINETIC ER) 10-8 MG/5ML SUER Take 5 mLs by mouth every 12 (twelve) hours as needed for cough. 04/13/16   Sherwood Gambler, MD  Esomeprazole Magnesium (NEXIUM PO) Take by mouth.    Historical Provider, MD  fluticasone (FLONASE) 50 MCG/ACT nasal spray Place 2 sprays into both nostrils daily. 04/11/16   Doristine Devoid, PA-C  ibuprofen  (ADVIL,MOTRIN) 600 MG tablet Take 1 tablet (600 mg total) by mouth every 8 (eight) hours as needed. 07/16/15   Jola Schmidt, MD  predniSONE (DELTASONE) 20 MG tablet 2 tabs po daily x 4 days 09/04/15   Malvin Johns, MD    Family History Family History  Problem Relation Age of Onset  . Thyroid disease Mother     Social History Social History  Substance Use Topics  . Smoking status: Current Every Day Smoker    Packs/day: 0.25    Types: Cigarettes  . Smokeless tobacco: Never Used  . Alcohol use No     Allergies   Patient has no known allergies.   Review of Systems Review of Systems  Constitutional: Positive for chills and fever.  HENT: Positive for congestion, sinus pain, sinus pressure and sore throat. Negative for ear pain.   Respiratory: Positive for cough and shortness of breath.   Gastrointestinal: Negative for vomiting.  All other systems reviewed and are negative.    Physical Exam Updated Vital Signs BP 99/84 (BP Location: Right Arm)   Pulse 102   Temp 98.6 F (37 C) (Oral)   Resp 22   Ht 5' 6.5" (1.689 m)   Wt 246 lb (111.6  kg)   SpO2 93%   BMI 39.11 kg/m   Physical Exam  Constitutional: She is oriented to person, place, and time. She appears well-developed and well-nourished.  HENT:  Head: Normocephalic and atraumatic.  Right Ear: External ear normal.  Left Ear: External ear normal.  Nose: Nose normal.  Nasal congestion  Eyes: Right eye exhibits no discharge. Left eye exhibits no discharge.  Cardiovascular: Normal rate, regular rhythm and normal heart sounds.   Pulmonary/Chest: Effort normal. She has wheezes (slight inspiratory wheeze in RLL, does not clear with cough).  Abdominal: Soft. There is no tenderness.  Neurological: She is alert and oriented to person, place, and time.  Skin: Skin is warm and dry.  Nursing note and vitals reviewed.    ED Treatments / Results  Labs (all labs ordered are listed, but only abnormal results are  displayed) Labs Reviewed - No data to display  EKG  EKG Interpretation None       Radiology Dg Chest 2 View  Result Date: 04/13/2016 CLINICAL DATA:  Cough congestion and fever for 5 days. EXAM: CHEST  2 VIEW COMPARISON:  09/04/2015 FINDINGS: The heart size and mediastinal contours are within normal limits. Both lungs are clear. The visualized skeletal structures are unremarkable. IMPRESSION: No active cardiopulmonary disease. Electronically Signed   By: Andreas Newport M.D.   On: 04/13/2016 05:50    Procedures Procedures (including critical care time)  Medications Ordered in ED Medications  acetaminophen (TYLENOL) tablet 1,000 mg (1,000 mg Oral Given 04/13/16 0522)  albuterol (PROVENTIL HFA;VENTOLIN HFA) 108 (90 Base) MCG/ACT inhaler 2 puff (2 puffs Inhalation Given 04/13/16 0547)     Initial Impression / Assessment and Plan / ED Course  I have reviewed the triage vital signs and the nursing notes.  Pertinent labs & imaging results that were available during my care of the patient were reviewed by me and considered in my medical decision making (see chart for details).  Clinical Course     CXR is clear but on re-exam still has focal RLL wheezing. Will cover for atypical PNA with azithromycin. Tussionex for cough. No diffuse wheezing or signs of significant bronchospasm so I don't think steroids would be of benefit. Given albuterol inhaler for as needed. Has a PCP f/u next week. Discussed return precautions. Discussed other OTC congestion treatments such as afrin and saline sprays.  Final Clinical Impressions(s) / ED Diagnoses   Final diagnoses:  Atypical pneumonia    New Prescriptions New Prescriptions   AZITHROMYCIN (ZITHROMAX) 250 MG TABLET    Take 1 tablet (250 mg total) by mouth daily. Take first 2 tablets together, then 1 every day until finished.   CHLORPHENIRAMINE-HYDROCODONE (TUSSIONEX PENNKINETIC ER) 10-8 MG/5ML SUER    Take 5 mLs by mouth every 12 (twelve) hours as  needed for cough.     Sherwood Gambler, MD 04/13/16 [phone]

## 2016-04-13 NOTE — ED Triage Notes (Signed)
Pt c/o productive cough and congestion that is unrelieved from the flonase that was given to her at her visit at her visit on 04/11/16

## 2016-04-14 LAB — CULTURE, GROUP A STREP (THRC)

## 2016-04-28 ENCOUNTER — Encounter: Payer: Self-pay | Admitting: Medical

## 2016-04-28 ENCOUNTER — Ambulatory Visit (INDEPENDENT_AMBULATORY_CARE_PROVIDER_SITE_OTHER): Payer: Managed Care, Other (non HMO) | Admitting: Medical

## 2016-04-28 VITALS — BP 124/72 | HR 67 | Temp 98.2°F | Resp 16 | Ht 67.0 in | Wt 248.4 lb

## 2016-04-28 DIAGNOSIS — Z8639 Personal history of other endocrine, nutritional and metabolic disease: Secondary | ICD-10-CM | POA: Diagnosis not present

## 2016-04-28 DIAGNOSIS — K2210 Ulcer of esophagus without bleeding: Secondary | ICD-10-CM

## 2016-04-28 DIAGNOSIS — I493 Ventricular premature depolarization: Secondary | ICD-10-CM | POA: Diagnosis not present

## 2016-04-28 DIAGNOSIS — F17200 Nicotine dependence, unspecified, uncomplicated: Secondary | ICD-10-CM

## 2016-04-28 DIAGNOSIS — K439 Ventral hernia without obstruction or gangrene: Secondary | ICD-10-CM

## 2016-04-28 NOTE — Progress Notes (Signed)
Pre visit review using our clinic review tool, if applicable. No additional management support is needed unless otherwise documented below in the visit note/SLS  

## 2016-04-28 NOTE — Progress Notes (Signed)
Subjective:    Patient ID: Jasmin Stewart, female    DOB: 09/02/1965, 51 y.o.   MRN: [id number]  HPI  Pt in for evaluation.  Here for first time.  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 Has 4 children.(1 son and 3 daughters)  Pt has seen gynecologist. Pap smear nml. Mammogram was negative.  Pt has not had flu vaccine. She declines vaccine.  Pt over past month had sinus infection and dx atypical pneumonia. Pt was given azithromycin, tussionex and inhaler. She recovered from her prior illness. She states feels good now. She does clarify that ED never gave prednisone but did give flonase.  Pt has history of esophageal ulcer in 2006. Pt states biopsy done. Pt does take nexium for gerd and she states takes it very rarely.  Pt has history of hernia in suprapubic area. Bulges but no pain.   Pt has pvc history. But rare. Years ago after working extreme our for 2 weeks.  Pt has had low potassium in the past. She states she tried to supplement with bananas. Also when she tried high protein diet for weight loss had low sugar.  Pt is smoker. Now 1/2 pack a day.    Review of Systems  Constitutional: Negative for chills, fatigue and fever.  Respiratory: Negative for chest tightness, shortness of breath and wheezing.   Cardiovascular: Negative for chest pain and palpitations.  Gastrointestinal: Negative for abdominal pain, blood in stool and diarrhea.  Genitourinary: Negative for dysuria, enuresis, flank pain, frequency and urgency.  Musculoskeletal: Negative for back pain.  Skin: Negative for rash.  Neurological: Negative for dizziness and headaches.  Hematological: Negative for adenopathy. Does not bruise/bleed easily.  Psychiatric/Behavioral: Negative for behavioral problems and confusion.     Past Medical History:  Diagnosis Date  .  Bronchitis   . GERD (gastroesophageal reflux disease)   . Hernia 2006   Suprapubic, Surgery 2007 with mesh  . History of chicken pox    Age 89  . Hypercholesteremia   . Hypoglycemia   . Hypokalemia   . Pneumonia    2013,2014  . PVC (premature ventricular contraction)   . Ulcer of esophagus      Social History   Social History  . Marital status: Single    Spouse name: N/A  . Number of children: N/A  . Years of education: N/A   Occupational History  . Not on file.   Social History Main Topics  . Smoking status: Current Every Day Smoker    Packs/day: 0.25    Types: Cigarettes  . Smokeless tobacco: Never Used  . Alcohol use No  . Drug use: No  . Sexual activity: Yes    Birth control/ protection: , None     Comment: 1st intercourse 51 yo-More than 5 partners   Other Topics Concern  . Not on file   Social History Narrative  . No narrative on file    Past Surgical History:  Procedure Laterality Date  . CESAREAN SECTION    . CESAREAN SECTION    . CESAREAN SECTION    . HERNIA REPAIR  2007   Mesh surgery    Family History  Problem Relation Age of Onset  . Thyroid disease Mother   . Atrial fibrillation Mother   . Hyperlipidemia Mother   . Hyperlipidemia Sister   . Hyperlipidemia  Brother   . Thyroid disease Maternal Grandmother   . Glaucoma Maternal Grandmother   . Cataracts Maternal Grandmother   . Esophageal cancer Maternal Grandfather   . Hypertension Maternal Uncle   . Obesity Maternal Uncle   . Allergies Son     Hay Fever  . Healthy Daughter     No Known Allergies  Current Outpatient Prescriptions on File Prior to Visit  Medication Sig Dispense Refill  . chlorpheniramine-HYDROcodone (TUSSIONEX PENNKINETIC ER) 10-8 MG/5ML SUER Take 5 mLs by mouth every 12 (twelve) hours as needed for cough. 115 mL 0  . Esomeprazole Magnesium (NEXIUM PO) Take 20 mg by mouth daily as needed.     . fluticasone (FLONASE) 50 MCG/ACT nasal spray Place 2 sprays into both  nostrils daily. 16 g 2  . predniSONE (DELTASONE) 20 MG tablet 2 tabs po daily x 4 days (Patient not taking: Reported on 04/28/2016) 8 tablet 0   No current facility-administered medications on file prior to visit.     BP 124/72 (BP Location: Right Arm, Patient Position: Sitting, Cuff Size: Large)   Pulse 67   Temp 98.2 F (36.8 C) (Oral)   Resp 16   Ht 5\' 7"  (1.702 m)   Wt 248 lb 6 oz (112.7 kg)   SpO2 98%   BMI 38.90 kg/m       Objective:   Physical Exam   General Mental Status- Alert. General Appearance- Not in acute distress.   Skin General: Color- Normal Color. Moisture- Normal Moisture.  Neck Carotid Arteries- Normal color. Moisture- Normal Moisture. No carotid bruits. No JVD.  Chest and Lung Exam Auscultation: Breath Sounds:-Normal.  Cardiovascular Auscultation:Rythm- Regular. Murmurs & Other Heart Sounds:Auscultation of the heart reveals- No Murmurs.  Abdomen Inspection:-Inspeection Normal. Palpation/Percussion:Note:No mass. Palpation and Percussion of the abdomen reveal- Non Tender, Non Distended + BS, no rebound or guarding. Pt has lower abdomen moderate large bulge suprapubic region.    Neurologic Cranial Nerve exam:- CN III-XII intact(No nystagmus), symmetric smile. Strength:- 5/5 equal and symmetric strength both upper and lower extremities.     Assessment & Plan:   Sinusitis and pneumonia clinically resolved. No need for further treatment.  For your hx of esophageal ulcer will refer you to GI. You will likely get repeat EGD.  For your abdominal wall hernia/suprapubic region will refer you to general surgeon.(note pt aware of potential complications of hernias. If such were to occur pending specialist eval then she will go to ED)  For pvc history avoid caffeine use and decongestants.  Would recommend follow up visit within 2 wks  for CPE/wellness exam. Come in fasting that day. Schedule early am appointment.  Want you to consider stopping  smoking and may rx wellbutrin in future.

## 2016-04-28 NOTE — Patient Instructions (Addendum)
Sinusitis and pneumonia clinically resolved. No need for further treatment.  For your hx of esophageal ulcer will refer you to GI. You will likely get repeat EGD.  For your abdominal wall hernia/suprapubic region will refer you to general surgeon.  For pvc history avoid caffeine use and decongestants.  Would recommend follow up visit within 2 weeks for t CPE/wellness exam. Come in fasting that day. Schedule early am appointment.  Want you to consider stopping smoking and may rx wellbutrin in future.

## 2016-05-04 ENCOUNTER — Encounter (HOSPITAL_BASED_OUTPATIENT_CLINIC_OR_DEPARTMENT_OTHER): Payer: Self-pay | Admitting: *Deleted

## 2016-05-04 ENCOUNTER — Emergency Department (HOSPITAL_BASED_OUTPATIENT_CLINIC_OR_DEPARTMENT_OTHER): Payer: Managed Care, Other (non HMO)

## 2016-05-04 ENCOUNTER — Emergency Department (HOSPITAL_BASED_OUTPATIENT_CLINIC_OR_DEPARTMENT_OTHER)
Admission: EM | Admit: 2016-05-04 | Discharge: 2016-05-04 | Disposition: A | Discharge status: Home / Self Care | Payer: Managed Care, Other (non HMO) | Attending: Emergency Medicine | Admitting: Emergency Medicine

## 2016-05-04 DIAGNOSIS — J0100 Acute maxillary sinusitis, unspecified: Secondary | ICD-10-CM | POA: Insufficient documentation

## 2016-05-04 DIAGNOSIS — F17210 Nicotine dependence, cigarettes, uncomplicated: Secondary | ICD-10-CM | POA: Insufficient documentation

## 2016-05-04 DIAGNOSIS — R05 Cough: Secondary | ICD-10-CM | POA: Diagnosis present

## 2016-05-04 DIAGNOSIS — J40 Bronchitis, not specified as acute or chronic: Secondary | ICD-10-CM

## 2016-05-04 MED ORDER — ALBUTEROL SULFATE (2.5 MG/3ML) 0.083% IN NEBU
5.0000 mg | INHALATION_SOLUTION | Freq: Once | RESPIRATORY_TRACT | Status: AC
  Administered 2016-05-04: 5 mg via RESPIRATORY_TRACT
  Filled 2016-05-04: qty 6

## 2016-05-04 MED ORDER — AMOXICILLIN-POT CLAVULANATE 875-125 MG PO TABS: 1.0000 | ORAL_TABLET | Freq: Two times a day (BID) | ORAL | 0 refills | Status: DC

## 2016-05-04 MED ORDER — PREDNISONE 10 MG (21) PO TBPK: 10.0000 mg | ORAL_TABLET | Freq: Every day | ORAL | 0 refills | Status: DC

## 2016-05-04 MED FILL — AMOX-CLAV 875-125 MG TABLET: 875-125 | 10 days supply | Qty: 20 | Fill #0

## 2016-05-04 MED FILL — predniSONE 10 MG TABS: 10 | 12 days supply | Qty: 42 | Fill #0

## 2016-05-04 NOTE — Discharge Instructions (Signed)
Return to the ED with any concerns including difficulty breathing despite using albuterol every 4 hours, not drinking fluids, decreased urine output, vomiting and not able to keep down liquids or medications, decreased level of alertness/lethargy, or any other alarming symptoms °

## 2016-05-04 NOTE — ED Provider Notes (Signed)
[facility] Provider Note   CSN: [id number] Arrival date & time: 05/04/16  0720     History   Chief Complaint Chief Complaint  Patient presents with  . Cough    HPI Jasmin Stewart is a 51 y.o. female.  HPI  Pt presenting with c/o cough and sinus congestion.  She had similar illness approx 3 weeks ago and had gotten completely better, then 4 days ago began to have cough.  Cough is nonproductive and tight.  She has pain deep in chest with coughing.  2 days ago developed significant nasal congestion and this morning developed fever- took tylenol/decongestant that helped bring her fever down, but she has nasal congestion and facial pain.  No vomiting.  Has continued to drink liquids well.  There are no other associated systemic symptoms, there are no other alleviating or modifying factors.   Past Medical History:  Diagnosis Date  . Bronchitis   . Hernia 2006   Suprapubic, Surgery 2007 with mesh  . History of chicken pox    Age 51  . Hypercholesteremia   . Hypoglycemia   . Hypokalemia   . Pneumonia    2013,2014  . PVC (premature ventricular contraction)   . Ulcer of esophagus     There are no active problems to display for this patient.   Past Surgical History:  Procedure Laterality Date  . CESAREAN SECTION    . CESAREAN SECTION    . CESAREAN SECTION    . HERNIA REPAIR  2007   Mesh surgery    OB History    Gravida Para Term Preterm AB Living   7 4     3 4    SAB TAB Ectopic Multiple Live Births                   Home Medications    Prior to Admission medications   Medication Sig Start Date End Date Taking? Authorizing Provider  acetaminophen (TYLENOL) 500 MG tablet Take 500 mg by mouth every 6 (six) hours as needed.    Historical Provider, MD  albuterol (PROVENTIL HFA;VENTOLIN HFA) 108 (90 Base) MCG/ACT inhaler Inhale 1-2 puffs into the lungs every 6 (six) hours as needed for wheezing or shortness of breath. [facility]    Historical Provider, MD    amoxicillin-clavulanate (AUGMENTIN) 875-125 MG tablet Take 1 tablet by mouth every 12 (twelve) hours. 05/04/16   Alfonzo Beers, MD  chlorpheniramine-HYDROcodone (TUSSIONEX PENNKINETIC ER) 10-8 MG/5ML SUER Take 5 mLs by mouth every 12 (twelve) hours as needed for cough. 04/13/16   Sherwood Gambler, MD  Esomeprazole Magnesium (NEXIUM PO) Take 20 mg by mouth daily as needed.     Historical Provider, MD  fluticasone (FLONASE) 50 MCG/ACT nasal spray Place 2 sprays into both nostrils daily. 04/11/16   Doristine Devoid, PA-C  predniSONE (STERAPRED UNI-PAK 21 TAB) 10 MG (21) TBPK tablet Take 1 tablet (10 mg total) by mouth daily. Take 6 tabs by mouth daily  for 2 days, then 5 tabs for 2 days, then 4 tabs for 2 days, then 3 tabs for 2 days, 2 tabs for 2 days, then 1 tab by mouth daily for 2 days 05/04/16   Alfonzo Beers, MD    Family History Family History  Problem Relation Age of Onset  . Thyroid disease Mother   . Atrial fibrillation Mother   . Hyperlipidemia Mother   . Hyperlipidemia Sister   . Hyperlipidemia Brother   . Thyroid disease Maternal Grandmother   .  Glaucoma Maternal Grandmother   . Cataracts Maternal Grandmother   . Esophageal cancer Maternal Grandfather   . Hypertension Maternal Uncle   . Obesity Maternal Uncle   . Allergies Son     Hay Fever  . Healthy Daughter     Social History Social History  Substance Use Topics  . Smoking status: Current Every Day Smoker    Packs/day: 0.25    Types: Cigarettes  . Smokeless tobacco: Never Used  . Alcohol use No     Allergies   Patient has no known allergies.   Review of Systems Review of Systems  ROS reviewed and all otherwise negative except for mentioned in HPI   Physical Exam Updated Vital Signs BP 121/63 (BP Location: Right Arm)   Pulse 108   Temp 99.2 F (37.3 C) (Oral)   Resp 20   LMP 01/09/2016   SpO2 95%  Vitals reviewed Physical Exam Physical Examination: General appearance - alert, well appearing, and in no  distress Mental status - alert, oriented to person, place, and time Eyes -no conjunctival injection, no scleral icterus Mouth - mucous membranes moist, pharynx normal without lesions  Face- some ttp over maxillary sinuses bilaterally Neck - supple, no significant adenopathy Chest - clear to auscultation, no wheezes, rales or rhonchi, symmetric air entry Heart - normal rate, regular rhythm, normal S1, S2, no murmurs, rubs, clicks or gallops Abdomen - soft, nontender, nondistended, no masses or organomegaly Neurological - alert, oriented x 3, normal speech Extremities - peripheral pulses normal, no pedal edema, no clubbing or cyanosis Skin - normal coloration and turgor, no rashes  ED Treatments / Results  Labs (all labs ordered are listed, but only abnormal results are displayed) Labs Reviewed - No data to display  EKG  EKG Interpretation None       Radiology No results found.  Procedures Procedures (including critical care time)  Medications Ordered in ED Medications  albuterol (PROVENTIL) (2.5 MG/3ML) 0.083% nebulizer solution 5 mg (5 mg Nebulization Given 05/04/16 0820)     Initial Impression / Assessment and Plan / ED Course  I have reviewed the triage vital signs and the nursing notes.  Pertinent labs & imaging results that were available during my care of the patient were reviewed by me and considered in my medical decision making (see chart for details).     Pt presenting with cough, sinus pain and pressure, congestion.  CXR is c/w bronchitis.  Pt treated with albuterol neb in the ED for cough.  Will start on augmentin for sinusitis, given course of prednisone as well.  Discharged with strict return precautions.  Pt agreeable with plan.  Final Clinical Impressions(s) / ED Diagnoses   Final diagnoses:  Bronchitis  Acute maxillary sinusitis, recurrence not specified    New Prescriptions Discharge Medication List as of 05/04/2016  8:48 AM    START taking these  medications   Details  amoxicillin-clavulanate (AUGMENTIN) 875-125 MG tablet Take 1 tablet by mouth every 12 (twelve) hours., Starting Thu 05/04/2016, Print    predniSONE (STERAPRED UNI-PAK 21 TAB) 10 MG (21) TBPK tablet Take 1 tablet (10 mg total) by mouth daily. Take 6 tabs by mouth daily  for 2 days, then 5 tabs for 2 days, then 4 tabs for 2 days, then 3 tabs for 2 days, 2 tabs for 2 days, then 1 tab by mouth daily for 2 days, Starting Thu 05/04/2016, Print         Alfonzo Beers, MD 05/09/16 [phone]

## 2016-05-04 NOTE — ED Triage Notes (Signed)
C/o sob since Monday has seen MD and here x 3. C/o NP cough and congestion. C/o stuffy nose and feels like her eyes are hot. Low grade fever.

## 2016-05-11 ENCOUNTER — Ambulatory Visit (INDEPENDENT_AMBULATORY_CARE_PROVIDER_SITE_OTHER): Payer: Managed Care, Other (non HMO) | Admitting: Medical

## 2016-05-11 ENCOUNTER — Encounter: Payer: Self-pay | Admitting: Gynecology

## 2016-05-11 ENCOUNTER — Encounter: Payer: Self-pay | Admitting: Medical

## 2016-05-11 VITALS — BP 125/83 | HR 65 | Temp 98.0°F | Ht 67.0 in | Wt 249.2 lb

## 2016-05-11 DIAGNOSIS — R8290 Unspecified abnormal findings in urine: Secondary | ICD-10-CM

## 2016-05-11 DIAGNOSIS — M549 Dorsalgia, unspecified: Secondary | ICD-10-CM

## 2016-05-11 DIAGNOSIS — Z114 Encounter for screening for human immunodeficiency virus [HIV]: Secondary | ICD-10-CM | POA: Diagnosis not present

## 2016-05-11 DIAGNOSIS — Z0000 Encounter for general adult medical examination without abnormal findings: Secondary | ICD-10-CM | POA: Diagnosis not present

## 2016-05-11 LAB — CBC WITH DIFFERENTIAL/PLATELET
BASOS ABS: 0 10*3/uL (ref 0.0–0.1)
BASOS PCT: 0.3 % (ref 0.0–3.0)
EOS ABS: 0.1 10*3/uL (ref 0.0–0.7)
Eosinophils Relative: 0.9 % (ref 0.0–5.0)
HEMATOCRIT: 38.3 % (ref 36.0–46.0)
HEMOGLOBIN: 12.6 g/dL (ref 12.0–15.0)
LYMPHS PCT: 48.8 % — AB (ref 12.0–46.0)
Lymphs Abs: 7.8 10*3/uL — ABNORMAL HIGH (ref 0.7–4.0)
MCHC: 32.9 g/dL (ref 30.0–36.0)
MCV: 87.6 fl (ref 78.0–100.0)
MONO ABS: 0.9 10*3/uL (ref 0.1–1.0)
Monocytes Relative: 5.6 % (ref 3.0–12.0)
Neutro Abs: 7.1 10*3/uL (ref 1.4–7.7)
Neutrophils Relative %: 44.4 % (ref 43.0–77.0)
Platelets: 395 10*3/uL (ref 150.0–400.0)
RBC: 4.37 Mil/uL (ref 3.87–5.11)
RDW: 15.3 % (ref 11.5–15.5)
WBC: 16 10*3/uL — AB (ref 4.0–10.5)

## 2016-05-11 LAB — COMPREHENSIVE METABOLIC PANEL
ALBUMIN: 3.8 g/dL (ref 3.5–5.2)
ALT: 16 U/L (ref 0–35)
AST: 11 U/L (ref 0–37)
Alkaline Phosphatase: 59 U/L (ref 39–117)
BILIRUBIN TOTAL: 0.4 mg/dL (ref 0.2–1.2)
BUN: 17 mg/dL (ref 6–23)
CHLORIDE: 106 meq/L (ref 96–112)
CO2: 32 meq/L (ref 19–32)
CREATININE: 0.64 mg/dL (ref 0.40–1.20)
Calcium: 8.8 mg/dL (ref 8.4–10.5)
GFR: 127.02 mL/min (ref >60.00)
Glucose, Bld: 91 mg/dL (ref 70–99)
Potassium: 3.4 mEq/L — ABNORMAL LOW (ref 3.5–5.1)
SODIUM: 141 meq/L (ref 135–145)
Total Protein: 6.5 g/dL (ref 6.0–8.3)

## 2016-05-11 LAB — POC URINALSYSI DIPSTICK (AUTOMATED)
Bilirubin, UA: NEGATIVE
Glucose, UA: NEGATIVE
KETONES UA: NEGATIVE
Nitrite, UA: NEGATIVE
PH UA: 6
PROTEIN UA: NEGATIVE
SPEC GRAV UA: 1.025
UROBILINOGEN UA: NEGATIVE

## 2016-05-11 LAB — LIPID PANEL
Cholesterol: 181 mg/dL (ref 0–200)
HDL: 36.2 mg/dL — ABNORMAL LOW (ref >39.00)
LDL CALC: 107 mg/dL — AB (ref 0–99)
NONHDL: 144.82
Total CHOL/HDL Ratio: 5
Triglycerides: 190 mg/dL — ABNORMAL HIGH (ref 0.0–149.0)
VLDL: 38 mg/dL (ref 0.0–40.0)

## 2016-05-11 LAB — TSH: TSH: 1.76 u[IU]/mL (ref 0.35–4.50)

## 2016-05-11 LAB — HIV ANTIBODY (ROUTINE TESTING W REFLEX): HIV: NONREACTIVE

## 2016-05-11 MED ORDER — FLUCONAZOLE 150 MG PO TABS: ORAL_TABLET | ORAL | 3 refills | Status: DC

## 2016-05-11 MED ORDER — FLUTICASONE PROPIONATE HFA 110 MCG/ACT IN AERO: 2.0000 | INHALATION_SPRAY | Freq: Two times a day (BID) | RESPIRATORY_TRACT | 2 refills | Status: DC

## 2016-05-11 NOTE — Progress Notes (Signed)
Subjective:    Patient ID: Jasmin Stewart, female    DOB: 02/17/1966, 51 y.o.   MRN: [id number]  HPI  I have reviewed pt PMH, PSH, FH, Social History and Surgical History  Here for wellness exam. She states staff called her and reached out to her/scheduled.  Pt is fasting.  Update on recent ED visit. Pt got tapered prednisone and rx of amoxicillin. She is gradually improving after taking the prednisone properly. She is feeling better and sense of smell is coming back. Some wheezing in past post illness. Minimal second hand smoke exposure when she was young. Pt is a current smoker.  Pt works [employer] and [employer], Pt walks a lot daily at work, non smoker, Divorced. Has 4 children.(1 son and 3 daughters)  Pt has seen gynecologist. Pap smear nml. Mammogram was negative.(up to date on both)  Pt declines flu vaccine.     Review of Systems  Constitutional: Negative for chills, fatigue and fever.  HENT: Negative for congestion, ear pain, facial swelling, mouth sores, nosebleeds, postnasal drip, rhinorrhea, sinus pain and sinus pressure.   Respiratory: Positive for cough. Negative for chest tightness and wheezing.        Residual cough. She feels like getting better again. See ED visits.  Cardiovascular: Negative for chest pain and palpitations.  Gastrointestinal: Negative for abdominal pain.  Genitourinary: Negative for dysuria, flank pain and frequency.  Musculoskeletal: Negative for back pain and joint swelling.       At very end of exam noted transient intermittent mild lower back pain. Not constant seemed associated after long work schedule.(past 2 days)  Skin: Negative for rash.  Neurological: Negative for dizziness and headaches.  Hematological: Negative for adenopathy. Does not bruise/bleed easily.  Psychiatric/Behavioral: Negative for behavioral problems and confusion.    Past Medical History:  Diagnosis Date  . Bronchitis   . Hernia 2006   Suprapubic, Surgery 2007 with mesh  . History of chicken pox    Age 39  . Hypercholesteremia   . Hypoglycemia   . Hypokalemia   . Pneumonia    2013,2014  . PVC (premature ventricular contraction)   . Ulcer of esophagus      Social History   Social History  . Marital status: Single    Spouse name: N/A  . Number of children: N/A  . Years of education: N/A   Occupational History  . Not on file.   Social History Main Topics  . Smoking status: Current Every Day Smoker    Packs/day: 0.25    Types: Cigarettes  . Smokeless tobacco: Never Used  . Alcohol use No  . Drug use: No  . Sexual activity: Yes    Birth control/ protection: , None     Comment: 1st intercourse 51 yo-More than 5 partners   Other Topics Concern  . Not on file   Social History Narrative  . No narrative on file    Past Surgical History:  Procedure Laterality Date  . CESAREAN SECTION    . CESAREAN SECTION    . CESAREAN SECTION    . HERNIA REPAIR  2007   Mesh surgery    Family History  Problem Relation Age of Onset  . Thyroid disease Mother   . Atrial fibrillation Mother   . Hyperlipidemia Mother   . Hyperlipidemia Sister   . Hyperlipidemia Brother   . Thyroid disease Maternal Grandmother   . Glaucoma Maternal Grandmother   . Cataracts Maternal Grandmother   .  Esophageal cancer Maternal Grandfather   . Hypertension Maternal Uncle   . Obesity Maternal Uncle   . Allergies Son     Hay Fever  . Healthy Daughter     No Known Allergies  Current Outpatient Prescriptions on File Prior to Visit  Medication Sig Dispense Refill  . acetaminophen (TYLENOL) 500 MG tablet Take 500 mg by mouth every 6 (six) hours as needed.    Marland Kitchen albuterol (PROVENTIL HFA;VENTOLIN HFA) 108 (90 Base) MCG/ACT inhaler Inhale 1-2 puffs into the lungs every 6 (six) hours as needed for wheezing or shortness of breath. PROVENTIL HFA    . amoxicillin-clavulanate (AUGMENTIN) 875-125 MG tablet Take 1 tablet by mouth every 12  (twelve) hours. 20 tablet 0  . chlorpheniramine-HYDROcodone (TUSSIONEX PENNKINETIC ER) 10-8 MG/5ML SUER Take 5 mLs by mouth every 12 (twelve) hours as needed for cough. 115 mL 0  . Esomeprazole Magnesium (NEXIUM PO) Take 20 mg by mouth daily as needed.     . fluticasone (FLONASE) 50 MCG/ACT nasal spray Place 2 sprays into both nostrils daily. 16 g 2  . predniSONE (STERAPRED UNI-PAK 21 TAB) 10 MG (21) TBPK tablet Take 1 tablet (10 mg total) by mouth daily. Take 6 tabs by mouth daily  for 2 days, then 5 tabs for 2 days, then 4 tabs for 2 days, then 3 tabs for 2 days, 2 tabs for 2 days, then 1 tab by mouth daily for 2 days 42 tablet 0   No current facility-administered medications on file prior to visit.     BP 125/83   Pulse 65   Temp 98 F (36.7 C) (Oral)   Ht 5\' 7"  (1.702 m)   Wt 249 lb 3.2 oz (113 kg)   SpO2 97%   BMI 39.03 kg/m       Objective:   Physical Exam  General  Mental Status - Alert. General Appearance - Well groomed. Not in acute distress.  Skin Rashes- No Rashes.  Small moles on her back. No worrisome features.   HEENT Head- Normal. Ear Auditory Canal - Left- Normal. Right - Normal.Tympanic Membrane- Left- Normal. Right- Normal. Eye Sclera/Conjunctiva- Left- Normal. Right- Normal. Nose & Sinuses Nasal Mucosa- Left-  Boggy and Congested. Right-  Boggy and  Congested.Bilateral no  maxillary and no frontal sinus pressure. Mouth & Throat Lips: Upper Lip- Normal: no dryness, cracking, pallor, cyanosis, or vesicular eruption. Lower Lip-Normal: no dryness, cracking, pallor, cyanosis or vesicular eruption. Buccal Mucosa- Bilateral- No Aphthous ulcers. Oropharynx- No Discharge or Erythema. Tonsils: Characteristics- Bilateral- No Erythema or Congestion. Size/Enlargement- Bilateral- No enlargement. Discharge- bilateral-None.  Neck Neck- Supple. No Masses.   Chest and Lung Exam Auscultation: Breath Sounds:-Clear even and  unlabored.  Cardiovascular Auscultation:Rythm- Regular, rate and rhythm. Murmurs & Other Heart Sounds:Ausculatation of the heart reveal- No Murmurs.  Lymphatic Head & Neck General Head & Neck Lymphatics: Bilateral: Description- No Localized lymphadenopathy.    Neurologic Cranial Nerve exam:- CN III-XII intact(No nystagmus), symmetric smile. Strength:- 5/5 equal and symmetric strength both upper and lower extremities.   Abd- soft,nt, nd, +bs. No rebound or guarding.     Assessment & Plan:  For you wellness exam today I have ordered cbc, cmp, tsh, lipid panel, ua and hiv.  Vaccine offered today flu. Declined.  Recommend exercise and healthy diet.  We will let you know lab results as they come in.  Follow up date appointment will be determined after lab review.   For reactive airway disease possible will rx flovent to  use if after prednisone you to start to wheeze or you cough persists(notify us as well). Use albuterol if needed. Could rx medication to help you stop smoking. If wheezing and cough persist could refer to pulmonologist.  Follow up date to be determined after lab review.  Regarding back pain at end of interview. Asked to notify us if this returns and is constant. If so will get xray of lumbar spine. Rx nsaid and may give muscle relaxant. No pain presently.  Shaneque Merkle, Ramon Dredge, PA-C

## 2016-05-11 NOTE — Progress Notes (Signed)
Pre visit review using our clinic tool,if applicable. No additional management support is needed unless otherwise documented below in the visit note.  

## 2016-05-11 NOTE — Patient Instructions (Addendum)
For you wellness exam today I have ordered cbc, cmp, tsh, lipid panel, ua and hiv.  Vaccine offered today flu. Declined.  Recommend exercise and healthy diet.  We will let you know lab results as they come in.  Follow up date appointment will be determined after lab review.   For reactive airway disease possible will rx flovent to use if after prednisone you to start to wheeze or you cough persists(notify us as well). Use albuterol if needed. Could rx medication to help you stop smoking. If wheezing and cough persist could refer to pulmonologist.  Follow up date to be determined after lab review.   Preventive Care 40-64 Years, Female Preventive care refers to lifestyle choices and visits with your health care provider that can promote health and wellness. What does preventive care include?  A yearly physical exam. This is also called an annual well check.  Dental exams once or twice a year.  Routine eye exams. Ask your health care provider how often you should have your eyes checked.  Personal lifestyle choices, including:  Daily care of your teeth and gums.  Regular physical activity.  Eating a healthy diet.  Avoiding tobacco and drug use.  Limiting alcohol use.  Practicing safe sex.  Taking low-dose aspirin daily starting at age 72.  Taking vitamin and mineral supplements as recommended by your health care provider. What happens during an annual well check? The services and screenings done by your health care provider during your annual well check will depend on your age, overall health, lifestyle risk factors, and family history of disease. Counseling  Your health care provider may ask you questions about your:  Alcohol use.  Tobacco use.  Drug use.  Emotional well-being.  Home and relationship well-being.  Sexual activity.  Eating habits.  Work and work Statistician.  Method of birth control.  Menstrual cycle.  Pregnancy history. Screening  You  may have the following tests or measurements:  Height, weight, and BMI.  Blood pressure.  Lipid and cholesterol levels. These may be checked every 5 years, or more frequently if you are over 20 years old.  Skin check.  Lung cancer screening. You may have this screening every year starting at age 53 if you have a 30-pack-year history of smoking and currently smoke or have quit within the past 15 years.  Fecal occult blood test (FOBT) of the stool. You may have this test every year starting at age 76.  Flexible sigmoidoscopy or colonoscopy. You may have a sigmoidoscopy every 5 years or a colonoscopy every 10 years starting at age 41.  Hepatitis C blood test.  Hepatitis B blood test.  Sexually transmitted disease (STD) testing.  Diabetes screening. This is done by checking your blood sugar (glucose) after you have not eaten for a while (fasting). You may have this done every 1-3 years.  Mammogram. This may be done every 1-2 years. Talk to your health care provider about when you should start having regular mammograms. This may depend on whether you have a family history of breast cancer.  BRCA-related cancer screening. This may be done if you have a family history of breast, ovarian, tubal, or peritoneal cancers.  Pelvic exam and Pap test. This may be done every 3 years starting at age 38. Starting at age 69, this may be done every 5 years if you have a Pap test in combination with an HPV test.  Bone density scan. This is done to screen for osteoporosis. You may  have this scan if you are at high risk for osteoporosis. Discuss your test results, treatment options, and if necessary, the need for more tests with your health care provider. Vaccines  Your health care provider may recommend certain vaccines, such as:  Influenza vaccine. This is recommended every year.  Tetanus, diphtheria, and acellular pertussis (Tdap, Td) vaccine. You may need a Td booster every 10 years.  Varicella  vaccine. You may need this if you have not been vaccinated.  Zoster vaccine. You may need this after age 55.  Measles, mumps, and rubella (MMR) vaccine. You may need at least one dose of MMR if you were born in 1957 or later. You may also need a second dose.  Pneumococcal 13-valent conjugate (PCV13) vaccine. You may need this if you have certain conditions and were not previously vaccinated.  Pneumococcal polysaccharide (PPSV23) vaccine. You may need one or two doses if you smoke cigarettes or if you have certain conditions.  Meningococcal vaccine. You may need this if you have certain conditions.  Hepatitis A vaccine. You may need this if you have certain conditions or if you travel or work in places where you may be exposed to hepatitis A.  Hepatitis B vaccine. You may need this if you have certain conditions or if you travel or work in places where you may be exposed to hepatitis B.  Haemophilus influenzae type b (Hib) vaccine. You may need this if you have certain conditions. Talk to your health care provider about which screenings and vaccines you need and how often you need them. This information is not intended to replace advice given to you by your health care provider. Make sure you discuss any questions you have with your health care provider. Document Released: 04/23/2015 Document Revised: 12/15/2015 Document Reviewed: 01/26/2015 Elsevier Interactive Patient Education  2017 [employer].

## 2016-05-11 NOTE — Telephone Encounter (Signed)
rx diflucan sent to pharmacy. 

## 2016-05-12 LAB — URINE CULTURE: ORGANISM ID, BACTERIA: NO GROWTH

## 2016-05-15 ENCOUNTER — Encounter: Payer: Self-pay | Admitting: *Deleted

## 2016-05-15 NOTE — Progress Notes (Signed)
We have made attempts to reach pt by phone unsuccessfully; Letter mailed/SLS 02/05

## 2016-05-16 ENCOUNTER — Encounter: Payer: Self-pay | Admitting: Medical

## 2016-05-18 ENCOUNTER — Encounter: Payer: Self-pay | Admitting: Medical

## 2016-05-18 ENCOUNTER — Telehealth: Payer: Self-pay | Admitting: Medical

## 2016-05-18 NOTE — Telephone Encounter (Signed)
Caller name: Relationship to patient: Self Can be reached: [phone] Pharmacy:  [facility] [place] - [address] AT [address] [phone] (Phone) [phone] (Fax)     Reason for call: Patient states her insurance will not pay for the Flovent inhaler because they need a form filled out by her provider that was faxed. She also needs the Rx for Diflucan called in again because the pharmacy did not receive it. Please leave patient a message on her cell phone @ [phone]

## 2016-05-19 MED ORDER — FLUCONAZOLE 150 MG PO TABS: ORAL_TABLET | ORAL | 0 refills | Status: DC

## 2016-05-19 NOTE — Telephone Encounter (Signed)
Awaiting response on PA via Cover my Meds; had to make Addendum to Diagnosis. [place] and gave VO on Diflucan Rx. Patient informed, understood & agreed/SLS 02/09

## 2016-05-23 ENCOUNTER — Encounter: Payer: Self-pay | Admitting: Medical

## 2016-05-23 NOTE — Telephone Encounter (Signed)
Received Denial for [place] Flovent via [facility]; patient will need to contact her Insurance and inquire as to which medications ar eon their formulary list/SLS 02/13

## 2016-05-29 ENCOUNTER — Ambulatory Visit (INDEPENDENT_AMBULATORY_CARE_PROVIDER_SITE_OTHER): Payer: Managed Care, Other (non HMO) | Admitting: Medical

## 2016-05-29 ENCOUNTER — Telehealth: Payer: Self-pay | Admitting: Medical

## 2016-05-29 ENCOUNTER — Encounter: Payer: Self-pay | Admitting: Medical

## 2016-05-29 VITALS — BP 130/81 | HR 62 | Temp 98.1°F | Resp 16 | Ht 67.0 in | Wt 255.0 lb

## 2016-05-29 DIAGNOSIS — R9431 Abnormal electrocardiogram [ECG] [EKG]: Secondary | ICD-10-CM

## 2016-05-29 DIAGNOSIS — K219 Gastro-esophageal reflux disease without esophagitis: Secondary | ICD-10-CM | POA: Diagnosis not present

## 2016-05-29 DIAGNOSIS — J40 Bronchitis, not specified as acute or chronic: Secondary | ICD-10-CM | POA: Diagnosis not present

## 2016-05-29 DIAGNOSIS — R002 Palpitations: Secondary | ICD-10-CM | POA: Diagnosis not present

## 2016-05-29 DIAGNOSIS — R05 Cough: Secondary | ICD-10-CM | POA: Diagnosis not present

## 2016-05-29 DIAGNOSIS — R059 Cough, unspecified: Secondary | ICD-10-CM

## 2016-05-29 DIAGNOSIS — F17200 Nicotine dependence, unspecified, uncomplicated: Secondary | ICD-10-CM | POA: Diagnosis not present

## 2016-05-29 LAB — TROPONIN I: TNIDX: 0 ug/L (ref 0.00–0.06)

## 2016-05-29 LAB — CBC WITH DIFFERENTIAL/PLATELET
Basophils Absolute: 0.1 10*3/uL (ref 0.0–0.1)
Basophils Relative: 0.6 % (ref 0.0–3.0)
EOS PCT: 2.3 % (ref 0.0–5.0)
Eosinophils Absolute: 0.3 10*3/uL (ref 0.0–0.7)
HCT: 39.1 % (ref 36.0–46.0)
HEMOGLOBIN: 12.8 g/dL (ref 12.0–15.0)
Lymphocytes Relative: 34.1 % (ref 12.0–46.0)
Lymphs Abs: 4.3 10*3/uL — ABNORMAL HIGH (ref 0.7–4.0)
MCHC: 32.9 g/dL (ref 30.0–36.0)
MCV: 88.2 fl (ref 78.0–100.0)
MONO ABS: 0.6 10*3/uL (ref 0.1–1.0)
Monocytes Relative: 4.6 % (ref 3.0–12.0)
Neutro Abs: 7.4 10*3/uL (ref 1.4–7.7)
Neutrophils Relative %: 58.4 % (ref 43.0–77.0)
Platelets: 448 10*3/uL — ABNORMAL HIGH (ref 150.0–400.0)
RBC: 4.43 Mil/uL (ref 3.87–5.11)
RDW: 15.7 % — ABNORMAL HIGH (ref 11.5–15.5)
WBC: 12.6 10*3/uL — AB (ref 4.0–10.5)

## 2016-05-29 MED ORDER — ESOMEPRAZOLE MAGNESIUM 20 MG PO CPDR: 20.0000 mg | DELAYED_RELEASE_CAPSULE | Freq: Every day | ORAL | 3 refills | Status: DC | PRN

## 2016-05-29 MED ORDER — FLUTICASONE PROPIONATE HFA 110 MCG/ACT IN AERO: 2.0000 | INHALATION_SPRAY | Freq: Two times a day (BID) | RESPIRATORY_TRACT | 2 refills | Status: DC

## 2016-05-29 MED ORDER — BECLOMETHASONE DIPROPIONATE 80 MCG/ACT IN AERS: 2.0000 | INHALATION_SPRAY | Freq: Two times a day (BID) | RESPIRATORY_TRACT | 12 refills | Status: DC

## 2016-05-29 MED ORDER — BENZONATATE 100 MG PO CAPS: 100.0000 mg | ORAL_CAPSULE | Freq: Three times a day (TID) | ORAL | 0 refills | Status: DC | PRN

## 2016-05-29 MED ORDER — TRIAMCINOLONE ACETONIDE 55 MCG/ACT NA AERO: 2.0000 | INHALATION_SPRAY | Freq: Every day | NASAL | 12 refills | Status: DC

## 2016-05-29 MED FILL — BENZONATATE 100 MG CAP: 100 | 7 days supply | Qty: 21 | Fill #0

## 2016-05-29 MED FILL — QVAR 80 MCG ORAL INHALER: 80 | 30 days supply | Qty: 9 | Fill #0

## 2016-05-29 NOTE — Progress Notes (Signed)
Pre visit review using our clinic review tool, if applicable. No additional management support is needed unless otherwise documented below in the visit note/SLS  

## 2016-05-29 NOTE — Patient Instructions (Addendum)
For your cough can take benzonatate. I think this is smoker related cough. Good job on making effort to vape and not smoke.  You may have chronic bronchitis related to smoking. I don't think you need antibiotic again. But if worse cough or productive cough then get cxr.   Qvar rx today.. Sent down stairs. Albuterol to use only as needed. If you wheeze again daily/severe then ED evaluation.  For palpitation will include troponin study today.  If you get severe constant palpitation or change in pain/constant chest pain then ED evaluation.  You have possible short pr interval on EKG and histor of pvc. Will refer you to cardiologist.  Follow up in 7-10 days or as needed  Discussed her transient chest wall pain. Likely costochondritis. Can use tylenol or rare low dose ibuprofen.

## 2016-05-29 NOTE — Telephone Encounter (Signed)
LMOM that we were going to try Nasacort nasal spray, and to check with pharmacy first to make sure it wasn't also denied. Per pharmacy call, Nasacort also denied by insurance/SLS 02/19 [facility] with contact name and number [for return call, if needed] RE: Denial of Nasacort by Insurance and further information as to our options which would be purchasing OTC [prices lower at our pharmacy compared to others] and/or patient will need to call Rx Insurance and inquire as to the formularies coved and report those back to provider/SLS 02/19

## 2016-05-29 NOTE — Telephone Encounter (Signed)
Bowers in because she said that prescription for [facility] isn't covered by pt's insurance. She would like to be advised on a different med.   Please advise.

## 2016-05-29 NOTE — Progress Notes (Signed)
Subjective:    Patient ID: Jasmin Stewart, female    DOB: [birth year], 51 y.o.   MRN: [id number]  HPI   Pt in for follow up.    Pt still has some persistent RAD symptoms. I rx'd prednisone to her wheezing.Pt could not use the flovent since insurance did not pay for. They stated she need prior authorization. Some weight gain with prednisone use. Pt is a smoker. Pt just started vaping since I just saw her.   Pt wbc has been elevated 2 weeks ago. Pt by chest xray   has some chronic Insterstitial prominence with consistently bronchitis type findings. Pt last chest xray did not show pneumonia.  Pt has been taking Nexium otc for her reflux.   Pt states recently some minimal transient sharp chest pain on deep inspiration that last for 1-2 seconds then subsides. Pt also having some palpitation type feeling over weekend. Pt use to be on metoprolol. Pt was on metoprolol briefly in the past in 2012. [facility] wrote that in the past but no use since 2012 of metoprolol. Pt has a a APP that checks her pulse. Pt over weekend had resting pulse between 60-100. Last palpitation was on Saturday.    Review of Systems  Constitutional: Negative for chills and fatigue.  HENT: Negative for congestion, drooling, postnasal drip, sinus pain and sore throat.   Respiratory: Positive for cough and wheezing. Negative for chest tightness and shortness of breath.   Cardiovascular: Positive for palpitations. Negative for chest pain.  Gastrointestinal: Negative for abdominal pain, anal bleeding, blood in stool and vomiting.       Gerd type pain at times.  Genitourinary: Negative for dyspareunia and dysuria.  Musculoskeletal: Negative for back pain and neck pain.  Neurological: Negative for dizziness and light-headedness.  Hematological: Negative for adenopathy. Does not bruise/bleed easily.  Psychiatric/Behavioral: Negative for behavioral problems, decreased concentration and suicidal ideas. The patient is not  nervous/anxious.      Past Medical History:  Diagnosis Date  . Bronchitis   . Hernia 2006   Suprapubic, Surgery 2007 with mesh  . History of chicken pox    Age 58  . Hypercholesteremia   . Hypoglycemia   . Hypokalemia   . Pneumonia    2013,2014  . PVC (premature ventricular contraction)   . Ulcer of esophagus      Social History   Social History  . Marital status: Single    Spouse name: N/A  . Number of children: N/A  . Years of education: N/A   Occupational History  . Not on file.   Social History Main Topics  . Smoking status: Current Every Day Smoker    Packs/day: 0.25    Types: Cigarettes  . Smokeless tobacco: Never Used  . Alcohol use No  . Drug use: No  . Sexual activity: Yes    Birth control/ protection: , None     Comment: 1st intercourse 51 yo-More than 5 partners   Other Topics Concern  . Not on file   Social History Narrative  . No narrative on file    Past Surgical History:  Procedure Laterality Date  . CESAREAN SECTION    . CESAREAN SECTION    . CESAREAN SECTION    . HERNIA REPAIR  2007   Mesh surgery    Family History  Problem Relation Age of Onset  . Thyroid disease Mother   . Atrial fibrillation Mother   . Hyperlipidemia Mother   . Hyperlipidemia  Sister   . Hyperlipidemia Brother   . Thyroid disease Maternal Grandmother   . Glaucoma Maternal Grandmother   . Cataracts Maternal Grandmother   . Esophageal cancer Maternal Grandfather   . Hypertension Maternal Uncle   . Obesity Maternal Uncle   . Allergies Son     Hay Fever  . Healthy Daughter     No Known Allergies  Current Outpatient Prescriptions on File Prior to Visit  Medication Sig Dispense Refill  . acetaminophen (TYLENOL) 500 MG tablet Take 500 mg by mouth every 6 (six) hours as needed.    Marland Kitchen albuterol (PROVENTIL HFA;VENTOLIN HFA) 108 (90 Base) MCG/ACT inhaler Inhale 1-2 puffs into the lungs every 6 (six) hours as needed for wheezing or shortness of breath. PROVENTIL  HFA    . fluticasone (FLONASE) 50 MCG/ACT nasal spray Place 2 sprays into both nostrils daily. 16 g 2  . fluconazole (DIFLUCAN) 150 MG tablet Take 1 tab po day 1, take 1 tab po ON day 3 and 1 tab po on day 7. (Patient not taking: Reported on 05/29/2016) 3 tablet 0  . fluticasone (FLOVENT HFA) 110 MCG/ACT inhaler Inhale 2 puffs into the lungs 2 (two) times daily. (Patient not taking: Reported on 05/29/2016) 1 Inhaler 2   No current facility-administered medications on file prior to visit.     BP 130/81 (BP Location: Right Arm, Patient Position: Sitting, Cuff Size: Large)   Pulse 62   Temp 98.1 F (36.7 C) (Oral)   Resp 16   Ht 5\' 7"  (1.702 m)   Wt 255 lb (115.7 kg)   SpO2 97%   BMI 39.94 kg/m       Objective:   Physical Exam  General  Mental Status - Alert. General Appearance - Well groomed. Not in acute distress.  Skin Rashes- No Rashes.  HEENT Head- Normal. Ear Auditory Canal - Left- Normal. Right - Normal.Tympanic Membrane- Left- Normal. Right- Normal. Eye Sclera/Conjunctiva- Left- Normal. Right- Normal. Nose & Sinuses Nasal Mucosa- Left-  Boggy and Congested. Right-  Boggy and  Congested.Bilateral   No maxillary and no  frontal sinus pressure. Mouth & Throat Lips: Upper Lip- Normal: no dryness, cracking, pallor, cyanosis, or vesicular eruption. Lower Lip-Normal: no dryness, cracking, pallor, cyanosis or vesicular eruption. Buccal Mucosa- Bilateral- No Aphthous ulcers. Oropharynx- No Discharge or Erythema. Tonsils: Characteristics- Bilateral- No Erythema or Congestion. Size/Enlargement- Bilateral- No enlargement. Discharge- bilateral-None.  Neck Neck- Supple. No Masses.   Chest and Lung Exam Auscultation: Breath Sounds:-Clear even and unlabored.  Cardiovascular Auscultation:Rythm- Regular, rate and rhythm. Murmurs & Other Heart Sounds:Ausculatation of the heart reveal- No Murmurs.  Lymphatic Head & Neck General Head & Neck Lymphatics: Bilateral: Description-  No Localized lymphadenopathy.   Lower ext- no pedal edema. Negative homans signs,       Assessment & Plan:  Sinus rhythm but short pr interval on ekg.  For your cough can take benzonatate. I think this is smoker related cough. Good job on making effort to vape and not smoke.  You may have chronic bronchitis related to smoking. I don't think you need antibiotic again. But if worse cough or productive cough then get cxr.   RX qvar today.  Albuterol to use only as needed. If you wheeze again daily/severe  then ED evaluation.  For palpitation will include troponin study today.  If you get severe constant palpitation or change in pain/constant chest pain then ED evaluation.  You have possible short pr interval on EKG and histor of pvc.  Will refer you to cardiologist.  Follow up in 7-10 days or as needed  Discussed her transient chest wall pain. Likely costochondritis. Can use tylenol or rare low dose ibuprofen  Thanh Pomerleau, Ramon Dredge, PA-C

## 2016-05-30 NOTE — Telephone Encounter (Signed)
Spoke with pt regarding below message. States that she received flonase rx in January and had no problem getting rx filled. States her insurance did not cover the flovent but PCP had already sent QVAR as replacement and she has no further needs / concerns at this time.

## 2016-05-30 NOTE — Addendum Note (Signed)
Addended by: Kelle Darting A on: 05/30/2016 04:26 PM   Modules accepted: Orders

## 2016-06-09 ENCOUNTER — Ambulatory Visit (INDEPENDENT_AMBULATORY_CARE_PROVIDER_SITE_OTHER): Payer: BLUE CROSS/BLUE SHIELD | Admitting: Cardiovascular Disease

## 2016-06-09 ENCOUNTER — Encounter: Payer: Self-pay | Admitting: Cardiovascular Disease

## 2016-06-09 DIAGNOSIS — I493 Ventricular premature depolarization: Secondary | ICD-10-CM | POA: Diagnosis not present

## 2016-06-09 NOTE — Progress Notes (Signed)
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 Her cardiac risk factors include 10-20-pack-year history of tobacco abuse currently smoking one third pack per day but otherwise negative. She did have episode of palpitations back in May 2012 and had a Holter monitor that showed PVCs. She was on metoprolol for 6 months which was then discontinued. She recently had sinusitis and pneumonia was treated with bronchodilators and pseudoephedrine. She did notice some palpitations similar to her prior PVCs over these have since resolved and the left several weeks.   Current Outpatient Prescriptions  Medication Sig Dispense Refill  . acetaminophen (TYLENOL) 500 MG tablet Take 500 mg by mouth every 6 (six) hours as needed.    Marland Kitchen albuterol (PROVENTIL HFA;VENTOLIN HFA) 108 (90 Base) MCG/ACT inhaler Inhale 1-2 puffs into the lungs every 6 (six) hours as needed for wheezing or shortness of breath. PROVENTIL HFA    . beclomethasone (QVAR) 80 MCG/ACT inhaler Inhale 2 puffs into the lungs 2 (two) times daily. 1 Inhaler 12  . benzonatate (TESSALON) 100 MG capsule Take 1 capsule (100 mg total) by mouth 3 (three) times daily as needed. 21 capsule 0  . esomeprazole (NEXIUM) 20 MG capsule Take 1 capsule (20 mg total) by mouth daily as needed. 30 capsule 3  . fluconazole (DIFLUCAN) 150 MG tablet Take 1 tab po day 1, take 1 tab po ON day 3 and 1 tab po on day 7. 3 tablet 0  . fluticasone (FLONASE) 50 MCG/ACT nasal spray Place 2 sprays into both nostrils daily. 16 g 2   No current facility-administered medications for this visit.     No Known Allergies  Social History   Social History  . Marital status: Single    Spouse name: N/A  .  Number of children: N/A  . Years of education: N/A   Occupational History  . Not on file.   Social History Main Topics  . Smoking status: Current Every Day Smoker    Packs/day: 0.25    Types: Cigarettes  . Smokeless tobacco: Never Used  . Alcohol use No  . Drug use: No  . Sexual activity: Yes    Birth control/ protection: , None     Comment: 1st intercourse 51 yo-More than 5 partners   Other Topics Concern  . Not on file   Social History Narrative  . No narrative on file     Review of Systems: General: negative for chills, fever, night sweats or weight changes.  Cardiovascular: negative for chest pain, dyspnea on exertion, edema, orthopnea, palpitations, paroxysmal nocturnal dyspnea or shortness of breath Dermatological: negative for rash Respiratory: negative for cough or wheezing Urologic: negative for hematuria Abdominal: negative for nausea, vomiting, diarrhea, bright red blood per rectum, melena, or hematemesis Neurologic: negative for visual changes, syncope, or dizziness All other systems reviewed and are otherwise negative except as noted above.    Blood pressure 123/82, pulse 80, height 5\' 6"  (1.676 m), weight 254 lb 9.6 oz (115.5 kg), SpO2 95 %.  General appearance: alert and no distress Neck: no adenopathy, no carotid bruit, no JVD, supple, symmetrical, trachea midline and thyroid not enlarged, symmetric, no tenderness/mass/nodules Lungs: clear to auscultation bilaterally Heart: regular rate and rhythm,  S1, S2 normal, no murmur, click, rub or gallop Extremities: extremities normal, atraumatic, no cyanosis or edema  EKG not performed today  ASSESSMENT AND PLAN:   PVC's (premature ventricular contractions) [name] was referred to me by Dr.Saguier for evaluation of PVCs. She currently had palpitations back in 2012 and had a monitor in May 2012 that showed PVCs. She was on metoprolol for 6 months and then stopped. Recently she had sinusitis and pneumonia. She  was being treated with pseudoephedrine. She had palpitations similar to her PVCs in the past. She saw her primary care physician who apparently saw PVCs although it 12-lead EKG done on that day showed sinus rhythm without any ectopy. Over the last several weeks her palpitations have resolved spontaneously. No further workup is necessary at this time.      Lorretta Harp MD FACP,FACC,FAHA, [facility] 06/09/2016 3:31 PM

## 2016-06-09 NOTE — Assessment & Plan Note (Signed)
Ms. Schuth was referred to me by Dr.Saguier for evaluation of PVCs. She currently had palpitations back in 2012 and had a monitor in May 2012 that showed PVCs. She was on metoprolol for 6 months and then stopped. Recently she had sinusitis and pneumonia. She was being treated with pseudoephedrine. She had palpitations similar to her PVCs in the past. She saw her primary care physician who apparently saw PVCs although it 12-lead EKG done on that day showed sinus rhythm without any ectopy. Over the last several weeks her palpitations have resolved spontaneously. No further workup is necessary at this time.

## 2016-06-09 NOTE — Patient Instructions (Signed)
Medication Instructions: Your physician recommends that you continue on your current medications as directed. Please refer to the Current Medication list given to you today.   Follow-Up: Your physician recommends that you schedule a follow-up appointment as needed with Dr. Berry.  If you need a refill on your cardiac medications before your next appointment, please call your pharmacy.  

## 2016-06-15 ENCOUNTER — Ambulatory Visit (INDEPENDENT_AMBULATORY_CARE_PROVIDER_SITE_OTHER): Payer: BLUE CROSS/BLUE SHIELD | Admitting: Medical

## 2016-06-15 VITALS — BP 112/59 | HR 79 | Temp 98.0°F | Ht 66.0 in | Wt 253.4 lb

## 2016-06-15 DIAGNOSIS — F17200 Nicotine dependence, unspecified, uncomplicated: Secondary | ICD-10-CM

## 2016-06-15 DIAGNOSIS — R05 Cough: Secondary | ICD-10-CM

## 2016-06-15 DIAGNOSIS — R002 Palpitations: Secondary | ICD-10-CM | POA: Diagnosis not present

## 2016-06-15 DIAGNOSIS — R062 Wheezing: Secondary | ICD-10-CM | POA: Diagnosis not present

## 2016-06-15 DIAGNOSIS — Z6841 Body Mass Index (BMI) 40.0 and over, adult: Secondary | ICD-10-CM

## 2016-06-15 DIAGNOSIS — R059 Cough, unspecified: Secondary | ICD-10-CM

## 2016-06-15 DIAGNOSIS — E669 Obesity, unspecified: Secondary | ICD-10-CM

## 2016-06-15 DIAGNOSIS — IMO0001 Reserved for inherently not codable concepts without codable children: Secondary | ICD-10-CM

## 2016-06-15 NOTE — Progress Notes (Signed)
Subjective:    Patient ID: Jasmin Stewart, female    DOB: 05-Oct-1965, 51 y.o.   MRN: [id number]  HPI  Pt in today for follow up.  Pt had seen Dr. Allyson Sabal. Pt states on review he did not think any further work up is needed for her heart. No med rx'd. Palpitations have resolved. He states if symptoms reoccur he might do work up. He gave her business card.  Pt has been loosing weight. She has been eating better. She attritbutes weight may have been from steroids and eating more when she was on steroids. She is not eating after 7 pm.  She is vaping instead of smoking. Rarely will cheat and smoke.   Pt  breathing better. Less wheeze and less cough.    Review of Systems  Constitutional: Negative for chills, fatigue and fever.       Weight loss but purposeful  HENT: Negative for congestion, sinus pain and sinus pressure.   Respiratory: Negative for cough, chest tightness and wheezing.        Less cough/much improved. No sustained cough episodes.  Cardiovascular: Negative for chest pain and palpitations.  Gastrointestinal: Negative for abdominal pain, constipation, nausea and vomiting.  Genitourinary: Negative for dysuria.  Musculoskeletal: Negative for back pain.  Skin: Negative for rash.  Neurological: Negative for dizziness and headaches.  Hematological: Negative for adenopathy. Does not bruise/bleed easily.  Psychiatric/Behavioral: Negative for confusion.    Past Medical History:  Diagnosis Date  . Bronchitis   . Hernia 2006   Suprapubic, Surgery 2007 with mesh  . History of chicken pox    Age 71  . Hypercholesteremia   . Hypoglycemia   . Hypokalemia   . Pneumonia    2013,2014  . PVC (premature ventricular contraction)   . Ulcer of esophagus      Social History   Social History  . Marital status: Single    Spouse name: N/A  . Number of children: N/A  . Years of education: N/A   Occupational History  . Not on file.   Social History Main Topics  . Smoking status:  Current Every Day Smoker    Packs/day: 0.25    Types: Cigarettes  . Smokeless tobacco: Never Used  . Alcohol use No  . Drug use: No  . Sexual activity: Yes    Birth control/ protection: , None     Comment: 1st intercourse 51 yo-More than 5 partners   Other Topics Concern  . Not on file   Social History Narrative  . No narrative on file    Past Surgical History:  Procedure Laterality Date  . CESAREAN SECTION    . CESAREAN SECTION    . CESAREAN SECTION    . HERNIA REPAIR  2007   Mesh surgery    Family History  Problem Relation Age of Onset  . Thyroid disease Mother   . Atrial fibrillation Mother   . Hyperlipidemia Mother   . Hyperlipidemia Sister   . Hyperlipidemia Brother   . Thyroid disease Maternal Grandmother   . Glaucoma Maternal Grandmother   . Cataracts Maternal Grandmother   . Esophageal cancer Maternal Grandfather   . Hypertension Maternal Uncle   . Obesity Maternal Uncle   . Allergies Son     Hay Fever  . Healthy Daughter     No Known Allergies  Current Outpatient Prescriptions on File Prior to Visit  Medication Sig Dispense Refill  . acetaminophen (TYLENOL) 500 MG tablet Take 500 mg by  mouth every 6 (six) hours as needed.    Marland Kitchen albuterol (PROVENTIL HFA;VENTOLIN HFA) 108 (90 Base) MCG/ACT inhaler Inhale 1-2 puffs into the lungs every 6 (six) hours as needed for wheezing or shortness of breath. PROVENTIL HFA    . beclomethasone (QVAR) 80 MCG/ACT inhaler Inhale 2 puffs into the lungs 2 (two) times daily. 1 Inhaler 12  . benzonatate (TESSALON) 100 MG capsule Take 1 capsule (100 mg total) by mouth 3 (three) times daily as needed. 21 capsule 0  . esomeprazole (NEXIUM) 20 MG capsule Take 1 capsule (20 mg total) by mouth daily as needed. 30 capsule 3  . fluconazole (DIFLUCAN) 150 MG tablet Take 1 tab po day 1, take 1 tab po ON day 3 and 1 tab po on day 7. 3 tablet 0  . fluticasone (FLONASE) 50 MCG/ACT nasal spray Place 2 sprays into both nostrils daily. 16 g 2    No current facility-administered medications on file prior to visit.     BP (!) 112/59   Pulse 79   Temp 98 F (36.7 C) (Oral)   Ht 5\' 6"  (1.676 m)   Wt 253 lb 6.4 oz (114.9 kg)   SpO2 100%   BMI 40.90 kg/m       Objective:   Physical Exam  General  Mental Status - Alert. General Appearance - Well groomed. Not in acute distress.  Skin Rashes- No Rashes.  HEENT Head- Normal. Ear Auditory Canal - Left- Normal. Right - Normal.Tympanic Membrane- Left- Normal. Right- Normal. Eye Sclera/Conjunctiva- Left- Normal. Right- Normal. Nose & Sinuses Nasal Mucosa- Left-  Not  Boggy and Congested. Right-  Not  Boggy and  Congested.Bilateral no  maxillary and  No frontal sinus pressure. Mouth & Throat Lips: Upper Lip- Normal: no dryness, cracking, pallor, cyanosis, or vesicular eruption. Lower Lip-Normal: no dryness, cracking, pallor, cyanosis or vesicular eruption. Buccal Mucosa- Bilateral- No Aphthous ulcers. Oropharynx- No Discharge or Erythema. Tonsils: Characteristics- Bilateral- No Erythema or Congestion. Size/Enlargement- Bilateral- No enlargement. Discharge- bilateral-None.  Neck Neck- Supple. No Masses.   Chest and Lung Exam Auscultation: Breath Sounds:-Clear even and unlabored.  Cardiovascular Auscultation:Rythm- Regular, rate and rhythm. Murmurs & Other Heart Sounds:Ausculatation of the heart reveal- No Murmurs.  Lymphatic Head & Neck General Head & Neck Lymphatics: Bilateral: Description- No Localized lymphadenopathy.       Assessment & Plan:  I am glad palpitations have resolved. If they reoccur notify us and contact Dr. Allyson Sabal.  Any severe sustained then be seen same day/asap.  Keep eating healthy and exercise daily. Good job on weight loss.  Continue to stop smoking.  Coughing and wheezing much improved likely from decreased smoking.  Follow up in 3 months or as needed  Joshus Rogan, Ramon Dredge, [employer]

## 2016-06-15 NOTE — Patient Instructions (Addendum)
I am glad palpitations have resolved. If they reoccur notify us and contact Dr. Gwenlyn Found. Any severe sustained then be seen same day/asap.  Keep eating healthy and exercise daily. Good job on weight loss.  Continue to stop smoking.  Coughing and wheezing much improved likely from decreased smoking.  Follow up in 3 months or as needed

## 2016-06-15 NOTE — Progress Notes (Signed)
Pre visit review using our clinic tool,if applicable. No additional management support is needed unless otherwise documented below in the visit note.  

## 2016-06-17 IMAGING — CR DG CHEST 2V
2 series · 2 of 2 positions shown · non-contrast
Comparison: Any 217

CLINICAL DATA: Worsening productive cough for 2 weeks.  Wheezing.

EXAM:
CHEST  2 VIEW

[w chest pa]
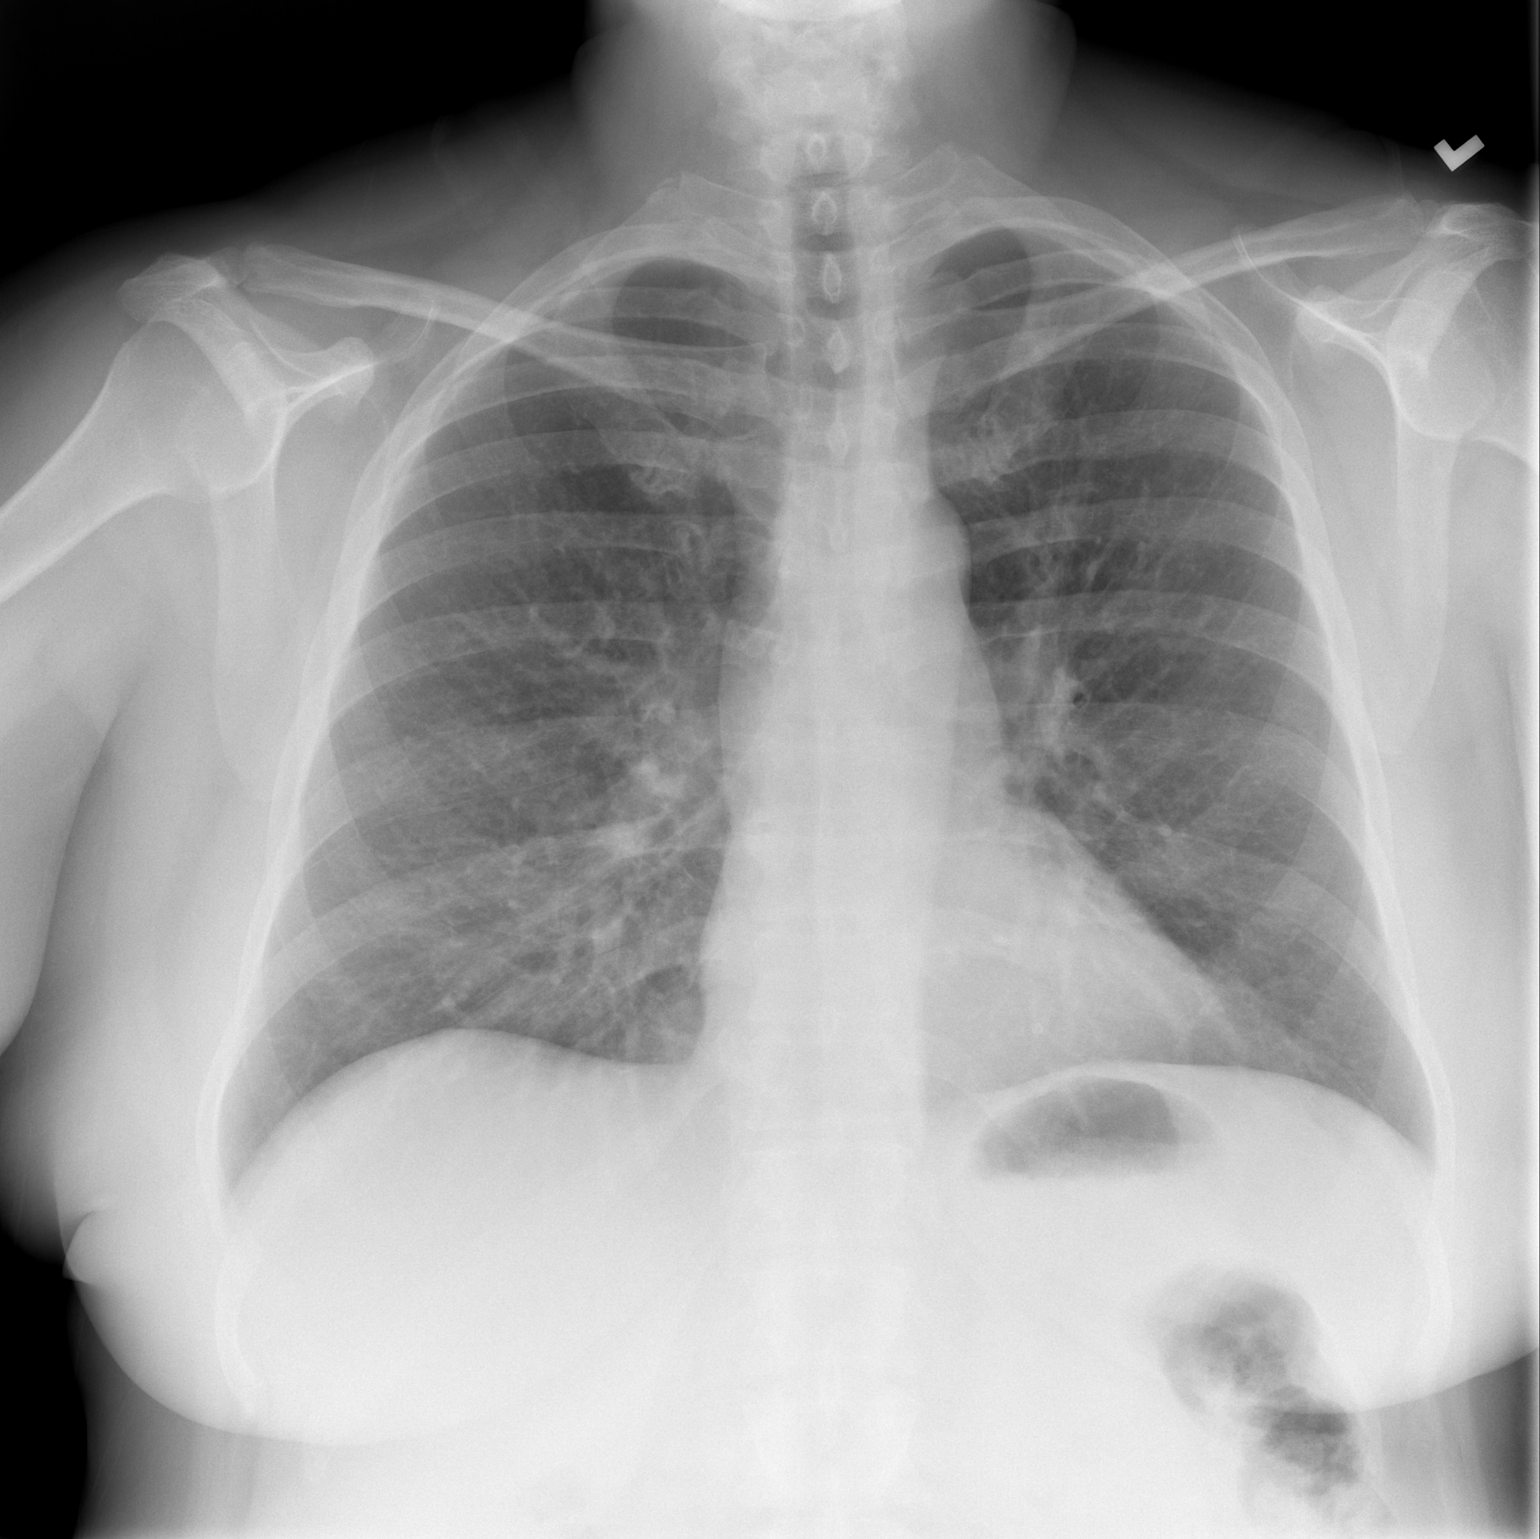

[w chest lat]
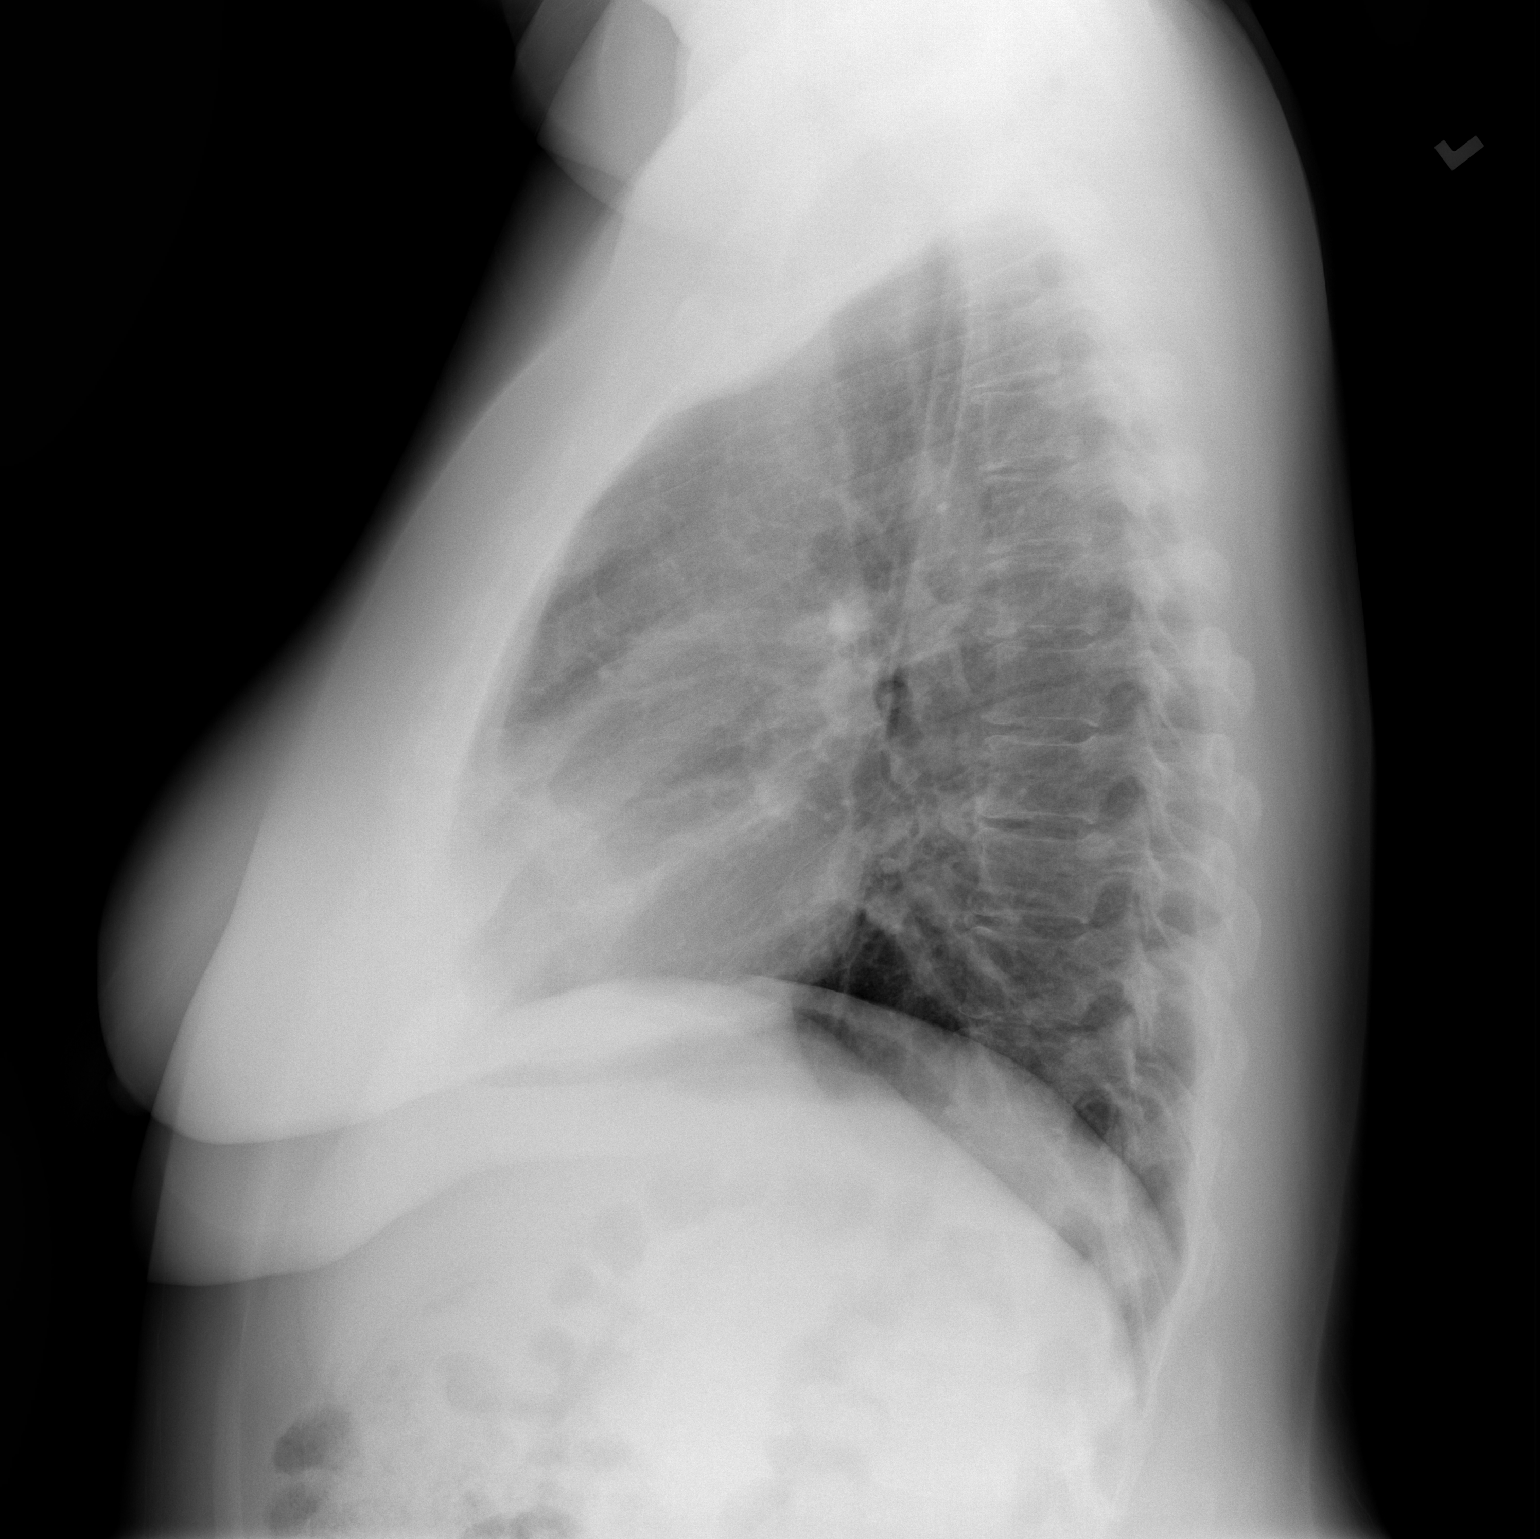

[2 of 2 positions shown; findings below may reference images not displayed]

FINDINGS: Progressive bronchial thickening from prior exam. Minimal bibasilar
opacities favor to be atelectasis. Cardiomediastinal contours are
normal. Flat airspace disease, pleural effusion or pneumothorax. No
acute osseous abnormalities.
IMPRESSION: Progressive bronchial thickening. Bibasilar opacities, favor
atelectasis.

## 2016-07-28 ENCOUNTER — Ambulatory Visit (INDEPENDENT_AMBULATORY_CARE_PROVIDER_SITE_OTHER): Payer: BLUE CROSS/BLUE SHIELD | Admitting: Gynecology

## 2016-07-28 ENCOUNTER — Encounter: Payer: Self-pay | Admitting: Gynecology

## 2016-07-28 VITALS — BP 114/74 | Ht 66.5 in | Wt 247.0 lb

## 2016-07-28 DIAGNOSIS — K432 Incisional hernia without obstruction or gangrene: Secondary | ICD-10-CM | POA: Diagnosis not present

## 2016-07-28 DIAGNOSIS — N898 Other specified noninflammatory disorders of vagina: Secondary | ICD-10-CM

## 2016-07-28 DIAGNOSIS — A599 Trichomoniasis, unspecified: Secondary | ICD-10-CM | POA: Diagnosis not present

## 2016-07-28 DIAGNOSIS — Z01411 Encounter for gynecological examination (general) (routine) with abnormal findings: Secondary | ICD-10-CM

## 2016-07-28 LAB — WET PREP FOR TRICH, YEAST, CLUE: YEAST WET PREP: NONE SEEN

## 2016-07-28 MED ORDER — METRONIDAZOLE 500 MG PO TABS: 500.0000 mg | ORAL_TABLET | Freq: Two times a day (BID) | ORAL | 0 refills | Status: DC

## 2016-07-28 NOTE — Patient Instructions (Signed)
Take the Flagyl medication twice daily for 7 days.  Make sure your partners are seen by their doctors to be treated for trichomonas.

## 2016-07-28 NOTE — Addendum Note (Signed)
Addended by: Nelva Nay on: 07/28/2016 04:26 PM   Modules accepted: Orders

## 2016-07-28 NOTE — Progress Notes (Signed)
    Jasmin Stewart Feb 23, 1966 [id number]        50 y.o.  [id number] for annual exam.    Past medical history,surgical history, problem list, medications, allergies, family history and social history were all reviewed and documented as reviewed in the EPIC chart.  ROS:  Performed with pertinent positives and negatives included in the history, assessment and plan.   Additional significant findings :  None   Exam: [name] assistant Vitals:   07/28/16 1548  BP: 114/74  Weight: 247 lb (112 kg)  Height: 5' 6.5" (1.689 m)   Body mass index is 39.27 kg/m.  General appearance:  Normal affect, orientation and appearance. Skin: Grossly normal HEENT: Without gross lesions.  No cervical or supraclavicular adenopathy. Thyroid normal.  Lungs:  Clear without wheezing, rales or rhonchi Cardiac: RR, without RMG Abdominal:  Soft, nontender, without masses, guarding, rebound, organomegaly. Midline incisional hernia noted Breasts:  Examined lying and sitting without masses, retractions, discharge or axillary adenopathy. Pelvic:  Ext, BUS, Vagina: With heavy white discharge  Cervix: Normal. Pap smear done. GC/Chlamydia done  Uterus anteverted, normal size, shape and contour, midline and mobile nontender   Adnexa: Without masses or tenderness    Anus and perineum: Normal   Rectovaginal: Normal sphincter tone without palpated masses or tenderness.    Assessment/Plan:  51 y.o. [id number] female for annual exawith regular menses, no consistent birth control  1. Heavy white discharge. History of Trichomonas on Pap smear last year and was treated with Flagyl. Had added GC/Chlamydia to the Pap smear screen but apparently was not done. Wet prep today does show trichomonas and many clue cells. Will treat with Flagyl 500 mg twice a day 7 days. Need to have her partner treated also discussed. GC/chlamydia done today. 2. No consistent contraception. Patient understands failure risks and declines birth  control 3. Pap smear 2017. Pap smear done today as she has not had them done in a number of years and I wanted 2 in a row normal before going to less frequent screening. 4. Mammography 11/2015. Continue with annual mammography when due. 5. Colonoscopy in the process of being arranged. 6. Large incisional hernia. The patient is in the process of arranging an appointment with a general surgeon for surgical correction. 7. Health maintenance. Patient actively being followed by her primary physician. No lab work done as this is all being done through their office. Follow up in one year, sooner as needed.     Anastasio Auerbach MD, 4:08 PM 07/28/2016

## 2016-07-29 LAB — GC/CHLAMYDIA PROBE AMP
CT PROBE, AMP APTIMA: NOT DETECTED
GC PROBE AMP APTIMA: NOT DETECTED

## 2016-07-31 LAB — PAP IG W/ RFLX HPV ASCU

## 2016-09-04 ENCOUNTER — Encounter (HOSPITAL_BASED_OUTPATIENT_CLINIC_OR_DEPARTMENT_OTHER): Payer: Self-pay | Admitting: *Deleted

## 2016-09-04 ENCOUNTER — Emergency Department (HOSPITAL_BASED_OUTPATIENT_CLINIC_OR_DEPARTMENT_OTHER)
Admission: EM | Admit: 2016-09-04 | Discharge: 2016-09-04 | Disposition: A | Discharge status: Home / Self Care | Payer: BLUE CROSS/BLUE SHIELD | Attending: Emergency Medicine | Admitting: Emergency Medicine

## 2016-09-04 DIAGNOSIS — M778 Other enthesopathies, not elsewhere classified: Secondary | ICD-10-CM | POA: Diagnosis not present

## 2016-09-04 DIAGNOSIS — M65241 Calcific tendinitis, right hand: Secondary | ICD-10-CM | POA: Insufficient documentation

## 2016-09-04 DIAGNOSIS — M25531 Pain in right wrist: Secondary | ICD-10-CM | POA: Diagnosis present

## 2016-09-04 DIAGNOSIS — Z79899 Other long term (current) drug therapy: Secondary | ICD-10-CM | POA: Insufficient documentation

## 2016-09-04 DIAGNOSIS — F17210 Nicotine dependence, cigarettes, uncomplicated: Secondary | ICD-10-CM | POA: Diagnosis not present

## 2016-09-04 DIAGNOSIS — M779 Enthesopathy, unspecified: Secondary | ICD-10-CM

## 2016-09-04 MED ORDER — PANTOPRAZOLE SODIUM 40 MG PO TBEC
40.0000 mg | DELAYED_RELEASE_TABLET | Freq: Once | ORAL | Status: AC
  Administered 2016-09-04: 40 mg via ORAL
  Filled 2016-09-04: qty 1

## 2016-09-04 MED ORDER — HYDROCODONE-ACETAMINOPHEN 5-325 MG PO TABS: 1.0000 | ORAL_TABLET | Freq: Four times a day (QID) | ORAL | 0 refills | Status: DC | PRN

## 2016-09-04 MED ORDER — NAPROXEN 250 MG PO TABS
500.0000 mg | ORAL_TABLET | Freq: Once | ORAL | Status: AC
  Administered 2016-09-04: 500 mg via ORAL
  Filled 2016-09-04: qty 2

## 2016-09-04 NOTE — ED Provider Notes (Addendum)
[facility] Provider Note: Georgena Spurling, MD, FACEP  CSN: [id number] MRN: [id number] ARRIVAL: 09/04/16 at [place]: [place]  Wrist Pain   HISTORY OF PRESENT ILLNESS  Jasmin Stewart is a 51 y.o. female Burt Knack. Food for cookout yesterday which required a lot of use of her hands. She has subsequently had the gradual onset of pain in her right wrist. The pain has become severe. The pain is located primarily over the flexor carpi ulnaris but radiates to her right fifth finger as well as up her forearm proximally. Pain is worse with palpation of the tendon or movement of the wrist. There is some mild associated swelling but no erythema or warmth. There is no associated numbness or functional deficit of the limited range of motion of the wrist due to pain. She denies trauma to the wrist.  Consultation with the [facility] state controlled substances database reveals the patient has received 2 prescriptions for hydrocodone cough syrup in the past year.   Past Medical History:  Diagnosis Date  . Bronchitis   . Hernia 2006   Suprapubic, Surgery 2007 with mesh  . History of chicken pox    Age 24  . Hypercholesteremia   . Hypoglycemia   . Hypokalemia   . Pneumonia    2013,2014  . PVC (premature ventricular contraction)   . Ulcer of esophagus     Past Surgical History:  Procedure Laterality Date  . CESAREAN SECTION    . CESAREAN SECTION    . CESAREAN SECTION    . HERNIA REPAIR  2007   Mesh surgery    Family History  Problem Relation Age of Onset  . Thyroid disease Mother   . Atrial fibrillation Mother   . Hyperlipidemia Mother   . Hyperlipidemia Sister   . Hyperlipidemia Brother   . Thyroid disease Maternal Grandmother   . Glaucoma Maternal Grandmother   . Cataracts Maternal Grandmother   . Esophageal cancer Maternal Grandfather   . Hypertension Maternal Uncle   . Obesity Maternal Uncle   . Allergies Son        Hay Fever  . Healthy  Daughter     Social History  Substance Use Topics  . Smoking status: Current Every Day Smoker    Packs/day: 0.25    Types: Cigarettes  . Smokeless tobacco: Never Used  . Alcohol use No    Prior to Admission medications   Medication Sig Start Date End Date Taking? Authorizing Provider  acetaminophen (TYLENOL) 500 MG tablet Take 500 mg by mouth every 6 (six) hours as needed.    [provider]  albuterol (PROVENTIL HFA;VENTOLIN HFA) 108 (90 Base) MCG/ACT inhaler Inhale 1-2 puffs into the lungs every 6 (six) hours as needed for wheezing or shortness of breath. PROVENTIL HFA    [provider]  beclomethasone (QVAR) 80 MCG/ACT inhaler Inhale 2 puffs into the lungs 2 (two) times daily. 05/29/16   Saguier, Percell Miller, PA-C  esomeprazole (NEXIUM) 20 MG capsule Take 1 capsule (20 mg total) by mouth daily as needed. 05/29/16   Saguier, Percell Miller, PA-C  HYDROcodone-acetaminophen (NORCO) 5-325 MG tablet Take 1 tablet by mouth every 6 (six) hours as needed (for wrist pain). 09/04/16   Laiyla Slagel, Jenny Reichmann, MD    Allergies Patient has no known allergies.   REVIEW OF SYSTEMS  Negative except as noted here or in the History of Present Illness.   PHYSICAL EXAMINATION  Initial Vital Signs Blood pressure (!) 143/93, pulse 65,  temperature 97.6 F (36.4 C), resp. rate 18, height 5\' 7"  (1.702 m), weight 110.2 kg (243 lb), SpO2 99 %.  Examination General: Well-developed, well-nourished female in no acute distress; appearance consistent with age of record HENT: normocephalic; atraumatic Eyes: Normal appearance Neck: supple Heart: regular rate and rhythm Lungs: clear to auscultation bilaterally Abdomen: soft; nondistended Extremities: No deformity; full range of motion except right wrist limited by pain; severe tenderness over right flexor carpi ulnaris at the wrist flexure with mild edema but no erythema or warmth, left hand is distally neurovascularly intact with intact tendon  function Neurologic: Awake, alert and oriented; motor function intact in all extremities and symmetric; no facial droop Skin: Warm and dry Psychiatric: Normal mood and affect   RESULTS  Summary of this visit's results, reviewed by myself:   EKG Interpretation  Date/Time:    Ventricular Rate:    PR Interval:    QRS Duration:   QT Interval:    QTC Calculation:   R Axis:     Text Interpretation:        Laboratory Studies: No results found for this or any previous visit (from the past 24 hour(s)). Imaging Studies: No results found.  ED COURSE  Nursing notes and initial vitals signs, including pulse oximetry, reviewed.  Vitals:   09/04/16 0627 09/04/16 0650  BP:  (!) 143/93  Pulse:  65  Resp:  18  Temp:  97.6 F (36.4 C)  SpO2:  99%  Weight: 110.2 kg (243 lb)   Height: 5\' 7"  (1.702 m)    Patient was advised to take Aleve or ibuprofen. She states her ability to tolerate NSAIDs is limited due to a history of peptic ulcer disease and she has been advised by her gastroenterologist to avoid NSAIDs. We will provide a dose of naproxen and Protonix in the ED. We will treat her with a short course of narcotics and refer her to sports medicine should symptoms persist.  PROCEDURES    ED DIAGNOSES     ICD-9-CM ICD-10-CM   1. Tendinitis of flexor tendon of right hand 727.05 M77.8        Navpreet Szczygiel, MD 09/04/16 5809    Shanon Rosser, MD 09/04/16 9833    Shanon Rosser, MD 09/04/16 [phone]

## 2016-09-04 NOTE — ED Triage Notes (Addendum)
C/o right wrist pain that started yesterday. Denies any injury. States she felt a pop.  States this happened after grilling yesterday. Denies any numbness or tingling. Describes as a shooting type pain. No obvious deformity. Has not taken any medications for pain. Pain with palpation to wrist area.

## 2016-09-13 ENCOUNTER — Encounter: Payer: Self-pay | Admitting: Medical

## 2016-09-13 ENCOUNTER — Encounter: Payer: Self-pay | Admitting: Physician Assistant

## 2016-09-14 ENCOUNTER — Other Ambulatory Visit: Payer: Self-pay

## 2016-09-14 ENCOUNTER — Encounter: Payer: Self-pay | Admitting: Gynecology

## 2016-09-19 DIAGNOSIS — M9903 Segmental and somatic dysfunction of lumbar region: Secondary | ICD-10-CM | POA: Diagnosis not present

## 2016-09-19 DIAGNOSIS — M9904 Segmental and somatic dysfunction of sacral region: Secondary | ICD-10-CM | POA: Diagnosis not present

## 2016-09-19 DIAGNOSIS — M545 Low back pain: Secondary | ICD-10-CM | POA: Diagnosis not present

## 2016-09-19 DIAGNOSIS — M546 Pain in thoracic spine: Secondary | ICD-10-CM | POA: Diagnosis not present

## 2016-09-20 ENCOUNTER — Ambulatory Visit (INDEPENDENT_AMBULATORY_CARE_PROVIDER_SITE_OTHER): Payer: BLUE CROSS/BLUE SHIELD | Admitting: Physician Assistant

## 2016-09-20 ENCOUNTER — Encounter: Payer: Self-pay | Admitting: Physician Assistant

## 2016-09-20 VITALS — BP 128/80 | HR 73 | Ht 66.0 in | Wt 243.0 lb

## 2016-09-20 DIAGNOSIS — R1013 Epigastric pain: Secondary | ICD-10-CM

## 2016-09-20 DIAGNOSIS — Z8719 Personal history of other diseases of the digestive system: Secondary | ICD-10-CM

## 2016-09-20 DIAGNOSIS — R110 Nausea: Secondary | ICD-10-CM

## 2016-09-20 DIAGNOSIS — Z8711 Personal history of peptic ulcer disease: Secondary | ICD-10-CM

## 2016-09-20 DIAGNOSIS — R142 Eructation: Secondary | ICD-10-CM

## 2016-09-20 MED ORDER — ESOMEPRAZOLE MAGNESIUM 40 MG PO CPDR: 40.0000 mg | DELAYED_RELEASE_CAPSULE | Freq: Every day | ORAL | 5 refills | Status: DC

## 2016-09-20 NOTE — Patient Instructions (Signed)
If you are age 49 or older, your body mass index should be between 23-30. Your Body mass index is 39.22 kg/m. If this is out of the aforementioned range listed, please consider follow up with your Primary Care Provider.  If you are age 53 or younger, your body mass index should be between 19-25. Your Body mass index is 39.22 kg/m. If this is out of the aformentioned range listed, please consider follow up with your Primary Care Provider.   You have been scheduled for an endoscopy. Please follow written instructions given to you at your visit today. If you use inhalers (even only as needed), please bring them with you on the day of your procedure. Your physician has requested that you go to www.startemmi.com and enter the access code given to you at your visit today. This web site gives a general overview about your procedure. However, you should still follow specific instructions given to you by our office regarding your preparation for the procedure.  We have sent the following medications to your pharmacy for you to pick up at your convenience: Esomeprazole 40 mg  You have been given GERD literature.  Thank you for choosing me and [facility].   Ellouise Newer, PA-C

## 2016-09-20 NOTE — Progress Notes (Addendum)
Chief Complaint: Epigastric abdominal pain, nausea, gas  HPI:  Jasmin Stewart is a 51 year old [place] female with a past medical history of PUD, and others also below, who was referred to me by Mackie Pai, PA-C for a complaint of epigastric abdominal pain .   Today, the patient begins by explaining that she was diagnosed with an ulcer in 2006 via EGD while in [place]. We do not have report of this and the patient is unsure what doctor performed this. She does tell me that ever since that time she has been Espmeprazole 20 mg when necessary whenever she feels "that pain at the base of my esophagus". Patient tells me that she is only on this periodically over the past few years. Most recently, the patient has noticed an increase in her epigastric pain which she tells me is slightly different than "my ulcer pain", noting that it is a circular area above her bellybutton but "below my ribs", this pain is described as "piercing/stabbing" on multiple occasions. She recounts 2 episodes within the past 2 weeks after eating quite a large meal. Patient tells me that about 20 minutes after eating she develops this piercing pain and rolls into the fetal position and it will go away within 15-20 minutes. The last time this occurred she also tried taking a Tums which seem to help. Patient tells me she had not thought to take her Esomeprazole OTC as this was slightly different than her "ulcer pain". Patient does tell me that after her initial diagnosis of ulcer she has decreased her intake of sodas and hot sauce and stopped use of NSAIDs. Patient does tell me that it seems that this initial irritation got worse when she was on a lot of antibiotics as well as prednisone for multiple URI symptoms in February through March of this year. Patient tells me Prednisone also made her gain around 20 pounds. Associated symptoms include a feeling of nausea and an increase in eructations over the past few months.   Patient  denies fever, chills, blood in her stool, melena, weight loss, fatigue, anorexia, change in diet, vomiting, heartburn, reflux, dysphagia or symptoms that awaken her at night.  Past Medical History:  Diagnosis Date  . Bronchitis   . Hernia 2006   Suprapubic, Surgery 2007 with mesh  . History of chicken pox    Age 32  . Hypercholesteremia   . Hypoglycemia   . Hypokalemia   . Pneumonia    2013,2014  . PVC (premature ventricular contraction)   . Ulcer of esophagus     Past Surgical History:  Procedure Laterality Date  . CESAREAN SECTION    . CESAREAN SECTION    . CESAREAN SECTION    . HERNIA REPAIR  2007   Mesh surgery    Current Outpatient Prescriptions  Medication Sig Dispense Refill  . esomeprazole (NEXIUM) 40 MG capsule Take 1 capsule (40 mg total) by mouth daily at 12 noon. 30 capsule 5   No current facility-administered medications for this visit.     Allergies as of 09/20/2016  . (No Known Allergies)    Family History  Problem Relation Age of Onset  . Thyroid disease Mother   . Atrial fibrillation Mother   . Hyperlipidemia Mother   . Hyperlipidemia Sister   . Hyperlipidemia Brother   . Thyroid disease Maternal Grandmother   . Glaucoma Maternal Grandmother   . Cataracts Maternal Grandmother   . Esophageal cancer Maternal Grandfather   . Hypertension Maternal  Uncle   . Obesity Maternal Uncle   . Allergies Son        Hay Fever  . Healthy Daughter     Social History   Social History  . Marital status: Single    Spouse name: N/A  . Number of children: N/A  . Years of education: N/A   Occupational History  . Not on file.   Social History Main Topics  . Smoking status: Current Every Day Smoker    Packs/day: 0.25    Types: Cigarettes  . Smokeless tobacco: Never Used  . Alcohol use No  . Drug use: No  . Sexual activity: Yes    Birth control/ protection: , None     Comment: 1st intercourse 51 yo-More than 5 partners   Other Topics Concern  . Not  on file   Social History Narrative  . No narrative on file    Review of Systems:    Constitutional: No weight loss, fever or chills Skin: No rash  Cardiovascular: No chest pain Respiratory: No SOB  Gastrointestinal: See HPI and otherwise negative Genitourinary: No dysuria  Neurological: No headache Musculoskeletal: No new muscle or joint pain Hematologic: No bleeding Psychiatric: No history of depression or anxiety   Physical Exam:  Vital signs: BP 128/80   Pulse 73   Ht 5\' 6"  (1.676 m)   Wt 243 lb (110.2 kg)   SpO2 94%   BMI 39.22 kg/m   Constitutional:   Pleasant Obese Caucasian female appears to be in NAD, Well developed, Well nourished, alert and cooperative Head:  Normocephalic and atraumatic. Eyes:   PEERL, EOMI. No icterus. Conjunctiva pink. Ears:  Normal auditory acuity. Neck:  Supple Throat: Oral cavity and pharynx without inflammation, swelling or lesion.  Respiratory: Respirations even and unlabored. Lungs clear to auscultation bilaterally.   No wheezes, crackles, or rhonchi.  Cardiovascular: Normal S1, S2. No MRG. Regular rate and rhythm. No peripheral edema, cyanosis or pallor.  Gastrointestinal:  Soft, nondistended, nontender. No rebound or guarding. Normal bowel sounds. No appreciable masses or hepatomegaly. Rectal:  Not performed.  Msk:  Symmetrical without gross deformities. Without edema, no deformity or joint abnormality.  Neurologic:  Alert and  oriented x4;  grossly normal neurologically.  Skin:   Dry and intact without significant lesions or rashes. Psychiatric:  Demonstrates good judgement and reason without abnormal affect or behaviors.  MOST RECENT LABS AND IMAGING: CBC    Component Value Date/Time   WBC 12.6 (H) 05/29/2016 0945   RBC 4.43 05/29/2016 0945   HGB 12.8 05/29/2016 0945   HCT 39.1 05/29/2016 0945   PLT 448.0 (H) 05/29/2016 0945   MCV 88.2 05/29/2016 0945   MCH 29.3 07/16/2015 0840   MCHC 32.9 05/29/2016 0945   RDW 15.7 (H)  05/29/2016 0945   LYMPHSABS 4.3 (H) 05/29/2016 0945   MONOABS 0.6 05/29/2016 0945   EOSABS 0.3 05/29/2016 0945   BASOSABS 0.1 05/29/2016 0945    CMP     Component Value Date/Time   NA 141 05/11/2016 0905   K 3.4 (L) 05/11/2016 0905   CL 106 05/11/2016 0905   CO2 32 05/11/2016 0905   GLUCOSE 91 05/11/2016 0905   BUN 17 05/11/2016 0905   CREATININE 0.64 05/11/2016 0905   CALCIUM 8.8 05/11/2016 0905   PROT 6.5 05/11/2016 0905   ALBUMIN 3.8 05/11/2016 0905   AST 11 05/11/2016 0905   ALT 16 05/11/2016 0905   ALKPHOS 59 05/11/2016 0905   BILITOT 0.4 05/11/2016 0905  GFRNONAA >60 07/16/2015 0840   GFRAA >60 07/16/2015 0840    Assessment: 1. Epigastric pain: Increased episodes over the past 3 months, patient believes this started after using multiple antibiotics and Prednisone for an upper respiratory tract infection, also weight gain of19 pounds likely contributing, patient only using over-the-counter Nexium when necessary at the moment; consider PUD versus gastritis versus esophagitis versus H. pylori versus other 2. History of PUD: in [place] in 2006 3. Nausea: With above  Plan: 1. Patient was scheduled for an EGD in the [facility] with Dr. Hilarie Fredrickson, as he is supervising physician today. We did discuss risks, benefits, limitations and alternatives the patient agrees to proceed. 2. Prescribe Esomeprazole 40 mg daily, 30-60 minutes before breakfast. We did discuss that the patient should be taking this medication every day. 3. We discussed antireflux diet and lifestyle modifications. 4. Encouraged the patient to continue her diet modifications. 5. Recommend the patient avoid NSAIDs 6. Patient to return to clinic per recommendations from Dr. Hilarie Fredrickson after time of procedure.  Ellouise Newer, PA-C [facility] 09/20/2016, 3:31 PM  Cc: Mackie Pai, PA-C   Addendum: Reviewed and agree with initial management. Pyrtle, Lajuan Lines, MD

## 2016-09-21 DIAGNOSIS — M546 Pain in thoracic spine: Secondary | ICD-10-CM | POA: Diagnosis not present

## 2016-09-21 DIAGNOSIS — M9904 Segmental and somatic dysfunction of sacral region: Secondary | ICD-10-CM | POA: Diagnosis not present

## 2016-09-21 DIAGNOSIS — M9903 Segmental and somatic dysfunction of lumbar region: Secondary | ICD-10-CM | POA: Diagnosis not present

## 2016-09-21 DIAGNOSIS — M545 Low back pain: Secondary | ICD-10-CM | POA: Diagnosis not present

## 2016-09-25 ENCOUNTER — Telehealth: Payer: Self-pay

## 2016-09-25 NOTE — Telephone Encounter (Signed)
Received a prior authorization from pharmacy for [employer]; insurance does not cover.  Contacted patient to make her aware and she states that she buys it over-the-counter and will continue to do so.  Prior authorization not done.

## 2016-09-26 DIAGNOSIS — M546 Pain in thoracic spine: Secondary | ICD-10-CM | POA: Diagnosis not present

## 2016-09-26 DIAGNOSIS — M9903 Segmental and somatic dysfunction of lumbar region: Secondary | ICD-10-CM | POA: Diagnosis not present

## 2016-09-26 DIAGNOSIS — M545 Low back pain: Secondary | ICD-10-CM | POA: Diagnosis not present

## 2016-09-26 DIAGNOSIS — M9904 Segmental and somatic dysfunction of sacral region: Secondary | ICD-10-CM | POA: Diagnosis not present

## 2016-10-20 ENCOUNTER — Telehealth: Payer: Self-pay

## 2016-10-20 NOTE — Telephone Encounter (Signed)
Left message on pts voicemail regarding time of EGD.  EGD is scheduled for 11/02/16 at 330 pm NOT 300 pm as instructions state.  Pt advised to arrive for procedure at 230 pm and drink liquids until 1230 pm not 12 noon.  Requested that pt return my call to confirm receipt of this message.

## 2016-10-23 NOTE — Telephone Encounter (Signed)
Patient calling back states she will come in for EGD on 11/02/16 at 2:30pm.

## 2016-11-02 ENCOUNTER — Ambulatory Visit (AMBULATORY_SURGERY_CENTER): Payer: BLUE CROSS/BLUE SHIELD | Admitting: Internal Medicine

## 2016-11-02 ENCOUNTER — Encounter: Payer: Self-pay | Admitting: Internal Medicine

## 2016-11-02 VITALS — BP 144/81 | HR 69 | Temp 98.0°F | Resp 16 | Ht 66.0 in | Wt 243.0 lb

## 2016-11-02 DIAGNOSIS — K2980 Duodenitis without bleeding: Secondary | ICD-10-CM

## 2016-11-02 DIAGNOSIS — K2950 Unspecified chronic gastritis without bleeding: Secondary | ICD-10-CM | POA: Diagnosis not present

## 2016-11-02 DIAGNOSIS — K219 Gastro-esophageal reflux disease without esophagitis: Secondary | ICD-10-CM | POA: Diagnosis not present

## 2016-11-02 DIAGNOSIS — R1013 Epigastric pain: Secondary | ICD-10-CM

## 2016-11-02 MED ORDER — SODIUM CHLORIDE 0.9 % IV SOLN: 500.0000 mL | INTRAVENOUS | Status: AC

## 2016-11-02 NOTE — Progress Notes (Signed)
Patient ambulated around nursing station times two. Moderate amount of air expelled. Assist to bathroom again more air expelled. Stated I feel so much better. Instructed to call if needed.

## 2016-11-02 NOTE — Progress Notes (Signed)
Report to PACU, RN, vss, BBS= Clear.  

## 2016-11-02 NOTE — Patient Instructions (Addendum)
Discharge instructions given. Biopsies taken. Resume previous medications. Avoid aspirin,ibuprofen,naproxen, or other non-steroidal anti-inflammatory drugs. YOU HAD AN ENDOSCOPIC PROCEDURE TODAY AT [facility]:   Refer to the procedure report that was given to you for any specific questions about what was found during the examination.  If the procedure report does not answer your questions, please call your gastroenterologist to clarify.  If you requested that your care partner not be given the details of your procedure findings, then the procedure report has been included in a sealed envelope for you to review at your convenience later.  YOU SHOULD EXPECT: Some feelings of bloating in the abdomen. Passage of more gas than usual.  Walking can help get rid of the air that was put into your GI tract during the procedure and reduce the bloating. If you had a lower endoscopy (such as a colonoscopy or flexible sigmoidoscopy) you may notice spotting of blood in your stool or on the toilet paper. If you underwent a bowel prep for your procedure, you may not have a normal bowel movement for a few days.  Please Note:  You might notice some irritation and congestion in your nose or some drainage.  This is from the oxygen used during your procedure.  There is no need for concern and it should clear up in a day or so.  SYMPTOMS TO REPORT IMMEDIATELY:   Following upper endoscopy (EGD)  Vomiting of blood or coffee ground material  New chest pain or pain under the shoulder blades  Painful or persistently difficult swallowing  New shortness of breath  Fever of 100F or higher  Black, tarry-looking stools  For urgent or emergent issues, a gastroenterologist can be reached at any hour by calling [phone].   DIET:  We do recommend a small meal at first, but then you may proceed to your regular diet.  Drink plenty of fluids but you should avoid alcoholic beverages for 24  hours.  ACTIVITY:  You should plan to take it easy for the rest of today and you should NOT DRIVE or use heavy machinery until tomorrow (because of the sedation medicines used during the test).    FOLLOW UP: Our staff will call the number listed on your records the next business day following your procedure to check on you and address any questions or concerns that you may have regarding the information given to you following your procedure. If we do not reach you, we will leave a message.  However, if you are feeling well and you are not experiencing any problems, there is no need to return our call.  We will assume that you have returned to your regular daily activities without incident.  If any biopsies were taken you will be contacted by phone or by letter within the next 1-3 weeks.  Please call us at [phone] if you have not heard about the biopsies in 3 weeks.    SIGNATURES/CONFIDENTIALITY: You and/or your care partner have signed paperwork which will be entered into your electronic medical record.  These signatures attest to the fact that that the information above on your After Visit Summary has been reviewed and is understood.  Full responsibility of the confidentiality of this discharge information lies with you and/or your care-partner.

## 2016-11-02 NOTE — Op Note (Signed)
[facility] Patient Name: Jasmin Stewart Procedure Date: 11/02/2016 2:51 PM MRN: [id number] Endoscopist: Jerene Bears , MD Age: 51 Referring MD:  Date of Birth: [birth year] Gender: Female Account #: [id number] Procedure:                Upper GI endoscopy Indications:              Epigastric abdominal pain Medicines:                Monitored Anesthesia Care Procedure:                Pre-Anesthesia Assessment:                           - Prior to the procedure, a History and Physical                            was performed, and patient medications and                            allergies were reviewed. The patient's tolerance of                            previous anesthesia was also reviewed. The risks                            and benefits of the procedure and the sedation                            options and risks were discussed with the patient.                            All questions were answered, and informed consent                            was obtained. Prior Anticoagulants: The patient has                            taken no previous anticoagulant or antiplatelet                            agents. ASA Grade Assessment: II - A patient with                            mild systemic disease. After reviewing the risks                            and benefits, the patient was deemed in                            satisfactory condition to undergo the procedure.                           After obtaining informed consent, the endoscope was  passed under direct vision. Throughout the                            procedure, the patient's blood pressure, pulse, and                            oxygen saturations were monitored continuously. The                            Endoscope was introduced through the mouth, and                            advanced to the second part of duodenum. The upper                            GI endoscopy was accomplished  without difficulty.                            The patient tolerated the procedure well. Scope In: Scope Out: Findings:                 The examined esophagus was normal.                           The entire examined stomach was normal. Biopsies                            were taken with a cold forceps for histology and                            Helicobacter pylori testing.                           Moderate inflammation characterized by congestion                            (edema) and erythema was found in the duodenal                            bulb. Multiple biopsies were obtained with cold                            forceps for histology.                           The second portion of the duodenum was normal. Complications:            No immediate complications. Estimated Blood Loss:     Estimated blood loss was minimal. Impression:               - Normal esophagus.                           - Normal stomach. Biopsied.                           -  Duodenitis. Biopsied.                           - Normal second portion of the duodenum. Recommendation:           - Patient has a contact number available for                            emergencies. The signs and symptoms of potential                            delayed complications were discussed with the                            patient. Return to normal activities tomorrow.                            Written discharge instructions were provided to the                            patient.                           - Resume previous diet.                           - Continue present medications.                           - Avoid aspirin, ibuprofen, naproxen, or other                            non-steroidal anti-inflammatory drugs.                           - Await pathology results.                           - If biopsies results unremarkable, I recommend                            abdominal US to exclude gallstones given episodic                             upper abdominal pain. Jerene Bears, MD 11/02/2016 3:28:23 PM This report has been signed electronically.

## 2016-11-02 NOTE — Progress Notes (Signed)
Called to room to assist during endoscopic procedure.  Patient ID and intended procedure confirmed with present staff. Received instructions for my participation in the procedure from the performing physician.  

## 2016-11-03 ENCOUNTER — Telehealth: Payer: Self-pay | Admitting: *Deleted

## 2016-11-03 NOTE — Telephone Encounter (Signed)
  Follow up Call-  Call back number 11/02/2016  Post procedure Call Back phone  # [phone] Ext [phone]  Permission to leave phone message Yes  Some recent data might be hidden     Patient questions:  Do you have a fever, pain , or abdominal swelling? No. Pain Score  0 *  Have you tolerated food without any problems? Yes.    Have you been able to return to your normal activities? Yes.    Do you have any questions about your discharge instructions: Diet   No. Medications  No. Follow up visit  No.  Do you have questions or concerns about your Care? No.  Actions: * If pain score is 4 or above: No action needed, pain <4.

## 2016-11-05 ENCOUNTER — Encounter: Payer: Self-pay | Admitting: Medical

## 2016-11-06 ENCOUNTER — Encounter: Payer: Self-pay | Admitting: Medical

## 2016-11-07 ENCOUNTER — Telehealth: Payer: Self-pay | Admitting: *Deleted

## 2016-11-07 DIAGNOSIS — R109 Unspecified abdominal pain: Secondary | ICD-10-CM

## 2016-11-07 NOTE — Telephone Encounter (Signed)
I have spoken to patient to advise of peptic inflammation related to acid and/or NSAID usage. Advised to avoid NSAID's and continue Nexium. Also advised that biopsies negative for H Pylori. Patient agreeable to having abdominal ultrasound to rule out gallstones. She is scheduled at [facility] radiology on Tuesday, 11/14/16 @ 11:30 am. I have spoken to patient to advise of this and she verbalizes understanding.

## 2016-11-07 NOTE — Telephone Encounter (Signed)
-----   Message from Jerene Bears, MD sent at 11/07/2016 12:00 PM EDT ----- Please let patient know that biopsies did not show H. pylori infection in the stomach. It did show peptic inflammation in the small bowel related to acid and possibly NSAIDs. She should avoid NSAIDs. Continue Nexium Proceed to abdominal ultrasound to evaluate abdominal pain, rule out gallstones Phone call in lieu of pathology letter

## 2016-11-14 ENCOUNTER — Ambulatory Visit (HOSPITAL_COMMUNITY)
Admission: RE | Admit: 2016-11-14 | Discharge: 2016-11-14 | Disposition: A | Discharge status: Home / Self Care | Payer: BLUE CROSS/BLUE SHIELD | Source: Ambulatory Visit | Attending: Internal Medicine | Admitting: Internal Medicine

## 2016-11-14 DIAGNOSIS — R1011 Right upper quadrant pain: Secondary | ICD-10-CM | POA: Diagnosis not present

## 2016-11-14 DIAGNOSIS — R109 Unspecified abdominal pain: Secondary | ICD-10-CM | POA: Diagnosis not present

## 2016-11-17 ENCOUNTER — Encounter: Payer: Self-pay | Admitting: Medical

## 2016-12-13 ENCOUNTER — Encounter: Payer: Self-pay | Admitting: Gynecology

## 2016-12-13 DIAGNOSIS — Z1231 Encounter for screening mammogram for malignant neoplasm of breast: Secondary | ICD-10-CM | POA: Diagnosis not present

## 2016-12-25 ENCOUNTER — Encounter: Payer: Self-pay | Admitting: Medical

## 2016-12-25 ENCOUNTER — Ambulatory Visit (INDEPENDENT_AMBULATORY_CARE_PROVIDER_SITE_OTHER): Payer: BLUE CROSS/BLUE SHIELD | Admitting: Medical

## 2016-12-25 VITALS — BP 120/73 | HR 74 | Temp 98.4°F | Resp 16 | Ht 66.0 in | Wt 242.8 lb

## 2016-12-25 DIAGNOSIS — J3489 Other specified disorders of nose and nasal sinuses: Secondary | ICD-10-CM

## 2016-12-25 DIAGNOSIS — J029 Acute pharyngitis, unspecified: Secondary | ICD-10-CM

## 2016-12-25 LAB — POCT RAPID STREP A (OFFICE): Rapid Strep A Screen: NEGATIVE

## 2016-12-25 MED ORDER — FLUTICASONE PROPIONATE 50 MCG/ACT NA SUSP: 2.0000 | Freq: Every day | NASAL | 1 refills | Status: DC

## 2016-12-25 MED ORDER — BENZONATATE 100 MG PO CAPS: 100.0000 mg | ORAL_CAPSULE | Freq: Three times a day (TID) | ORAL | 0 refills | Status: DC | PRN

## 2016-12-25 MED ORDER — AMOXICILLIN-POT CLAVULANATE 875-125 MG PO TABS: 1.0000 | ORAL_TABLET | Freq: Two times a day (BID) | ORAL | 0 refills | Status: DC

## 2016-12-25 NOTE — Progress Notes (Signed)
Subjective:    Patient ID: Jasmin Stewart, female    DOB: [birth year], 51 y.o.   MRN: [id number]  HPI  Pt in for recent st that started today. Hurts a lot to swallow. Mild faint achiness in shoulders. Some mild pnd, mild cough and faint nasal congestion.  Hurts to swallow even though on tylenol.    Review of Systems  Constitutional: Negative for chills, fatigue and fever.  HENT: Positive for postnasal drip and sore throat. Negative for drooling, sinus pain and sinus pressure.        Maybe mild early nasal congestion.  Respiratory: Positive for cough. Negative for chest tightness, shortness of breath and wheezing.        Some productive cough.  Cardiovascular: Negative for chest pain and palpitations.  Gastrointestinal: Negative for abdominal pain, anal bleeding, constipation, nausea and vomiting.  Musculoskeletal: Positive for myalgias. Negative for back pain, joint swelling and neck pain.       Mild achiness in shoulders.  Skin: Negative for rash.  Neurological: Negative for dizziness, seizures, speech difficulty, weakness, light-headedness and headaches.  Hematological: Negative for adenopathy. Does not bruise/bleed easily.  Psychiatric/Behavioral: Negative for confusion.    Past Medical History:  Diagnosis Date  . Bronchitis   . Hernia 2006   Suprapubic, Surgery 2007 with mesh  . History of chicken pox    Age 60  . Hypercholesteremia   . Hypoglycemia   . Hypokalemia   . Pneumonia    2013,2014  . PVC (premature ventricular contraction)   . Ulcer of esophagus      Social History   Social History  . Marital status: Single    Spouse name: N/A  . Number of children: N/A  . Years of education: N/A   Occupational History  . Not on file.   Social History Main Topics  . Smoking status: Current Every Day Smoker    Packs/day: 0.25    Types: Cigarettes  . Smokeless tobacco: Never Used  . Alcohol use No  . Drug use: No  . Sexual activity: Yes    Birth control/  protection: , None     Comment: 1st intercourse 51 yo-More than 5 partners   Other Topics Concern  . Not on file   Social History Narrative  . No narrative on file    Past Surgical History:  Procedure Laterality Date  . CESAREAN SECTION    . CESAREAN SECTION    . CESAREAN SECTION    . HERNIA REPAIR  2007   Mesh surgery    Family History  Problem Relation Age of Onset  . Thyroid disease Mother   . Atrial fibrillation Mother   . Hyperlipidemia Mother   . Hyperlipidemia Sister   . Hyperlipidemia Brother   . Thyroid disease Maternal Grandmother   . Glaucoma Maternal Grandmother   . Cataracts Maternal Grandmother   . Esophageal cancer Maternal Grandfather   . Hypertension Maternal Uncle   . Obesity Maternal Uncle   . Allergies Son        Hay Fever  . Healthy Daughter     No Known Allergies  Current Outpatient Prescriptions on File Prior to Visit  Medication Sig Dispense Refill  . esomeprazole (NEXIUM) 40 MG capsule Take 1 capsule (40 mg total) by mouth daily at 12 noon. 30 capsule 5   Current Facility-Administered Medications on File Prior to Visit  Medication Dose Route Frequency Provider Last Rate Last Dose  . 0.9 %  sodium chloride infusion  500 mL Intravenous Continuous Pyrtle, Carie Caddy, MD        BP 120/73   Pulse 74   Temp 98.4 F (36.9 C) (Oral)   Resp 16   Ht 5\' 6"  (1.676 m)   Wt 242 lb 12.8 oz (110.1 kg)   SpO2 99%   BMI 39.19 kg/m       Objective:   Physical Exam  General  Mental Status - Alert. General Appearance - Well groomed. Not in acute distress. But when swallows looks to be in pain.  Skin Rashes- No Rashes.  HEENT Head- Normal. Ear Auditory Canal - Left- Normal. Right - Normal.Tympanic Membrane- Left- Normal. Right- Normal. Eye Sclera/Conjunctiva- Left- Normal. Right- Normal. Nose & Sinuses Nasal Mucosa- Left-  Boggy and Congested. Right-  Boggy and  Congested.Bilateral faint  Maxillary tendernes but no  frontal sinus  pressure. Mouth & Throat Lips: Upper Lip- Normal: no dryness, cracking, pallor, cyanosis, or vesicular eruption. Lower Lip-Normal: no dryness, cracking, pallor, cyanosis or vesicular eruption. Buccal Mucosa- Bilateral- No Aphthous ulcers. Oropharynx- No Discharge or Erythema. Tonsils: Characteristics- Bilateral-  Moderate bright Erythema and Congestion. Size/Enlargement- Bilateral- 1+ enlargement. Discharge- bilateral-None.  Neck Neck- Supple. No Masses. Mild enlarged submandibular nodes swollen and tender.   Chest and Lung Exam Auscultation: Breath Sounds:-Clear even and unlabored.  Cardiovascular Auscultation:Rythm- Regular, rate and rhythm. Murmurs & Other Heart Sounds:Ausculatation of the heart reveal- No Murmurs.  Lymphatic Head & Neck General Head & Neck Lymphatics: Bilateral: Description- see neck exam. Enlarged lymph node mild swollen.       Assessment & Plan:  Your strep test was negative. However, your physical exam and clinical presentation is suspicious for strep and it is important to note that rapid strep test can be falsely negative. So I am going to give you augmentin antibiotic today based on your exam and clinical presentation.  Augmentin may help sinus pressure/early sinus infection as well.  For any nasal congestion can start flonase nasal spray. For cough rx benzonatate  Rest hydrate, tylenol for fever, and warm salt water gargles.   Follow up in 7 days or as needed.   Rahim Astorga, Ramon Dredge, PA-C

## 2016-12-25 NOTE — Patient Instructions (Addendum)
Your strep test was negative. However, your physical exam and clinical presentation is suspicious for strep and it is important to note that rapid strep test can be falsely negative. So I am going to give you augmentin antibiotic today based on your exam and clinical presentation.  Augmentin may help sinus pressure/early sinus infection as well.  For any nasal congestion can start flonase nasal spray. For cough rx benzonatate  Rest hydrate, tylenol for fever, and warm salt water gargles.   Follow up in 7 days or as needed.

## 2016-12-27 LAB — CULTURE, GROUP A STREP
MICRO NUMBER: 81023951
SPECIMEN QUALITY:: ADEQUATE

## 2017-01-08 HISTORY — PX: ESOPHAGOGASTRODUODENOSCOPY: SHX1529

## 2017-01-18 ENCOUNTER — Encounter: Payer: Self-pay | Admitting: Medical

## 2017-01-21 ENCOUNTER — Telehealth: Payer: Self-pay | Admitting: Medical

## 2017-01-21 DIAGNOSIS — K439 Ventral hernia without obstruction or gangrene: Secondary | ICD-10-CM

## 2017-01-21 DIAGNOSIS — K469 Unspecified abdominal hernia without obstruction or gangrene: Secondary | ICD-10-CM

## 2017-01-21 NOTE — Telephone Encounter (Signed)
Referral to surgeon placed. 

## 2017-02-12 ENCOUNTER — Ambulatory Visit: Payer: Self-pay | Admitting: Surgery

## 2017-02-12 DIAGNOSIS — K432 Incisional hernia without obstruction or gangrene: Secondary | ICD-10-CM | POA: Diagnosis not present

## 2017-02-12 NOTE — H&P (Signed)
History of Present Illness Jasmin Stewart. Aylanie Cubillos MD; 02/12/2017 9:14 PM) The patient is a 51 year old female who presents with an incisional hernia. Referred by Mackie Pai, PA-C for ventral hernia  This is a 51 yo female who is a smoker (1/2 ppd) who presents after several C-sections. She had developed a hernia in her lower midline when she lived in [place]. In 2007, she underwent open mesh repair through a Pfannenstiel incision. No records available. Shortly after that time, she was packing her apartment to move to [facility]. She admits that she may have lifted too much. She developed a visible bulge in her lower midline, but has not sought any surgical attention for this in the last 10.5 years. Earlier this year, she had issues with a URI and severe cough. The hernia protruded more and extended to the right. She finally comes in today for surgical evaluation. She denies any change in her bowel habits. The hernia is quite uncomfortable. She works two jobs and one of these requires lifting and extended periods of time spent standing. No imaging.   Past Surgical History (Janette Ranson, CMA; 02/12/2017 11:36 AM) Cesarean Section - Multiple  Diagnostic Studies History (Janette Ranson, CMA; 02/12/2017 11:36 AM) Colonoscopy never Mammogram within last year Pap Smear 1-5 years ago  Allergies (Janette Ranson, CMA; 02/12/2017 11:23 AM) No Known Drug Allergies 02/12/2017  Medication History (Janette Ranson, CMA; 02/12/2017 11:23 AM) NexIUM (20MG  Capsule DR, Oral) Active.  Social History (Janette Ranson, CMA; 02/12/2017 11:36 AM) Caffeine use Coffee. No alcohol use No drug use Tobacco use Current every day smoker.  Family History (Janette Ranson, CMA; 02/12/2017 11:36 AM) Migraine Headache Daughter, Mother. Thyroid problems Mother.   Pregnancy / Birth History (Janette Ranson, CMA; 02/12/2017 11:36 AM)  Age at menarche 51 years.  Contraceptive History Oral  contraceptives.  Gravida 4  Irregular periods  Maternal age 16-20  Para 4    Other Problems (Janette Ranson, CMA; 02/12/2017 11:36 AM)  Asthma  Gastric Ulcer  Inguinal Hernia          Review of Systems (Janette Ranson CMA; 02/12/2017 11:36 AM)  General Not Present- Appetite Loss, Chills, Fatigue, Fever, Night Sweats, Weight Gain and Weight Loss.  Skin Not Present- Change in Wart/Mole, Dryness, Hives, Jaundice, New Lesions, Non-Healing Wounds, Rash and Ulcer.  HEENT Present- Wears glasses/contact lenses. Not Present- Earache, Hearing Loss, Hoarseness, Nose Bleed, Oral Ulcers, Ringing in the Ears, Seasonal Allergies, Sinus Pain, Sore Throat, Visual Disturbances and Yellow Eyes.  Respiratory Not Present- Bloody sputum, Chronic Cough, Difficulty Breathing, Snoring and Wheezing.  Breast Not Present- Breast Mass, Breast Pain, Nipple Discharge and Skin Changes.  Cardiovascular Not Present- Chest Pain, Difficulty Breathing Lying Down, Leg Cramps, Palpitations, Rapid Heart Rate, Shortness of Breath and Swelling of Extremities.  Gastrointestinal Not Present- Abdominal Pain, Bloating, Bloody Stool, Change in Bowel Habits, Chronic diarrhea, Constipation, Difficulty Swallowing, Excessive gas, Gets full quickly at meals, Hemorrhoids, Indigestion, Nausea, Rectal Pain and Vomiting.  Female Genitourinary Not Present- Frequency, Nocturia, Painful Urination, Pelvic Pain and Urgency.  Musculoskeletal Not Present- Back Pain, Joint Pain, Joint Stiffness, Muscle Pain, Muscle Weakness and Swelling of Extremities.  Neurological Not Present- Decreased Memory, Fainting, Headaches, Numbness, Seizures, Tingling, Tremor, Trouble walking and Weakness.  Psychiatric Not Present- Anxiety, Bipolar, Change in Sleep Pattern, Depression, Fearful and Frequent crying.  Endocrine Not Present- Cold Intolerance, Excessive Hunger, Hair Changes, Heat Intolerance, Hot flashes and New Diabetes.  Hematology Not Present-  Blood Thinners, Easy Bruising, Excessive bleeding, Gland problems,  HIV and Persistent Infections.    Vitals (Janette Ranson CMA; 02/12/2017 11:24 AM)  02/12/2017 11:23 AM  Weight: 243 lb Height: 66.5in  Body Surface Area: 2.18 m Body Mass Index: 38.63 kg/m   Pulse: 75 (Regular)   BP: 120/78 (Sitting, Left Arm, Standard)            Physical Exam Rodman Key K. Tricia Oaxaca MD; 02/12/2017 9:15 PM)    The physical exam findings are as follows:  Note:WDWN in NAD  Eyes: Pupils equal, round; sclera anicteric  HENT: Oral mucosa moist; good dentition  Neck: No masses palpated, no thyromegaly  Lungs: CTA bilaterally; normal respiratory effort  CV: Regular rate and rhythm; no murmurs; extremities well-perfused with no edema  Abd: +bowel sounds, soft, non-tender, no palpable organomegaly; healed Pfannenstiel incision  Just above the incision, there is a protruding 5 cm bulge. When she is supine, this is easily reducible. The edges of the fascial defect are well-defined and measure about 3 cm across. No other hernias palpated.  Skin: Warm, dry; no sign of jaundice  Psychiatric - alert and oriented x 4; calm mood and affect        Assessment & Plan Rodman Key K. Ricky Gallery MD; 02/12/2017 11:46 AM)    VENTRAL INCISIONAL HERNIA WITHOUT OBSTRUCTION OR GANGRENE (K43.2)    Current Plans  Schedule for Surgery - Open ventral incisonal hernia repair with mesh. The surgical procedure has been discussed with the patient. Potential risks, benefits, alternative treatments, and expected outcomes have been explained. All of the patient's questions at this time have been answered. The likelihood of reaching the patient's treatment goal is good. The patient understand the proposed surgical procedure and wishes to proceed.  Jasmin Stewart. Georgette Dover, MD, [facility] Surgery  General/ Trauma Surgery  02/12/2017 9:15 PM

## 2017-05-18 ENCOUNTER — Ambulatory Visit: Payer: Self-pay | Admitting: Surgery

## 2017-05-29 NOTE — Pre-Procedure Instructions (Signed)
Jasmin Stewart  05/29/2017      [facility] [phone] - [place], [place] - [address] [place] [address] Phone: [phone] Fax: [phone]  [place], [place] - [place] [place] [address] Phone: [phone] Fax: [phone]    Your procedure is scheduled on February 26  Report to [place] at [place].M.  Call this number if you have problems the morning of surgery:  [phone]   Remember:  Do not eat food or drink liquids after midnight.  Continue all medications as directed by your physician except follow these medication instructions before surgery below   Take these medicines the morning of surgery with A SIP OF WATER  acetaminophen (TYLENOL)  esomeprazole (NEXIUM) 7 days prior to surgery STOP taking any Aspirin(unless otherwise instructed by your surgeon), Aleve, Naproxen, Ibuprofen, Motrin, Advil, Goody's, BC's, all herbal medications, fish oil, and all vitamins    Do not wear jewelry, make-up or nail polish.  Do not wear lotions, powders, or perfumes, or deodorant.  Do not shave 48 hours prior to surgery.  Men may shave face and neck.  Do not bring valuables to the hospital.  [facility] is not responsible for any belongings or valuables.  Contacts, dentures or bridgework may not be worn into surgery.  Leave your suitcase in the car.  After surgery it may be brought to your room.  For patients admitted to the hospital, discharge time will be determined by your treatment team.  Patients discharged the day of surgery will not be allowed to drive home.    Special instructions:   [place]- Preparing For Surgery  Before surgery, you can play an important role. Because skin is not sterile, your skin needs to be as free of germs as possible. You can reduce the number of germs on your skin by  washing with CHG (chlorahexidine gluconate) Soap before surgery.  CHG is an antiseptic cleaner which kills germs and bonds with the skin to continue killing germs even after washing.  Please do not use if you have an allergy to CHG or antibacterial soaps. If your skin becomes reddened/irritated stop using the CHG.  Do not shave (including legs and underarms) for at least 48 hours prior to first CHG shower. It is OK to shave your face.  Please follow these instructions carefully.   1. Shower the NIGHT BEFORE SURGERY and the MORNING OF SURGERY with CHG.   2. If you chose to wash your hair, wash your hair first as usual with your normal shampoo.  3. After you shampoo, rinse your hair and body thoroughly to remove the shampoo.  4. Use CHG as you would any other liquid soap. You can apply CHG directly to the skin and wash gently with a scrungie or a clean washcloth.   5. Apply the CHG Soap to your body ONLY FROM THE NECK DOWN.  Do not use on open wounds or open sores. Avoid contact with your eyes, ears, mouth and genitals (private parts). Wash Face and genitals (private parts)  with your normal soap.  6. Wash thoroughly, paying special attention to the area where your surgery will be performed.  7. Thoroughly rinse your body with warm water from the neck down.  8. DO NOT shower/wash with your normal soap after using and  rinsing off the CHG Soap.  9. Pat yourself dry with a CLEAN TOWEL.  10. Wear CLEAN PAJAMAS to bed the night before surgery, wear comfortable clothes the morning of surgery  11. Place CLEAN SHEETS on your bed the night of your first shower and DO NOT SLEEP WITH PETS.    Day of Surgery: Do not apply any deodorants/lotions. Please wear clean clothes to the hospital/surgery center.      Please read over the following fact sheets that you were given.

## 2017-05-30 ENCOUNTER — Encounter (HOSPITAL_COMMUNITY): Payer: Self-pay

## 2017-05-30 ENCOUNTER — Encounter (HOSPITAL_COMMUNITY)
Admission: RE | Admit: 2017-05-30 | Discharge: 2017-05-30 | Disposition: A | Discharge status: Home / Self Care | Payer: BLUE CROSS/BLUE SHIELD | Source: Ambulatory Visit | Attending: Surgery | Admitting: Surgery

## 2017-05-30 ENCOUNTER — Other Ambulatory Visit: Payer: Self-pay

## 2017-05-30 DIAGNOSIS — Z01810 Encounter for preprocedural cardiovascular examination: Secondary | ICD-10-CM | POA: Insufficient documentation

## 2017-05-30 DIAGNOSIS — Z01812 Encounter for preprocedural laboratory examination: Secondary | ICD-10-CM | POA: Diagnosis not present

## 2017-05-30 HISTORY — DX: Leiomyoma of uterus, unspecified: D25.9

## 2017-05-30 LAB — CBC
HEMATOCRIT: 41.5 % (ref 36.0–46.0)
HEMOGLOBIN: 13.8 g/dL (ref 12.0–15.0)
MCH: 29.1 pg (ref 26.0–34.0)
MCHC: 33.3 g/dL (ref 30.0–36.0)
MCV: 87.6 fL (ref 78.0–100.0)
Platelets: 408 10*3/uL — ABNORMAL HIGH (ref 150–400)
RBC: 4.74 MIL/uL (ref 3.87–5.11)
RDW: 15 % (ref 11.5–15.5)
WBC: 13.2 10*3/uL — AB (ref 4.0–10.5)

## 2017-05-30 LAB — BASIC METABOLIC PANEL
ANION GAP: 11 (ref 5–15)
BUN: 15 mg/dL (ref 6–20)
CHLORIDE: 103 mmol/L (ref 101–111)
CO2: 23 mmol/L (ref 22–32)
Calcium: 9.3 mg/dL (ref 8.9–10.3)
Creatinine, Ser: 0.63 mg/dL (ref 0.44–1.00)
GFR calc non Af Amer: >60 mL/min (ref >60)
GLUCOSE: 102 mg/dL — AB (ref 65–99)
POTASSIUM: 3.8 mmol/L (ref 3.5–5.1)
Sodium: 137 mmol/L (ref 135–145)

## 2017-05-30 NOTE — Progress Notes (Signed)
PCP - Mackie Pai Cardiologist - [place]  Chest x-ray - not needed EKG - 05/30/17 Stress Test - denies ECHO - denies Cardiac Cath - denies   Anesthesia review: No  Patient denies shortness of breath, fever, cough and chest pain at PAT appointment   Patient verbalized understanding of instructions that were given to them at the PAT appointment. Patient was also instructed that they will need to review over the PAT instructions again at home before surgery.

## 2017-05-30 NOTE — Progress Notes (Signed)
PCP - Mackie Pai Cardiologist - [place]  Chest x-ray - not needed EKG - 05/30/17 Stress Test - denies ECHO - denies Cardiac Cath - denies   Anesthesia review: yes   Patient denies shortness of breath, fever, cough and chest pain at PAT appointment   Patient verbalized understanding of instructions that were given to them at the PAT appointment. Patient was also instructed that they will need to review over the PAT instructions again at home before surgery.

## 2017-05-31 DIAGNOSIS — D72829 Elevated white blood cell count, unspecified: Secondary | ICD-10-CM | POA: Diagnosis not present

## 2017-05-31 NOTE — Progress Notes (Signed)
Anesthesia chart review: Patient is a 52 year old female scheduled for open ventral hernia repair with mesh on 06/05/2017 by Dr. Donnie Mesa.  History includes smoking, hypercholesterolemia, hypoglycemia, hypokalemia, PVCs, uterine fibroids, c-sections, hernia repair. BMI is consistent with obesity.  PCP is [employer], [employer]. Cardiologist Dr. Quay Burow, last visit 06/09/16. She had recurrent symptoms of palpitations as she had when PVCs were noted on event monitor in 08/2010. She took metoprolol for 6 months then. These recurrent symptoms were in the setting of sinusitis/PNA with pseudoephedrine. Following recovery, her symptoms resolved. EKG showed SR without ectopy, so no further cardiac work-up was felt indicated at that time.  Meds include Nexium.  BP (!) 130/59   Pulse 83   Temp 36.8 C   Resp 20   Ht 5' 6.5" (1.689 m)   Wt 247 lb 12.8 oz (112.4 kg)   LMP 04/29/2017 Comment: patient has some spotting last spotting was in January   SpO2 96%   BMI 39.40 kg/m   EKG 05/30/17: NSR.  Preoperative labs noted.  She has 4 urine hCG on the day of surgery.  If no acute changes and anticipate that she can proceed as planned.  George Hugh [facility] Stay [facility]/Anesthesiology Phone [phone] 05/31/2017 1:01 PM

## 2017-06-05 ENCOUNTER — Encounter (HOSPITAL_COMMUNITY): Payer: Self-pay | Admitting: Urology

## 2017-06-05 ENCOUNTER — Ambulatory Visit (HOSPITAL_COMMUNITY): Payer: BLUE CROSS/BLUE SHIELD | Admitting: Vascular Surgery

## 2017-06-05 ENCOUNTER — Ambulatory Visit (HOSPITAL_COMMUNITY): Payer: BLUE CROSS/BLUE SHIELD | Admitting: Anesthesiology

## 2017-06-05 ENCOUNTER — Other Ambulatory Visit: Payer: Self-pay

## 2017-06-05 ENCOUNTER — Encounter (HOSPITAL_COMMUNITY)
Admission: RE | Disposition: A | Discharge status: Home / Self Care | Payer: Self-pay | Source: Ambulatory Visit | Attending: Surgery

## 2017-06-05 ENCOUNTER — Ambulatory Visit (HOSPITAL_COMMUNITY)
Admission: RE | Admit: 2017-06-05 | Discharge: 2017-06-07 | Disposition: A | Discharge status: Home / Self Care | Payer: BLUE CROSS/BLUE SHIELD | Source: Ambulatory Visit | Attending: Surgery | Admitting: Surgery

## 2017-06-05 DIAGNOSIS — Z8719 Personal history of other diseases of the digestive system: Secondary | ICD-10-CM | POA: Insufficient documentation

## 2017-06-05 DIAGNOSIS — K432 Incisional hernia without obstruction or gangrene: Secondary | ICD-10-CM | POA: Insufficient documentation

## 2017-06-05 DIAGNOSIS — Z79899 Other long term (current) drug therapy: Secondary | ICD-10-CM | POA: Diagnosis not present

## 2017-06-05 DIAGNOSIS — F17210 Nicotine dependence, cigarettes, uncomplicated: Secondary | ICD-10-CM | POA: Insufficient documentation

## 2017-06-05 DIAGNOSIS — K439 Ventral hernia without obstruction or gangrene: Secondary | ICD-10-CM | POA: Diagnosis present

## 2017-06-05 DIAGNOSIS — I493 Ventricular premature depolarization: Secondary | ICD-10-CM | POA: Diagnosis not present

## 2017-06-05 HISTORY — DX: Personal history of other medical treatment: Z92.89

## 2017-06-05 HISTORY — DX: Anemia, unspecified: D64.9

## 2017-06-05 HISTORY — PX: INSERTION OF MESH: SHX5868

## 2017-06-05 HISTORY — DX: Unspecified asthma, uncomplicated: J45.909

## 2017-06-05 HISTORY — PX: VENTRAL HERNIA REPAIR: SHX424

## 2017-06-05 LAB — CBC
HCT: 40.4 % (ref 36.0–46.0)
Hemoglobin: 13.2 g/dL (ref 12.0–15.0)
MCH: 29 pg (ref 26.0–34.0)
MCHC: 32.7 g/dL (ref 30.0–36.0)
MCV: 88.8 fL (ref 78.0–100.0)
Platelets: 347 10*3/uL (ref 150–400)
RBC: 4.55 MIL/uL (ref 3.87–5.11)
RDW: 15.3 % (ref 11.5–15.5)
WBC: 12.6 10*3/uL — ABNORMAL HIGH (ref 4.0–10.5)

## 2017-06-05 LAB — CREATININE, SERUM
CREATININE: 0.69 mg/dL (ref 0.44–1.00)
GFR calc Af Amer: >60 mL/min (ref >60)
GFR calc non Af Amer: >60 mL/min (ref >60)

## 2017-06-05 LAB — POCT PREGNANCY, URINE: Preg Test, Ur: NEGATIVE

## 2017-06-05 SURGERY — REPAIR, HERNIA, VENTRAL
Anesthesia: General | Site: Abdomen

## 2017-06-05 MED ORDER — PHENYLEPHRINE 40 MCG/ML (10ML) SYRINGE FOR IV PUSH (FOR BLOOD PRESSURE SUPPORT)
PREFILLED_SYRINGE | INTRAVENOUS | Status: DC | PRN
  Administered 2017-06-05 (×5): 80 ug via INTRAVENOUS

## 2017-06-05 MED ORDER — CEFAZOLIN SODIUM-DEXTROSE 2-4 GM/100ML-% IV SOLN
INTRAVENOUS | Status: AC
  Filled 2017-06-05: qty 100

## 2017-06-05 MED ORDER — ONDANSETRON HCL 4 MG/2ML IJ SOLN
INTRAMUSCULAR | Status: AC
  Filled 2017-06-05: qty 2

## 2017-06-05 MED ORDER — SUGAMMADEX SODIUM 200 MG/2ML IV SOLN
INTRAVENOUS | Status: AC
  Filled 2017-06-05: qty 2

## 2017-06-05 MED ORDER — ONDANSETRON HCL 4 MG/2ML IJ SOLN: 4.0000 mg | Freq: Four times a day (QID) | INTRAMUSCULAR | Status: DC | PRN

## 2017-06-05 MED ORDER — POLYETHYLENE GLYCOL 3350 17 G PO PACK
17.0000 g | PACK | Freq: Every day | ORAL | Status: DC | PRN
  Administered 2017-06-07: 17 g via ORAL
  Filled 2017-06-05: qty 1

## 2017-06-05 MED ORDER — METHOCARBAMOL 500 MG PO TABS
500.0000 mg | ORAL_TABLET | Freq: Four times a day (QID) | ORAL | Status: DC | PRN
  Administered 2017-06-05 – 2017-06-06 (×4): 500 mg via ORAL
  Filled 2017-06-05 (×3): qty 1

## 2017-06-05 MED ORDER — HYDROMORPHONE HCL 1 MG/ML IJ SOLN
INTRAMUSCULAR | Status: AC
  Administered 2017-06-05: 0.5 mg via INTRAVENOUS
  Filled 2017-06-05: qty 1

## 2017-06-05 MED ORDER — FENTANYL CITRATE (PF) 250 MCG/5ML IJ SOLN
INTRAMUSCULAR | Status: DC | PRN
  Administered 2017-06-05 (×4): 50 ug via INTRAVENOUS

## 2017-06-05 MED ORDER — METHOCARBAMOL 500 MG PO TABS
ORAL_TABLET | ORAL | Status: AC
  Administered 2017-06-05: 500 mg via ORAL
  Filled 2017-06-05: qty 1

## 2017-06-05 MED ORDER — FENTANYL CITRATE (PF) 100 MCG/2ML IJ SOLN
25.0000 ug | INTRAMUSCULAR | Status: DC | PRN
  Administered 2017-06-05 (×3): 50 ug via INTRAVENOUS

## 2017-06-05 MED ORDER — FENTANYL CITRATE (PF) 250 MCG/5ML IJ SOLN
INTRAMUSCULAR | Status: AC
  Filled 2017-06-05: qty 5

## 2017-06-05 MED ORDER — HYDROMORPHONE HCL 1 MG/ML IJ SOLN
1.0000 mg | INTRAMUSCULAR | Status: DC | PRN
  Administered 2017-06-05 (×2): 1 mg via INTRAVENOUS
  Filled 2017-06-05 (×2): qty 1

## 2017-06-05 MED ORDER — ACETAMINOPHEN 650 MG RE SUPP: 650.0000 mg | Freq: Four times a day (QID) | RECTAL | Status: DC | PRN

## 2017-06-05 MED ORDER — PROPOFOL 10 MG/ML IV BOLUS
INTRAVENOUS | Status: DC | PRN
  Administered 2017-06-05 (×2): 50 mg via INTRAVENOUS
  Administered 2017-06-05: 150 mg via INTRAVENOUS

## 2017-06-05 MED ORDER — FENTANYL CITRATE (PF) 100 MCG/2ML IJ SOLN
INTRAMUSCULAR | Status: AC
  Administered 2017-06-05: 50 ug via INTRAVENOUS
  Filled 2017-06-05: qty 2

## 2017-06-05 MED ORDER — CHLORHEXIDINE GLUCONATE CLOTH 2 % EX PADS: 6.0000 | MEDICATED_PAD | Freq: Once | CUTANEOUS | Status: DC

## 2017-06-05 MED ORDER — SODIUM CHLORIDE 0.9 % IV SOLN
INTRAVENOUS | Status: DC
  Administered 2017-06-05 – 2017-06-06 (×2): via INTRAVENOUS

## 2017-06-05 MED ORDER — BUPIVACAINE-EPINEPHRINE (PF) 0.5% -1:200000 IJ SOLN
INTRAMUSCULAR | Status: DC | PRN
  Administered 2017-06-05: 30 mL

## 2017-06-05 MED ORDER — HYDROMORPHONE HCL 1 MG/ML IJ SOLN
0.5000 mg | INTRAMUSCULAR | Status: DC | PRN
  Administered 2017-06-05 (×2): 0.5 mg via INTRAVENOUS

## 2017-06-05 MED ORDER — PHENYLEPHRINE 40 MCG/ML (10ML) SYRINGE FOR IV PUSH (FOR BLOOD PRESSURE SUPPORT)
PREFILLED_SYRINGE | INTRAVENOUS | Status: AC
  Filled 2017-06-05: qty 10

## 2017-06-05 MED ORDER — DIPHENHYDRAMINE HCL 50 MG/ML IJ SOLN: 25.0000 mg | Freq: Four times a day (QID) | INTRAMUSCULAR | Status: DC | PRN

## 2017-06-05 MED ORDER — LACTATED RINGERS IV SOLN
INTRAVENOUS | Status: DC | PRN
  Administered 2017-06-05 (×3): via INTRAVENOUS

## 2017-06-05 MED ORDER — CEFAZOLIN SODIUM-DEXTROSE 2-4 GM/100ML-% IV SOLN
2.0000 g | INTRAVENOUS | Status: AC
  Administered 2017-06-05: 2 g via INTRAVENOUS

## 2017-06-05 MED ORDER — ROCURONIUM BROMIDE 10 MG/ML (PF) SYRINGE
PREFILLED_SYRINGE | INTRAVENOUS | Status: AC
Start: 2017-06-05 — End: ?
  Filled 2017-06-05: qty 5

## 2017-06-05 MED ORDER — KETOROLAC TROMETHAMINE 30 MG/ML IJ SOLN
INTRAMUSCULAR | Status: AC
  Administered 2017-06-05: 30 mg via INTRAVENOUS
  Filled 2017-06-05: qty 1

## 2017-06-05 MED ORDER — DEXAMETHASONE SODIUM PHOSPHATE 10 MG/ML IJ SOLN
INTRAMUSCULAR | Status: DC | PRN
  Administered 2017-06-05: 10 mg via INTRAVENOUS

## 2017-06-05 MED ORDER — METOCLOPRAMIDE HCL 5 MG/ML IJ SOLN: 10.0000 mg | Freq: Once | INTRAMUSCULAR | Status: DC | PRN

## 2017-06-05 MED ORDER — PROPOFOL 10 MG/ML IV BOLUS
INTRAVENOUS | Status: AC
  Filled 2017-06-05: qty 20

## 2017-06-05 MED ORDER — MIDAZOLAM HCL 5 MG/5ML IJ SOLN
INTRAMUSCULAR | Status: DC | PRN
  Administered 2017-06-05: 2 mg via INTRAVENOUS

## 2017-06-05 MED ORDER — ROCURONIUM BROMIDE 10 MG/ML (PF) SYRINGE
PREFILLED_SYRINGE | INTRAVENOUS | Status: DC | PRN
  Administered 2017-06-05: 20 mg via INTRAVENOUS
  Administered 2017-06-05: 80 mg via INTRAVENOUS
  Administered 2017-06-05: 20 mg via INTRAVENOUS

## 2017-06-05 MED ORDER — ONDANSETRON 4 MG PO TBDP: 4.0000 mg | ORAL_TABLET | Freq: Four times a day (QID) | ORAL | Status: DC | PRN

## 2017-06-05 MED ORDER — MIDAZOLAM HCL 2 MG/2ML IJ SOLN
INTRAMUSCULAR | Status: AC
  Filled 2017-06-05: qty 2

## 2017-06-05 MED ORDER — ENOXAPARIN SODIUM 40 MG/0.4ML ~~LOC~~ SOLN
40.0000 mg | SUBCUTANEOUS | Status: DC
  Administered 2017-06-06 – 2017-06-07 (×2): 40 mg via SUBCUTANEOUS
  Filled 2017-06-05 (×2): qty 0.4

## 2017-06-05 MED ORDER — BUPIVACAINE-EPINEPHRINE (PF) 0.5% -1:200000 IJ SOLN
INTRAMUSCULAR | Status: AC
  Filled 2017-06-05: qty 30

## 2017-06-05 MED ORDER — ONDANSETRON HCL 4 MG/2ML IJ SOLN
INTRAMUSCULAR | Status: DC | PRN
  Administered 2017-06-05: 4 mg via INTRAVENOUS

## 2017-06-05 MED ORDER — ACETAMINOPHEN 325 MG PO TABS
650.0000 mg | ORAL_TABLET | Freq: Four times a day (QID) | ORAL | Status: DC | PRN
  Administered 2017-06-06: 650 mg via ORAL
  Filled 2017-06-05: qty 2

## 2017-06-05 MED ORDER — LIDOCAINE 2% (20 MG/ML) 5 ML SYRINGE
INTRAMUSCULAR | Status: DC | PRN
  Administered 2017-06-05: 100 mg via INTRAVENOUS

## 2017-06-05 MED ORDER — KETOROLAC TROMETHAMINE 30 MG/ML IJ SOLN
INTRAMUSCULAR | Status: DC | PRN
  Administered 2017-06-05: 30 mg via INTRAVENOUS

## 2017-06-05 MED ORDER — LIDOCAINE 2% (20 MG/ML) 5 ML SYRINGE
INTRAMUSCULAR | Status: AC
  Filled 2017-06-05: qty 5

## 2017-06-05 MED ORDER — OXYCODONE HCL 5 MG PO TABS
5.0000 mg | ORAL_TABLET | ORAL | Status: DC | PRN
  Administered 2017-06-05 – 2017-06-06 (×7): 10 mg via ORAL
  Filled 2017-06-05 (×6): qty 2

## 2017-06-05 MED ORDER — DEXAMETHASONE SODIUM PHOSPHATE 10 MG/ML IJ SOLN
INTRAMUSCULAR | Status: AC
  Filled 2017-06-05: qty 1

## 2017-06-05 MED ORDER — DIPHENHYDRAMINE HCL 25 MG PO CAPS: 25.0000 mg | ORAL_CAPSULE | Freq: Four times a day (QID) | ORAL | Status: DC | PRN

## 2017-06-05 MED ORDER — MEPERIDINE HCL 50 MG/ML IJ SOLN: 6.2500 mg | INTRAMUSCULAR | Status: DC | PRN

## 2017-06-05 MED ORDER — LACTATED RINGERS IV SOLN: INTRAVENOUS | Status: DC

## 2017-06-05 MED ORDER — KETOROLAC TROMETHAMINE 30 MG/ML IJ SOLN
30.0000 mg | Freq: Four times a day (QID) | INTRAMUSCULAR | Status: DC
  Administered 2017-06-05 – 2017-06-06 (×3): 30 mg via INTRAVENOUS
  Filled 2017-06-05 (×5): qty 1

## 2017-06-05 MED ORDER — OXYCODONE HCL 5 MG PO TABS
ORAL_TABLET | ORAL | Status: AC
  Administered 2017-06-05: 10 mg via ORAL
  Filled 2017-06-05: qty 2

## 2017-06-05 MED ORDER — 0.9 % SODIUM CHLORIDE (POUR BTL) OPTIME
TOPICAL | Status: DC | PRN
  Administered 2017-06-05: 1000 mL

## 2017-06-05 MED ORDER — CEFAZOLIN SODIUM-DEXTROSE 2-4 GM/100ML-% IV SOLN
2.0000 g | Freq: Three times a day (TID) | INTRAVENOUS | Status: AC
  Administered 2017-06-05: 2 g via INTRAVENOUS
  Filled 2017-06-05: qty 100

## 2017-06-05 MED ORDER — SUGAMMADEX SODIUM 200 MG/2ML IV SOLN
INTRAVENOUS | Status: DC | PRN
  Administered 2017-06-05: 224.8 mg via INTRAVENOUS

## 2017-06-05 SURGICAL SUPPLY — 40 items
BENZOIN TINCTURE PRP APPL 2/3 (GAUZE/BANDAGES/DRESSINGS) ×2 IMPLANT
BINDER ABDOMINAL 12 ML 46-62 (SOFTGOODS) ×2 IMPLANT
BLADE CLIPPER SURG (BLADE) IMPLANT
CANISTER SUCT 3000ML PPV (MISCELLANEOUS) ×2 IMPLANT
CHLORAPREP W/TINT 26ML (MISCELLANEOUS) IMPLANT
COVER SURGICAL LIGHT HANDLE (MISCELLANEOUS) ×2 IMPLANT
DRAPE LAPAROSCOPIC ABDOMINAL (DRAPES) ×2 IMPLANT
DRSG TEGADERM 4X4.75 (GAUZE/BANDAGES/DRESSINGS) ×2 IMPLANT
ELECT BLADE 6.5 EXT (BLADE) ×2 IMPLANT
ELECT CAUTERY BLADE 6.4 (BLADE) ×2 IMPLANT
ELECT REM PT RETURN 9FT ADLT (ELECTROSURGICAL) ×2
ELECTRODE REM PT RTRN 9FT ADLT (ELECTROSURGICAL) ×1 IMPLANT
GAUZE SPONGE 4X4 12PLY STRL (GAUZE/BANDAGES/DRESSINGS) ×2 IMPLANT
GLOVE BIO SURGEON STRL SZ7 (GLOVE) ×2 IMPLANT
GLOVE BIOGEL PI IND STRL 7.5 (GLOVE) ×1 IMPLANT
GLOVE BIOGEL PI INDICATOR 7.5 (GLOVE) ×1
GOWN STRL REUS W/ TWL LRG LVL3 (GOWN DISPOSABLE) ×2 IMPLANT
GOWN STRL REUS W/TWL LRG LVL3 (GOWN DISPOSABLE) ×2
KIT BASIN OR (CUSTOM PROCEDURE TRAY) ×2 IMPLANT
KIT ROOM TURNOVER OR (KITS) ×2 IMPLANT
LIGASURE IMPACT 36 18CM CVD LR (INSTRUMENTS) ×2 IMPLANT
MARKER SKIN DUAL TIP RULER LAB (MISCELLANEOUS) ×2 IMPLANT
MESH VENTRALIGHT ST 4.5IN (Mesh General) ×2 IMPLANT
NEEDLE HYPO 25GX1X1/2 BEV (NEEDLE) ×2 IMPLANT
NS IRRIG 1000ML POUR BTL (IV SOLUTION) ×2 IMPLANT
PACK GENERAL/GYN (CUSTOM PROCEDURE TRAY) ×2 IMPLANT
PAD ARMBOARD 7.5X6 YLW CONV (MISCELLANEOUS) ×4 IMPLANT
STRIP CLOSURE SKIN 1/2X4 (GAUZE/BANDAGES/DRESSINGS) ×2 IMPLANT
SUT MNCRL AB 4-0 PS2 18 (SUTURE) ×2 IMPLANT
SUT NOVA NAB GS-21 0 18 T12 DT (SUTURE) ×4 IMPLANT
SUT NOVA NAB GS-21 1 T12 (SUTURE) IMPLANT
SUT SILK 2 0 (SUTURE) ×1
SUT SILK 2-0 18XBRD TIE 12 (SUTURE) ×1 IMPLANT
SUT VIC AB 2-0 SH 18 (SUTURE) ×2 IMPLANT
SUT VIC AB 3-0 SH 27 (SUTURE) ×1
SUT VIC AB 3-0 SH 27XBRD (SUTURE) ×1 IMPLANT
SYR CONTROL 10ML LL (SYRINGE) ×2 IMPLANT
TOWEL OR 17X24 6PK STRL BLUE (TOWEL DISPOSABLE) ×2 IMPLANT
TOWEL OR 17X26 10 PK STRL BLUE (TOWEL DISPOSABLE) ×2 IMPLANT
TRAY FOLEY W/METER SILVER 14FR (SET/KITS/TRAYS/PACK) ×2 IMPLANT

## 2017-06-05 NOTE — Op Note (Signed)
Ventral Hernia Repair with mesh Procedure Note  Indications: Symptomatic ventral hernia from prior C-section  Pre-operative Diagnosis: Ventral hernia  Post-operative Diagnosis: Ventral hernia 3 x 5 cm defect   Surgeon: Maia Petties   Assistants: none  Anesthesia: General endotracheal anesthesia  ASA Class: 2  Procedure Details  The patient was seen in the Holding Room. The risks, benefits, complications, treatment options, and expected outcomes were discussed with the patient. The possibilities of reaction to medication, pulmonary aspiration, perforation of viscus, bleeding, recurrent infection, the need for additional procedures, failure to diagnose a condition, and creating a complication requiring transfusion or operation were discussed with the patient. The patient concurred with the proposed plan, giving informed consent.  The site of surgery properly noted/marked. The patient was taken to the operating room, identified as Jasmin Stewart and the procedure verified as ventral hernia repair. A Time Out was held and the above information confirmed.  The patient was placed supine.  After establishing general anesthesia, the abdomen was prepped with Chloraprep and draped in sterile fashion.  We made a vertical incision over the palpable hernia just above her Pfannenstiel incision. Dissection was carried down to the hernia sac located above the fascia and mobilized from surrounding structures.  We opened the hernia sac and found a large amount of omentum.  We resected part of the omentum and excised the hernia sac.  Intact fascia was identified circumferentially around the defect, which measured 3 x 6 cm.  We used a round Ventralight mesh and secured this to the fascia with interrupted 0 Novofil sutures.  The fascial defect was reapproximated with interrupted figure-of-8 1 Novofil sutures.  The subcutaneous tissues were irrigated and infiltrated with 0.25% marcaine with epinephrine.  The  subcutaneous tissues were closed with 2-0 Vicryl. The skin incision was closed with a 4-0 subcuticular closure.  Steri-Strips were applied at the end of the operation.    Instrument, sponge, and needle counts were correct prior to closure and at the conclusion of the case.   Findings: Omentum in hernia sac; 3 x 6 cm defect  Estimated Blood Loss:  less than 50 mL         Drains: none                      Complications:  None; patient tolerated the procedure well.         Disposition: PACU - hemodynamically stable.         Condition: stable  Jasmin Stewart. Georgette Dover, MD, [facility] Surgery  General/ Trauma Surgery  06/05/2017 9:40 AM

## 2017-06-05 NOTE — Anesthesia Procedure Notes (Signed)
Procedure Name: Intubation Date/Time: 06/05/2017 7:38 AM Performed by: Renato Shin, CRNA Pre-anesthesia Checklist: Patient identified, Emergency Drugs available, Suction available and Patient being monitored Patient Re-evaluated:Patient Re-evaluated prior to induction Oxygen Delivery Method: Circle system utilized Preoxygenation: Pre-oxygenation with 100% oxygen Induction Type: IV induction Ventilation: Mask ventilation without difficulty Laryngoscope Size: Miller and 3 Grade View: Grade I Tube type: Oral Tube size: 7.0 mm Number of attempts: 1 Airway Equipment and Method: Stylet Placement Confirmation: ETT inserted through vocal cords under direct vision,  positive ETCO2,  CO2 detector and breath sounds checked- equal and bilateral Secured at: 20 cm Tube secured with: Tape Dental Injury: Teeth and Oropharynx as per pre-operative assessment

## 2017-06-05 NOTE — H&P (Signed)
History of Present Illness The patient is a 52 year old female who presents with an incisional hernia. Referred by Mackie Pai, PA-C for ventral hernia   This is a 52 yo female who is a smoker (1/2 ppd) who presents after several C-sections.She had developed a hernia in her lower midline when she lived in [place]. In 2007, she underwent open mesh repair through a Pfannenstiel incision.No records available. Shortly after that time, she was packing her apartment to move to [facility]. She admits that she may have lifted too much. She developed a visible bulge in her lower midline, but has not sought any surgical attention for this in the last 10.5 years.Earlier this year, she had issues with a URI and severe cough. The hernia protruded more and extended to the right. She finally comes in today for surgical evaluation. She denies any change in her bowel habits. The hernia is quite uncomfortable. She works two jobs and one of these requires lifting and extended periods of time spent standing. No imaging.    Past Surgical History  Cesarean Section - Multiple   Diagnostic Studies History  Colonoscopy never  Mammogram within last year  Pap Smear 1-5 years ago   Allergies   No Known Drug Allergies 02/12/2017   Medication History  NexIUM (20MG  Capsule DR, Oral) Active.   Social History   Caffeine use Coffee.  No alcohol use  No drug use  Tobacco use Current every day smoker.   Family History  Migraine Headache Daughter, Mother.  Thyroid problems Mother.   Pregnancy / Birth History  Age at menarche 67 years.  Contraceptive History Oral contraceptives.  Gravida 4  Irregular periods  Maternal age 6-20  Para 4    Other Problems Asthma Gastric Ulcer  Inguinal Hernia         Review of Systems  General Not Present- Appetite Loss, Chills, Fatigue, Fever, Night Sweats, Weight Gain and Weight Loss.  Skin Not Present- Change in Wart/Mole, Dryness, Hives, Jaundice, New  Lesions, Non-Healing Wounds, Rash and Ulcer.  HEENT Present- Wears glasses/contact lenses. Not Present- Earache, Hearing Loss, Hoarseness, Nose Bleed, Oral Ulcers, Ringing in the Ears, Seasonal Allergies, Sinus Pain, Sore Throat, Visual Disturbances and Yellow Eyes.  Respiratory Not Present- Bloody sputum, Chronic Cough, Difficulty Breathing, Snoring and Wheezing.  Breast Not Present- Breast Mass, Breast Pain, Nipple Discharge and Skin Changes.  Cardiovascular Not Present- Chest Pain, Difficulty Breathing Lying Down, Leg Cramps, Palpitations, Rapid Heart Rate, Shortness of Breath and Swelling of Extremities.  Gastrointestinal Not Present- Abdominal Pain, Bloating, Bloody Stool, Change in Bowel Habits, Chronic diarrhea, Constipation, Difficulty Swallowing, Excessive gas, Gets full quickly at meals, Hemorrhoids, Indigestion, Nausea, Rectal Pain and Vomiting.  Female Genitourinary Not Present- Frequency, Nocturia, Painful Urination, Pelvic Pain and Urgency.  Musculoskeletal Not Present- Back Pain, Joint Pain, Joint Stiffness, Muscle Pain, Muscle Weakness and Swelling of Extremities.  Neurological Not Present- Decreased Memory, Fainting, Headaches, Numbness, Seizures, Tingling, Tremor, Trouble walking and Weakness.  Psychiatric Not Present- Anxiety, Bipolar, Change in Sleep Pattern, Depression, Fearful and Frequent crying.  Endocrine Not Present- Cold Intolerance, Excessive Hunger, Hair Changes, Heat Intolerance, Hot flashes and New Diabetes.  Hematology Not Present- Blood Thinners, Easy Bruising, Excessive bleeding, Gland problems, HIV and Persistent Infections.    Vitals Weight: 243 lb Height: 66.5in  Body Surface Area: 2.18 m Body Mass Index: 38.63 kg/m  Pulse: 75 (Regular)  BP: 120/78 (Sitting, Left Arm, Standard)            Physical  Exam    The physical exam findings are as follows:  Note:WDWN in NAD  Eyes: Pupils equal, round; sclera anicteric  HENT: Oral mucosa moist; good  dentition  Neck: No masses palpated, no thyromegaly  Lungs: CTA bilaterally; normal respiratory effort  CV: Regular rate and rhythm; no murmurs; extremities well-perfused with no edema  Abd: +bowel sounds, soft, non-tender, no palpable organomegaly; healed Pfannenstiel incision  Just above the incision, there is a protruding 5 cm bulge. When she is supine, this is easily reducible. The edges of the fascial defect are well-defined and measure about 3 cm across. No other hernias palpated.  Skin: Warm, dry; no sign of jaundice  Psychiatric - alert and oriented x 4; calm mood and affect        Assessment & Plan     VENTRAL INCISIONAL HERNIA WITHOUT OBSTRUCTION OR GANGRENE (K43.2)    Current Plans  Schedule for Surgery - Open ventral incisonal hernia repair with mesh. The surgical procedure has been discussed with the patient. Potential risks, benefits, alternative treatments, and expected outcomes have been explained. All of the patient's questions at this time have been answered. The likelihood of reaching the patient's treatment goal is good. The patient understand the proposed surgical procedure and wishes to proceed.  Imogene Burn. Georgette Dover, MD, [facility] Surgery  General/ Trauma Surgery  06/05/2017 7:10 AM

## 2017-06-05 NOTE — Anesthesia Postprocedure Evaluation (Signed)
Anesthesia Post Note  Patient: Jasmin Stewart  Procedure(s) Performed: OPEN VENTRAL HERNIA REPAIR WITH MESH (N/A Abdomen) INSERTION OF MESH (N/A Abdomen)     Patient location during evaluation: PACU Anesthesia Type: General Level of consciousness: awake and alert Pain management: pain level controlled Vital Signs Assessment: post-procedure vital signs reviewed and stable Respiratory status: spontaneous breathing, nonlabored ventilation, respiratory function stable and patient connected to nasal cannula oxygen Cardiovascular status: blood pressure returned to baseline and stable Postop Assessment: no apparent nausea or vomiting Anesthetic complications: no    Last Vitals:  Vitals:   06/05/17 1113 06/05/17 1140  BP: 104/72   Pulse: (!) 58   Resp: 13   Temp:  36.6 C  SpO2: 90%     Last Pain:  Vitals:   06/05/17 1140  TempSrc:   PainSc: 2                  Montez Hageman

## 2017-06-05 NOTE — Anesthesia Preprocedure Evaluation (Signed)
Anesthesia Evaluation  Patient identified by MRN, date of birth, ID band Patient awake    Reviewed: Allergy & Precautions, NPO status , Patient's Chart, lab work & pertinent test results  Airway Mallampati: II  TM Distance: >3 FB Neck ROM: Full    Dental no notable dental hx.    Pulmonary Current Smoker,    Pulmonary exam normal breath sounds clear to auscultation       Cardiovascular negative cardio ROS Normal cardiovascular exam Rhythm:Regular Rate:Normal     Neuro/Psych negative neurological ROS  negative psych ROS   GI/Hepatic negative GI ROS, Neg liver ROS,   Endo/Other  negative endocrine ROS  Renal/GU negative Renal ROS  negative genitourinary   Musculoskeletal negative musculoskeletal ROS (+)   Abdominal   Peds negative pediatric ROS (+)  Hematology negative hematology ROS (+)   Anesthesia Other Findings   Reproductive/Obstetrics negative OB ROS                             Anesthesia Physical Anesthesia Plan  ASA: II  Anesthesia Plan: General   Post-op Pain Management:    Induction: Intravenous  PONV Risk Score and Plan: 2 and Ondansetron, Dexamethasone, Midazolam and Treatment may vary due to age or medical condition  Airway Management Planned: LMA and Oral ETT  Additional Equipment:   Intra-op Plan:   Post-operative Plan: Extubation in OR  Informed Consent: I have reviewed the patients History and Physical, chart, labs and discussed the procedure including the risks, benefits and alternatives for the proposed anesthesia with the patient or authorized representative who has indicated his/her understanding and acceptance.   Dental advisory given  Plan Discussed with: CRNA  Anesthesia Plan Comments:         Anesthesia Quick Evaluation

## 2017-06-05 NOTE — Transfer of Care (Signed)
Immediate Anesthesia Transfer of Care Note  Patient: Jasmin Stewart  Procedure(s) Performed: OPEN VENTRAL HERNIA REPAIR WITH MESH (N/A Abdomen) INSERTION OF MESH (N/A Abdomen)  Patient Location: PACU  Anesthesia Type:General  Level of Consciousness: awake, alert , oriented and patient cooperative  Airway & Oxygen Therapy: Patient Spontanous Breathing and Patient connected to nasal cannula oxygen  Post-op Assessment: Report given to RN and Post -op Vital signs reviewed and stable  Post vital signs: Reviewed and stable  Last Vitals:  Vitals:   06/05/17 0616  BP: 124/65  Pulse: 67  Resp: 20  Temp: 37.1 C  SpO2: 98%    Last Pain:  Vitals:   06/05/17 0616  TempSrc: Oral         Complications: No apparent anesthesia complications

## 2017-06-06 ENCOUNTER — Encounter (HOSPITAL_COMMUNITY): Payer: Self-pay | Admitting: Surgery

## 2017-06-06 DIAGNOSIS — Z79899 Other long term (current) drug therapy: Secondary | ICD-10-CM | POA: Diagnosis not present

## 2017-06-06 DIAGNOSIS — F17210 Nicotine dependence, cigarettes, uncomplicated: Secondary | ICD-10-CM | POA: Diagnosis not present

## 2017-06-06 DIAGNOSIS — Z8719 Personal history of other diseases of the digestive system: Secondary | ICD-10-CM | POA: Diagnosis not present

## 2017-06-06 DIAGNOSIS — K432 Incisional hernia without obstruction or gangrene: Secondary | ICD-10-CM | POA: Diagnosis not present

## 2017-06-06 LAB — BASIC METABOLIC PANEL
ANION GAP: 11 (ref 5–15)
BUN: 13 mg/dL (ref 6–20)
CALCIUM: 9 mg/dL (ref 8.9–10.3)
CO2: 21 mmol/L — ABNORMAL LOW (ref 22–32)
Chloride: 103 mmol/L (ref 101–111)
Creatinine, Ser: 0.79 mg/dL (ref 0.44–1.00)
GLUCOSE: 91 mg/dL (ref 65–99)
Potassium: 4.3 mmol/L (ref 3.5–5.1)
SODIUM: 135 mmol/L (ref 135–145)

## 2017-06-06 LAB — CBC
HEMATOCRIT: 37.9 % (ref 36.0–46.0)
HEMOGLOBIN: 12.1 g/dL (ref 12.0–15.0)
MCH: 28.5 pg (ref 26.0–34.0)
MCHC: 31.9 g/dL (ref 30.0–36.0)
MCV: 89.2 fL (ref 78.0–100.0)
Platelets: 395 10*3/uL (ref 150–400)
RBC: 4.25 MIL/uL (ref 3.87–5.11)
RDW: 15.8 % — ABNORMAL HIGH (ref 11.5–15.5)
WBC: 18 10*3/uL — AB (ref 4.0–10.5)

## 2017-06-06 MED ORDER — PHENOL 1.4 % MT LIQD: 1.0000 | OROMUCOSAL | Status: DC | PRN

## 2017-06-06 NOTE — Progress Notes (Signed)
1 Day Post-Op   Subjective/Chief Complaint: Very sore, still using some IV pain meds Sore throat No nausea or vomiting Has voided, no flatus or BM Ambulating short distances   Objective: Vital signs in last 24 hours: Temp:  [97.8 F (36.6 C)-98.4 F (36.9 C)] 98.2 F (36.8 C) (02/27 0953) Pulse Rate:  [57-61] 61 (02/27 0953) Resp:  [12-16] 15 (02/27 0612) BP: (98-117)/(47-78) 117/58 (02/27 0953) SpO2:  [90 %-97 %] 97 % (02/27 0953) Last BM Date: 06/04/17  Intake/Output from previous day: 02/26 0701 - 02/27 0700 In: 2206.3 [I.V.:2206.3] Out: 125 [Urine:75; Blood:50] Intake/Output this shift: Total I/O In: 420 [P.O.:420] Out: -   General appearance: alert, cooperative and no distress GI: soft, tender near incision Dressing - some pink drainage; no hematoma  Lab Results:  Recent Labs    06/05/17 1219 06/06/17 0624  WBC 12.6* 18.0*  HGB 13.2 12.1  HCT 40.4 37.9  PLT 347 395   BMET Recent Labs    06/05/17 1219 06/06/17 0624  NA  --  135  K  --  4.3  CL  --  103  CO2  --  21*  GLUCOSE  --  91  BUN  --  13  CREATININE 0.69 0.79  CALCIUM  --  9.0   PT/INR No results for input(s): LABPROT, INR in the last 72 hours. ABG No results for input(s): PHART, HCO3 in the last 72 hours.  Invalid input(s): PCO2, PO2  Studies/Results: No results found.  Anti-infectives: Anti-infectives (From admission, onward)   Start     Dose/Rate Route Frequency Ordered Stop   06/05/17 1600  ceFAZolin (ANCEF) IVPB 2g/100 mL premix     2 g 200 mL/hr over 30 Minutes Intravenous Every 8 hours 06/05/17 0947 06/05/17 1700   06/05/17 0557  ceFAZolin (ANCEF) IVPB 2g/100 mL premix     2 g 200 mL/hr over 30 Minutes Intravenous On call to O.R. 06/05/17 0557 06/05/17 0745   06/05/17 0544  ceFAZolin (ANCEF) 2-4 GM/100ML-% IVPB    Comments:  [name], [name]: cabinet override      06/05/17 0544 06/05/17 0745      Assessment/Plan: s/p Procedure(s): OPEN VENTRAL HERNIA REPAIR  WITH MESH (N/A) INSERTION OF MESH (N/A) Will attempt to wean off IV pain meds today  Encourage ambulation Probable discharge tomorrow.  LOS: 0 days    Jasmin Stewart 06/06/2017

## 2017-06-07 DIAGNOSIS — Z79899 Other long term (current) drug therapy: Secondary | ICD-10-CM | POA: Diagnosis not present

## 2017-06-07 DIAGNOSIS — F17210 Nicotine dependence, cigarettes, uncomplicated: Secondary | ICD-10-CM | POA: Diagnosis not present

## 2017-06-07 DIAGNOSIS — Z8719 Personal history of other diseases of the digestive system: Secondary | ICD-10-CM | POA: Diagnosis not present

## 2017-06-07 DIAGNOSIS — K432 Incisional hernia without obstruction or gangrene: Secondary | ICD-10-CM | POA: Diagnosis not present

## 2017-06-07 MED ORDER — OXYCODONE HCL 5 MG PO TABS: 5.0000 mg | ORAL_TABLET | Freq: Four times a day (QID) | ORAL | 0 refills | Status: DC | PRN

## 2017-06-07 NOTE — Discharge Instructions (Signed)
[facility]      [facility] [phone]  OPEN ABDOMINAL SURGERY: POST OP INSTRUCTIONS  Always review your discharge instruction sheet given to you by the facility where your surgery was performed.  IF YOU HAVE DISABILITY OR FAMILY LEAVE FORMS, YOU MUST BRING THEM TO THE OFFICE FOR PROCESSING.  PLEASE DO NOT GIVE THEM TO YOUR DOCTOR.  1. A prescription for pain medication may be given to you upon discharge.  Take your pain medication as prescribed, if needed.  If narcotic pain medicine is not needed, then you may take acetaminophen (Tylenol) or ibuprofen (Advil) as needed. 2. Take your usually prescribed medications unless otherwise directed. 3. If you need a refill on your pain medication, please contact your pharmacy. They will contact our office to request authorization.  Prescriptions will not be filled after 5pm or on week-ends. 4. You should follow a light diet the first few days after arrival home, such as soup and crackers, pudding, etc.unless your doctor has advised otherwise. A high-fiber, low fat diet can be resumed as tolerated.   Be sure to include lots of fluids daily. Most patients will experience some swelling and bruising on the chest and neck area.  Ice packs will help.  Swelling and bruising can take several days to resolve 5. Most patients will experience some swelling and bruising in the area of the incision. Ice pack will help. Swelling and bruising can take several days to resolve..  6. It is common to experience some constipation if taking pain medication after surgery.  Increasing fluid intake and taking a stool softener will usually help or prevent this problem from occurring.  A mild laxative (Milk of Magnesia or Miralax) should be taken according to package directions if there are no bowel movements after 48 hours. 7.  You may have steri-strips (small skin tapes) in place directly over the incision.  These strips should be left on the skin for 7-10 days.  If your  surgeon used skin glue on the incision, you may shower in 24 hours.  The glue will flake off over the next 2-3 weeks.  Any sutures or staples will be removed at the office during your follow-up visit. You may find that a light gauze bandage over your incision may keep your staples from being rubbed or pulled. You may shower and replace the bandage daily. 8. ACTIVITIES:  You may resume regular (light) daily activities beginning the next day--such as daily self-care, walking, climbing stairs--gradually increasing activities as tolerated.  You may have sexual intercourse when it is comfortable.  Refrain from any heavy lifting or straining until approved by your doctor. a. You may drive when you no longer are taking prescription pain medication, you can comfortably wear a seatbelt, and you can safely maneuver your car and apply brakes b. Return to Work: ___________________________________ 9. You should see your doctor in the office for a follow-up appointment approximately two weeks after your surgery.  Make sure that you call for this appointment within a day or two after you arrive home to insure a convenient appointment time. OTHER INSTRUCTIONS:  _____________________________________________________________ _____________________________________________________________  WHEN TO CALL YOUR DOCTOR: 1. Fever over 101.0 2. Inability to urinate 3. Nausea and/or vomiting 4. Extreme swelling or bruising 5. Continued bleeding from incision. 6. Increased pain, redness, or drainage from the incision. 7. Difficulty swallowing or breathing 8. Muscle cramping or spasms. 9. Numbness or tingling in hands or feet or around lips.  The clinic staff is available to   answer your questions during regular business hours.  Please don't hesitate to call and ask to speak to one of the nurses if you have concerns.  For further questions, please visit [email or web address]   

## 2017-06-07 NOTE — Discharge Summary (Signed)
Physician Discharge Summary  Patient ID: Jasmin Stewart MRN: [id number] DOB/AGE: 1966-01-21 52 y.o.  Admit date: 06/05/2017 Discharge date: 06/07/2017  Admission Diagnoses:  Ventral incisional hernia  Discharge Diagnoses: same Active Problems:   Ventral incisional hernia   Discharged Condition: good  Hospital Course: Open ventral incisional hernia repair with mesh 06/05/17.  Pain control was an issue for the first day, but she improved to just using Toradol and Oxycodone.  Ambulating.  Tolerating diet.  Consults: None    Treatments: surgery: open ventral hernia repair with mesh  Discharge Exam: Blood pressure 103/62, pulse 67, temperature 98.7 F (37.1 C), temperature source Oral, resp. rate 18, SpO2 96 %. General appearance: alert, cooperative and no distress GI: soft, non-tender; bowel sounds normal; no masses,  no organomegaly Incision c/d/i; no sign of seroma or recurrence  Disposition: 01-Home or Self Care   Allergies as of 06/07/2017   No Known Allergies     Medication List    TAKE these medications   acetaminophen 500 MG tablet Commonly known as:  TYLENOL Take 1,000 mg by mouth every 6 (six) hours as needed for moderate pain or headache.   benzonatate 100 MG capsule Commonly known as:  TESSALON Take 1 capsule (100 mg total) by mouth 3 (three) times daily as needed for cough.   esomeprazole 40 MG capsule Commonly known as:  NEXIUM Take 1 capsule (40 mg total) by mouth daily at 12 noon. What changed:    when to take this  reasons to take this   fluticasone 50 MCG/ACT nasal spray Commonly known as:  FLONASE Place 2 sprays into both nostrils daily.   oxyCODONE 5 MG immediate release tablet Commonly known as:  Oxy IR/ROXICODONE Take 1 tablet (5 mg total) by mouth every 6 (six) hours as needed for moderate pain.      Follow-up Information    Donnie Mesa, MD Follow up in 3 week(s).   Specialty:  General Surgery Contact information: [address] [place] [address] [phone]           Signed: Maia Petties 06/07/2017, 10:16 AM

## 2017-06-07 NOTE — Progress Notes (Signed)
Pt was discharged to go home with her family.  Discharge instructions and prescriptions given.

## 2017-07-12 DIAGNOSIS — H5203 Hypermetropia, bilateral: Secondary | ICD-10-CM | POA: Diagnosis not present

## 2017-07-30 ENCOUNTER — Encounter: Payer: BLUE CROSS/BLUE SHIELD | Admitting: Gynecology

## 2017-08-17 ENCOUNTER — Encounter: Payer: Self-pay | Admitting: Medical

## 2017-08-17 ENCOUNTER — Ambulatory Visit: Payer: BLUE CROSS/BLUE SHIELD | Admitting: Medical

## 2017-08-17 ENCOUNTER — Encounter: Payer: BLUE CROSS/BLUE SHIELD | Admitting: Medical

## 2017-08-17 ENCOUNTER — Ambulatory Visit (INDEPENDENT_AMBULATORY_CARE_PROVIDER_SITE_OTHER): Payer: BLUE CROSS/BLUE SHIELD | Admitting: Medical

## 2017-08-17 VITALS — BP 113/63 | HR 79 | Temp 98.4°F | Resp 16 | Ht 66.0 in | Wt 247.0 lb

## 2017-08-17 DIAGNOSIS — Z0000 Encounter for general adult medical examination without abnormal findings: Secondary | ICD-10-CM

## 2017-08-17 NOTE — Patient Instructions (Addendum)
For you wellness exam today I have ordered cbc, cmp, lipid panel, and ua.(but future labs so get scheduled)  Vaccine up to date per epic review. No maintenance issues.  Recommend mild exercise and healthy diet.(don't stress surgical line too much)  We will let you know lab results as they come in.  Follow up date appointment will be determined after lab review.    Preventive Care 40-64 Years, Female Preventive care refers to lifestyle choices and visits with your health care provider that can promote health and wellness. What does preventive care include?  A yearly physical exam. This is also called an annual well check.  Dental exams once or twice a year.  Routine eye exams. Ask your health care provider how often you should have your eyes checked.  Personal lifestyle choices, including: ? Daily care of your teeth and gums. ? Regular physical activity. ? Eating a healthy diet. ? Avoiding tobacco and drug use. ? Limiting alcohol use. ? Practicing safe sex. ? Taking low-dose aspirin daily starting at age 27. ? Taking vitamin and mineral supplements as recommended by your health care provider. What happens during an annual well check? The services and screenings done by your health care provider during your annual well check will depend on your age, overall health, lifestyle risk factors, and family history of disease. Counseling Your health care provider may ask you questions about your:  Alcohol use.  Tobacco use.  Drug use.  Emotional well-being.  Home and relationship well-being.  Sexual activity.  Eating habits.  Work and work Statistician.  Method of birth control.  Menstrual cycle.  Pregnancy history.  Screening You may have the following tests or measurements:  Height, weight, and BMI.  Blood pressure.  Lipid and cholesterol levels. These may be checked every 5 years, or more frequently if you are over 50 years old.  Skin check.  Lung cancer  screening. You may have this screening every year starting at age 15 if you have a 30-pack-year history of smoking and currently smoke or have quit within the past 15 years.  Fecal occult blood test (FOBT) of the stool. You may have this test every year starting at age 68.  Flexible sigmoidoscopy or colonoscopy. You may have a sigmoidoscopy every 5 years or a colonoscopy every 10 years starting at age 63.  Hepatitis C blood test.  Hepatitis B blood test.  Sexually transmitted disease (STD) testing.  Diabetes screening. This is done by checking your blood sugar (glucose) after you have not eaten for a while (fasting). You may have this done every 1-3 years.  Mammogram. This may be done every 1-2 years. Talk to your health care provider about when you should start having regular mammograms. This may depend on whether you have a family history of breast cancer.  BRCA-related cancer screening. This may be done if you have a family history of breast, ovarian, tubal, or peritoneal cancers.  Pelvic exam and Pap test. This may be done every 3 years starting at age 29. Starting at age 63, this may be done every 5 years if you have a Pap test in combination with an HPV test.  Bone density scan. This is done to screen for osteoporosis. You may have this scan if you are at high risk for osteoporosis.  Discuss your test results, treatment options, and if necessary, the need for more tests with your health care provider. Vaccines Your health care provider may recommend certain vaccines, such as:  Influenza  vaccine. This is recommended every year.  Tetanus, diphtheria, and acellular pertussis (Tdap, Td) vaccine. You may need a Td booster every 10 years.  Varicella vaccine. You may need this if you have not been vaccinated.  Zoster vaccine. You may need this after age 42.  Measles, mumps, and rubella (MMR) vaccine. You may need at least one dose of MMR if you were born in 1957 or later. You may  also need a second dose.  Pneumococcal 13-valent conjugate (PCV13) vaccine. You may need this if you have certain conditions and were not previously vaccinated.  Pneumococcal polysaccharide (PPSV23) vaccine. You may need one or two doses if you smoke cigarettes or if you have certain conditions.  Meningococcal vaccine. You may need this if you have certain conditions.  Hepatitis A vaccine. You may need this if you have certain conditions or if you travel or work in places where you may be exposed to hepatitis A.  Hepatitis B vaccine. You may need this if you have certain conditions or if you travel or work in places where you may be exposed to hepatitis B.  Haemophilus influenzae type b (Hib) vaccine. You may need this if you have certain conditions.  Talk to your health care provider about which screenings and vaccines you need and how often you need them. This information is not intended to replace advice given to you by your health care provider. Make sure you discuss any questions you have with your health care provider. Document Released: 04/23/2015 Document Revised: 12/15/2015 Document Reviewed: 01/26/2015 Elsevier Interactive Patient Education  [employer].

## 2017-08-17 NOTE — Progress Notes (Signed)
Subjective:    Patient ID: Jasmin Stewart, female    DOB: April 09, 1966, 52 y.o.   MRN: [id number]  HPI  Pt in for exam.  Pt had long recovery from her surgery for hernia. She was released to work. Surgeon cleared her for return to work.  Pt  Is ok getting CPE today. She expresses needs one.  Per epic she up to date on health maintenance.  Pt sees gyn. Up to date on mammogram.   Review of Systems  Constitutional: Negative for chills, diaphoresis, fatigue and fever.  HENT: Negative for congestion, drooling, ear pain, hearing loss and mouth sores.   Respiratory: Negative for cough, chest tightness, shortness of breath and wheezing.   Cardiovascular: Negative for chest pain and palpitations.  Gastrointestinal: Negative for abdominal pain.  Genitourinary: Negative for difficulty urinating, dyspareunia, flank pain, frequency and genital sores.  Musculoskeletal: Negative for back pain.  Skin: Negative for rash.  Neurological: Negative for dizziness, syncope, facial asymmetry, speech difficulty, weakness and light-headedness.       Come and go light headed. None presently. Last time early today working looking at computer. Very quickly.  Hematological: Negative for adenopathy. Does not bruise/bleed easily.  Psychiatric/Behavioral: Negative for behavioral problems, confusion, hallucinations, sleep disturbance and suicidal ideas. The patient is not nervous/anxious.     Past Medical History:  Diagnosis Date  . Anemia   . Asthma   . Fibroid, uterine    never had surgery  . History of blood transfusion 1991; 1995   related to childbirth  . History of chicken pox    Age 63  . Hypercholesteremia   . Hypoglycemia   . Hypokalemia   . Pneumonia 2013,2014, 2018  . PVC (premature ventricular contraction)   . Ulcer of esophagus      Social History   Socioeconomic History  . Marital status: Single    Spouse name: Not on file  . Number of children: Not on file  . Years of education: Not  on file  . Highest education level: Not on file  Occupational History  . Not on file  Social Needs  . Financial resource strain: Not on file  . Food insecurity:    Worry: Not on file    Inability: Not on file  . Transportation needs:    Medical: Not on file    Non-medical: Not on file  Tobacco Use  . Smoking status: Current Every Day Smoker    Packs/day: 0.40    Years: 36.00    Pack years: 14.40    Types: Cigarettes  . Smokeless tobacco: Never Used  Substance and Sexual Activity  . Alcohol use: Yes    Alcohol/week: 0.0 oz    Comment: 06/05/2017 "might have a couple drinks/month; if that"  . Drug use: No  . Sexual activity: Yes    Birth control/protection: None    Comment: 1st intercourse 52 yo-More than 5 partners  Lifestyle  . Physical activity:    Days per week: Not on file    Minutes per session: Not on file  . Stress: Not on file  Relationships  . Social connections:    Talks on phone: Not on file    Gets together: Not on file    Attends religious service: Not on file    Active member of club or organization: Not on file    Attends meetings of clubs or organizations: Not on file    Relationship status: Not on file  . Intimate partner  violence:    Fear of current or ex partner: Not on file    Emotionally abused: Not on file    Physically abused: Not on file    Forced sexual activity: Not on file  Other Topics Concern  . Not on file  Social History Narrative  . Not on file    Past Surgical History:  Procedure Laterality Date  . ABDOMINAL HERNIA REPAIR  2007; 06/05/2017   Suprapubic, Surgery 2007 with mesh VHR w/mesh; OPEN VENTRAL HERNIA REPAIR WITH MESH  . [place]; 1995; 1998  . ESOPHAGOGASTRODUODENOSCOPY  01/2017  . HERNIA REPAIR  2007; 06/05/2017  . INSERTION OF MESH N/A 06/05/2017   Procedure: INSERTION OF MESH;  Surgeon: Donnie Mesa, MD;  Location: [facility];  Service: General;  Laterality: N/A;  . VENTRAL HERNIA REPAIR N/A 06/05/2017    Procedure: OPEN VENTRAL HERNIA REPAIR WITH MESH;  Surgeon: Donnie Mesa, MD;  Location: [facility];  Service: General;  Laterality: N/A;    Family History  Problem Relation Age of Onset  . Thyroid disease Mother   . Atrial fibrillation Mother   . Hyperlipidemia Mother   . Hyperlipidemia Sister   . Hyperlipidemia Brother   . Thyroid disease Maternal Grandmother   . Glaucoma Maternal Grandmother   . Cataracts Maternal Grandmother   . Esophageal cancer Maternal Grandfather   . Hypertension Maternal Uncle   . Obesity Maternal Uncle   . Allergies Son        Hay Fever  . Healthy Daughter     No Known Allergies  Current Outpatient Medications on File Prior to Visit  Medication Sig Dispense Refill  . acetaminophen (TYLENOL) 500 MG tablet Take 1,000 mg by mouth every 6 (six) hours as needed for moderate pain or headache.    . esomeprazole (NEXIUM) 40 MG capsule Take 1 capsule (40 mg total) by mouth daily at 12 noon. (Patient taking differently: Take 40 mg by mouth daily as needed (for ulcer flare up). ) 30 capsule 5  . fluticasone (FLONASE) 50 MCG/ACT nasal spray Place 2 sprays into both nostrils daily. 16 g 1  . oxyCODONE (OXY IR/ROXICODONE) 5 MG immediate release tablet Take 1 tablet (5 mg total) by mouth every 6 (six) hours as needed for moderate pain. 30 tablet 0   Current Facility-Administered Medications on File Prior to Visit  Medication Dose Route Frequency Provider Last Rate Last Dose  . 0.9 %  sodium chloride infusion  500 mL Intravenous Continuous Pyrtle, Lajuan Lines, MD        BP 113/63   Pulse 79   Temp 98.4 F (36.9 C) (Oral)   Resp 16   Ht 5\' 6"  (1.676 m)   Wt 247 lb (112 kg)   SpO2 98%   BMI 39.87 kg/m       Objective:   Physical Exam  General Mental Status- Alert. General Appearance- Not in acute distress.   Skin General: Color- Normal Color. Moisture- Normal Moisture. No worrisome lesions.  Neck Carotid Arteries- Normal color. Moisture- Normal Moisture. No  carotid bruits. No JVD.  Chest and Lung Exam Auscultation: Breath Sounds:-Normal.  Cardiovascular Auscultation:Rythm- Regular. Murmurs & Other Heart Sounds:Auscultation of the heart reveals- No Murmurs.  Abdomen Inspection:-Inspeection Normal. Palpation/Percussion:Note:No mass. Palpation and Percussion of the abdomen reveal- Non Tender, Non Distended + BS, no rebound or guarding.    Neurologic Cranial Nerve exam:- CN III-XII intact(No nystagmus), symmetric smile. Strength:- 5/5 equal and symmetric strength both upper and lower extremities.  Assessment & Plan:  For you wellness exam today I have ordered cbc, cmp, lipid panel, and ua.  Vaccine up to date per epic review. No maintenance issues.  Recommend mild exercise and healthy diet.(don't stress surgical line too much)  We will let you know lab results as they come in.  Follow up date appointment will be determined after lab review.  Regarding patient's intermittent lightheaded sensation.  She does not have that presently.  No neurologic findings today on exam.  Will follow her labs see if any significant abnormalities that might contribute to lightheaded sensation.  Also on lab result note call will touch base on her lightheaded sensation/see if it is persisting/recurrent.  Mackie Pai, PA-C

## 2017-09-12 ENCOUNTER — Encounter: Payer: Self-pay | Admitting: Medical

## 2017-09-12 ENCOUNTER — Telehealth: Payer: Self-pay | Admitting: Medical

## 2017-09-12 ENCOUNTER — Other Ambulatory Visit (INDEPENDENT_AMBULATORY_CARE_PROVIDER_SITE_OTHER): Payer: BLUE CROSS/BLUE SHIELD

## 2017-09-12 DIAGNOSIS — Z0000 Encounter for general adult medical examination without abnormal findings: Secondary | ICD-10-CM

## 2017-09-12 LAB — CBC WITH DIFFERENTIAL/PLATELET
BASOS ABS: 0.1 10*3/uL (ref 0.0–0.1)
Basophils Relative: 0.7 % (ref 0.0–3.0)
EOS ABS: 0.3 10*3/uL (ref 0.0–0.7)
Eosinophils Relative: 3.1 % (ref 0.0–5.0)
HCT: 40 % (ref 36.0–46.0)
Hemoglobin: 13.2 g/dL (ref 12.0–15.0)
LYMPHS ABS: 3.3 10*3/uL (ref 0.7–4.0)
Lymphocytes Relative: 36.5 % (ref 12.0–46.0)
MCHC: 33.1 g/dL (ref 30.0–36.0)
MCV: 88.7 fl (ref 78.0–100.0)
MONOS PCT: 7.1 % (ref 3.0–12.0)
Monocytes Absolute: 0.6 10*3/uL (ref 0.1–1.0)
NEUTROS PCT: 52.6 % (ref 43.0–77.0)
Neutro Abs: 4.7 10*3/uL (ref 1.4–7.7)
Platelets: 441 10*3/uL — ABNORMAL HIGH (ref 150.0–400.0)
RBC: 4.51 Mil/uL (ref 3.87–5.11)
RDW: 15.5 % (ref 11.5–15.5)
WBC: 9 10*3/uL (ref 4.0–10.5)

## 2017-09-12 LAB — COMPREHENSIVE METABOLIC PANEL
ALK PHOS: 93 U/L (ref 39–117)
ALT: 9 U/L (ref 0–35)
AST: 10 U/L (ref 0–37)
Albumin: 4.1 g/dL (ref 3.5–5.2)
BILIRUBIN TOTAL: 0.3 mg/dL (ref 0.2–1.2)
BUN: 17 mg/dL (ref 6–23)
CALCIUM: 10.2 mg/dL (ref 8.4–10.5)
CO2: 31 mEq/L (ref 19–32)
Chloride: 105 mEq/L (ref 96–112)
Creatinine, Ser: 0.64 mg/dL (ref 0.40–1.20)
GFR: 126.32 mL/min (ref >60.00)
GLUCOSE: 89 mg/dL (ref 70–99)
Potassium: 5.3 mEq/L — ABNORMAL HIGH (ref 3.5–5.1)
Sodium: 141 mEq/L (ref 135–145)
TOTAL PROTEIN: 6.8 g/dL (ref 6.0–8.3)

## 2017-09-12 LAB — LIPID PANEL
Cholesterol: 237 mg/dL — ABNORMAL HIGH (ref 0–200)
HDL: 40.1 mg/dL (ref >39.00)
LDL Cholesterol: 168 mg/dL — ABNORMAL HIGH (ref 0–99)
NonHDL: 197.2
TRIGLYCERIDES: 147 mg/dL (ref 0.0–149.0)
Total CHOL/HDL Ratio: 6
VLDL: 29.4 mg/dL (ref 0.0–40.0)

## 2017-09-12 LAB — URINALYSIS, ROUTINE W REFLEX MICROSCOPIC
BILIRUBIN URINE: NEGATIVE
Hgb urine dipstick: NEGATIVE
Ketones, ur: NEGATIVE
LEUKOCYTES UA: NEGATIVE
Nitrite: NEGATIVE
RBC / HPF: NONE SEEN (ref 0–?)
Specific Gravity, Urine: 1.025 (ref 1.000–1.030)
Total Protein, Urine: NEGATIVE
Urine Glucose: NEGATIVE
Urobilinogen, UA: 0.2 (ref 0.0–1.0)
pH: 6 (ref 5.0–8.0)

## 2017-09-12 MED ORDER — ATORVASTATIN CALCIUM 10 MG PO TABS: 10.0000 mg | ORAL_TABLET | Freq: Every day | ORAL | 3 refills | Status: DC

## 2017-09-12 NOTE — Telephone Encounter (Signed)
Rx lipitor sent to pharmacy. 

## 2017-11-05 ENCOUNTER — Encounter: Payer: Self-pay | Admitting: Medical

## 2017-11-12 ENCOUNTER — Encounter: Payer: Self-pay | Admitting: Gynecology

## 2017-11-12 ENCOUNTER — Ambulatory Visit (INDEPENDENT_AMBULATORY_CARE_PROVIDER_SITE_OTHER): Payer: BLUE CROSS/BLUE SHIELD | Admitting: Gynecology

## 2017-11-12 ENCOUNTER — Encounter: Payer: Self-pay | Admitting: Medical

## 2017-11-12 VITALS — BP 124/82 | Ht 66.5 in | Wt 253.0 lb

## 2017-11-12 DIAGNOSIS — Z01419 Encounter for gynecological examination (general) (routine) without abnormal findings: Secondary | ICD-10-CM | POA: Diagnosis not present

## 2017-11-12 DIAGNOSIS — N951 Menopausal and female climacteric states: Secondary | ICD-10-CM | POA: Diagnosis not present

## 2017-11-12 NOTE — Patient Instructions (Signed)
Schedule your colonoscopy with either:  [facility]   Address: [address], [place], [place] [address]  [phone]    or  [facility]  Address: [address], [place], [place] [address]  [phone]      

## 2017-11-12 NOTE — Progress Notes (Signed)
    Jasmin Stewart [birth year] [id number]        52 y.o.  [id number] for annual gynecologic exam.  Last menstrual period January 2019.  Having some hot flushes and sweats but not overly bothersome.  Past medical history,surgical history, problem list, medications, allergies, family history and social history were all reviewed and documented as reviewed in the EPIC chart.  ROS:  Performed with pertinent positives and negatives included in the history, assessment and plan.   Additional significant findings : None   Exam: Jasmin Stewart assistant Vitals:   11/12/17 1555  BP: 124/82  Weight: 253 lb (114.8 kg)  Height: 5' 6.5" (1.689 m)   Body mass index is 40.22 kg/m.  General appearance:  Normal affect, orientation and appearance. Skin: Grossly normal HEENT: Without gross lesions.  No cervical or supraclavicular adenopathy. Thyroid normal.  Lungs:  Clear without wheezing, rales or rhonchi Cardiac: RR, without RMG Abdominal:  Soft, nontender, without masses, guarding, rebound, organomegaly or hernia Breasts:  Examined lying and sitting without masses, retractions, discharge or axillary adenopathy. Pelvic:  Ext, BUS, Vagina: Normal  Cervix: Normal  Uterus: Anteverted, normal size, shape and contour, midline and mobile nontender   Adnexa: Without masses or tenderness    Anus and perineum: Normal   Rectovaginal: Normal sphincter tone without palpated masses or tenderness.    Assessment/Plan:  52 y.o. [id number] female for annual gynecologic exam.   1. Perimenopause.  Last menstrual period January.  Some mild menopausal symptoms.  We discussed the perimenopause and what to expect.  Options for HRT versus observation.  The issue of contraception has been discussed.  At this point patient is going to follow on as long as no prolonged or atypical bleeding or does not go more than 1 year and then has bleeding will monitor.  If significant symptoms present themselves then she will follow-up for HRT  discussion. 2. Pap smear 2018.  No Pap smear done today.  Plan repeat Pap smear at 3-year interval per current screening guidelines. 3. Colonoscopy not yet.  Names and numbers provided. 4. Mammography coming due in September and I reminded patient to schedule.  Breast exam normal today. 5. Health maintenance.  She had an incisional hernia repair this past year with mesh.  Has done well from this.  We discussed weight loss strategies with recommendation that she pursue weight watchers.  No routine blood work done as this is done elsewhere.  Follow-up in 1 year, sooner as needed.   Anastasio Auerbach MD, 4:41 PM 11/12/2017

## 2017-11-14 ENCOUNTER — Other Ambulatory Visit: Payer: Self-pay | Admitting: Family Medicine

## 2017-12-10 ENCOUNTER — Encounter: Payer: Self-pay | Admitting: Medical

## 2017-12-11 ENCOUNTER — Ambulatory Visit: Payer: BLUE CROSS/BLUE SHIELD | Admitting: Registered"

## 2017-12-12 ENCOUNTER — Ambulatory Visit: Payer: BLUE CROSS/BLUE SHIELD | Admitting: Medical

## 2017-12-12 ENCOUNTER — Encounter: Payer: Self-pay | Admitting: Medical

## 2017-12-12 VITALS — BP 117/70 | HR 98 | Temp 97.7°F | Resp 16 | Ht 66.5 in | Wt 251.8 lb

## 2017-12-12 DIAGNOSIS — J40 Bronchitis, not specified as acute or chronic: Secondary | ICD-10-CM | POA: Diagnosis not present

## 2017-12-12 DIAGNOSIS — R059 Cough, unspecified: Secondary | ICD-10-CM

## 2017-12-12 DIAGNOSIS — R05 Cough: Secondary | ICD-10-CM

## 2017-12-12 DIAGNOSIS — R062 Wheezing: Secondary | ICD-10-CM

## 2017-12-12 MED ORDER — BENZONATATE 100 MG PO CAPS: ORAL_CAPSULE | ORAL | 0 refills | Status: DC

## 2017-12-12 MED ORDER — DOXYCYCLINE HYCLATE 100 MG PO TABS: 100.0000 mg | ORAL_TABLET | Freq: Two times a day (BID) | ORAL | 0 refills | Status: DC

## 2017-12-12 MED ORDER — FLUTICASONE PROPIONATE HFA 110 MCG/ACT IN AERO: INHALATION_SPRAY | RESPIRATORY_TRACT | 12 refills | Status: DC

## 2017-12-12 MED ORDER — ALBUTEROL SULFATE HFA 108 (90 BASE) MCG/ACT IN AERS: 2.0000 | INHALATION_SPRAY | Freq: Four times a day (QID) | RESPIRATORY_TRACT | 2 refills | Status: DC | PRN

## 2017-12-12 MED ORDER — FLUTICASONE PROPIONATE 50 MCG/ACT NA SUSP: 2.0000 | Freq: Every day | NASAL | 1 refills | Status: DC

## 2017-12-12 NOTE — Patient Instructions (Addendum)
You appear to have bronchitis(with possible sinus infection). Rest hydrate and tylenol for fever. I am prescribing cough medicine benzonatate, and doxycycline antibiotic. For your nasal congestion rx flonase.  Will get cxr today or tomorrow if they are closed.  For early wheezing rx flovent and albuterol. Use as directed. If you worsen then will need to rx oral prednisone. Can my chart me if needed.  Follow up in 7-10 days or as needed

## 2017-12-12 NOTE — Progress Notes (Signed)
Subjective:    Patient ID: Jasmin Stewart, female    DOB: 09/16/1965, 52 y.o.   MRN: [id number]  HPI  Pt in with some nasal congestion, chest congestion and faint sore throat. Pt is beginning to get wheezing but very minimal at this point.   Pt had one severe episode of bronchitis with subsequent wheezing about 1.5 years ago.   Pt last episode was prolonged and she had to use prednisone. But she prefers not to use if possible due to retention of fluid side effect.    Review of Systems  Constitutional: Negative for chills, fatigue and fever.  HENT: Positive for congestion, sinus pressure and sinus pain.   Respiratory: Positive for cough and wheezing. Negative for chest tightness and shortness of breath.   Cardiovascular: Negative for chest pain and palpitations.  Gastrointestinal: Negative for abdominal pain.  Musculoskeletal: Negative for back pain, myalgias and neck pain.  Skin: Negative for rash.  Neurological: Negative for facial asymmetry and light-headedness.  Hematological: Negative for adenopathy. Does not bruise/bleed easily.  Psychiatric/Behavioral: Negative for behavioral problems and confusion.   Past Medical History:  Diagnosis Date  . Anemia   . Asthma   . Fibroid, uterine    never had surgery  . History of blood transfusion 1991; 1995   related to childbirth  . History of chicken pox    Age 15  . Hypercholesteremia   . Hypoglycemia   . Hypokalemia   . Pneumonia 2013,2014, 2018  . PVC (premature ventricular contraction)   . Ulcer of esophagus      Social History   Socioeconomic History  . Marital status: Single    Spouse name: Not on file  . Number of children: Not on file  . Years of education: Not on file  . Highest education level: Not on file  Occupational History  . Not on file  Social Needs  . Financial resource strain: Not on file  . Food insecurity:    Worry: Not on file    Inability: Not on file  . Transportation needs:    Medical: Not  on file    Non-medical: Not on file  Tobacco Use  . Smoking status: Current Every Day Smoker    Packs/day: 0.40    Years: 36.00    Pack years: 14.40    Types: Cigarettes  . Smokeless tobacco: Never Used  Substance and Sexual Activity  . Alcohol use: Not Currently    Alcohol/week: 0.0 standard drinks  . Drug use: No  . Sexual activity: Yes    Birth control/protection: Post-menopausal    Comment: 1st intercourse 52 yo-More than 5 partners  Lifestyle  . Physical activity:    Days per week: Not on file    Minutes per session: Not on file  . Stress: Not on file  Relationships  . Social connections:    Talks on phone: Not on file    Gets together: Not on file    Attends religious service: Not on file    Active member of club or organization: Not on file    Attends meetings of clubs or organizations: Not on file    Relationship status: Not on file  . Intimate partner violence:    Fear of current or ex partner: Not on file    Emotionally abused: Not on file    Physically abused: Not on file    Forced sexual activity: Not on file  Other Topics Concern  . Not on file  Social History Narrative  . Not on file    Past Surgical History:  Procedure Laterality Date  . ABDOMINAL HERNIA REPAIR  2007; 06/05/2017   Suprapubic, Surgery 2007 with mesh VHR w/mesh; OPEN VENTRAL HERNIA REPAIR WITH MESH  . [place]; 1995; 1998  . ESOPHAGOGASTRODUODENOSCOPY  01/2017  . HERNIA REPAIR  2007; 06/05/2017  . INSERTION OF MESH N/A 06/05/2017   Procedure: INSERTION OF MESH;  Surgeon: Donnie Mesa, MD;  Location: [facility];  Service: General;  Laterality: N/A;  . VENTRAL HERNIA REPAIR N/A 06/05/2017   Procedure: OPEN VENTRAL HERNIA REPAIR WITH MESH;  Surgeon: Donnie Mesa, MD;  Location: [facility];  Service: General;  Laterality: N/A;    Family History  Problem Relation Age of Onset  . Thyroid disease Mother   . Atrial fibrillation Mother   . Hyperlipidemia Mother   . Hyperlipidemia  Sister   . Hyperlipidemia Brother   . Thyroid disease Maternal Grandmother   . Glaucoma Maternal Grandmother   . Cataracts Maternal Grandmother   . Esophageal cancer Maternal Grandfather   . Hypertension Maternal Uncle   . Obesity Maternal Uncle   . Allergies Son        Hay Fever  . Healthy Daughter     No Known Allergies  Current Outpatient Medications on File Prior to Visit  Medication Sig Dispense Refill  . acetaminophen (TYLENOL) 500 MG tablet Take 1,000 mg by mouth every 6 (six) hours as needed for moderate pain or headache.    . esomeprazole (NEXIUM) 40 MG capsule Take 1 capsule (40 mg total) by mouth daily at 12 noon. (Patient taking differently: Take 40 mg by mouth daily as needed (for ulcer flare up). ) 30 capsule 5   No current facility-administered medications on file prior to visit.     BP 117/70   Pulse 98   Temp 97.7 F (36.5 C) (Oral)   Resp 16   Ht 5' 6.5" (1.689 m)   Wt 251 lb 12.8 oz (114.2 kg)   LMP 04/29/2017 Comment: patient has some spotting last spotting was in January   SpO2 98%   BMI 40.03 kg/m      Objective:   Physical Exam  General  Mental Status - Alert. General Appearance - Well groomed. Not in acute distress.  Skin Rashes- No Rashes.  HEENT Head- Normal. Ear Auditory Canal - Left- Normal. Right - Normal.Tympanic Membrane- Left- Normal. Right- Normal. Eye Sclera/Conjunctiva- Left- Normal. Right- Normal. Nose & Sinuses Nasal Mucosa- Left-  Boggy and Congested. Right-  Boggy and  Congested.Rt side maxillary sinus pressure but no  frontal sinus pressure. Mouth & Throat Lips: Upper Lip- Normal: no dryness, cracking, pallor, cyanosis, or vesicular eruption. Lower Lip-Normal: no dryness, cracking, pallor, cyanosis or vesicular eruption. Buccal Mucosa- Bilateral- No Aphthous ulcers. Oropharynx- No Discharge or Erythema. Tonsils: Characteristics- Bilateral- No Erythema or Congestion. Size/Enlargement- Bilateral- No enlargement. Discharge-  bilateral-None.  Neck Neck- Supple. No Masses.   Chest and Lung Exam Auscultation: Breath Sounds:-even and unlabored. Faint upper lobe rhonchi. Cardiovascular Auscultation:Rythm- Regular, rate and rhythm. Murmurs & Other Heart Sounds:Ausculatation of the heart reveal- No Murmurs.  Lymphatic Head & Neck General Head & Neck Lymphatics: Bilateral: Description- No Localized lymphadenopathy.      Assessment & Plan:  You appear to have bronchitis(with possible sinus infection). Rest hydrate and tylenol for fever. I am prescribing cough medicine benzonatate, and doxycycline antibiotic. For your nasal congestion rx flonase.  Will get cxr today or tomorrow if  they are closed.  For early wheezing rx flovent and albuterol. Use as directed. If you worsen then will need to rx oral prednisone.  Follow up in 7-10 days or as needed  [employer], [employer]

## 2017-12-13 ENCOUNTER — Ambulatory Visit: Payer: BLUE CROSS/BLUE SHIELD | Admitting: Skilled Nursing Facility1

## 2017-12-25 DIAGNOSIS — Z1231 Encounter for screening mammogram for malignant neoplasm of breast: Secondary | ICD-10-CM | POA: Diagnosis not present

## 2018-01-17 ENCOUNTER — Encounter: Payer: Self-pay | Admitting: Medical

## 2018-03-01 ENCOUNTER — Emergency Department (HOSPITAL_BASED_OUTPATIENT_CLINIC_OR_DEPARTMENT_OTHER): Payer: BLUE CROSS/BLUE SHIELD

## 2018-03-01 ENCOUNTER — Emergency Department (HOSPITAL_BASED_OUTPATIENT_CLINIC_OR_DEPARTMENT_OTHER)
Admission: EM | Admit: 2018-03-01 | Discharge: 2018-03-01 | Disposition: A | Discharge status: Home / Self Care | Payer: BLUE CROSS/BLUE SHIELD | Attending: Emergency Medicine | Admitting: Emergency Medicine

## 2018-03-01 ENCOUNTER — Other Ambulatory Visit: Payer: Self-pay

## 2018-03-01 ENCOUNTER — Encounter (HOSPITAL_BASED_OUTPATIENT_CLINIC_OR_DEPARTMENT_OTHER): Payer: Self-pay | Admitting: *Deleted

## 2018-03-01 DIAGNOSIS — J45909 Unspecified asthma, uncomplicated: Secondary | ICD-10-CM | POA: Insufficient documentation

## 2018-03-01 DIAGNOSIS — F17210 Nicotine dependence, cigarettes, uncomplicated: Secondary | ICD-10-CM | POA: Diagnosis not present

## 2018-03-01 DIAGNOSIS — M25511 Pain in right shoulder: Secondary | ICD-10-CM | POA: Insufficient documentation

## 2018-03-01 DIAGNOSIS — Z79899 Other long term (current) drug therapy: Secondary | ICD-10-CM | POA: Diagnosis not present

## 2018-03-01 MED ORDER — HYDROCODONE-ACETAMINOPHEN 5-325 MG PO TABS: 1.0000 | ORAL_TABLET | Freq: Four times a day (QID) | ORAL | 0 refills | Status: DC | PRN

## 2018-03-01 MED ORDER — PREDNISONE 20 MG PO TABS: 40.0000 mg | ORAL_TABLET | Freq: Every day | ORAL | 0 refills | Status: DC

## 2018-03-01 NOTE — ED Provider Notes (Signed)
[facility] EMERGENCY DEPARTMENT Provider Note   CSN: [id number] Arrival date & time: 03/01/18  1438     History   Chief Complaint Chief Complaint  Patient presents with  . Shoulder Injury    HPI Jasmin Stewart is a 52 y.o. female.  HPI Patient presents with right shoulder pain.  States that on Monday with today being Friday she reached up for a water bottle and had pain in her right lateral arm.  Since then the pain is moved up.  Now severe pain with moving the shoulder.  No numbness or weakness.  Pain is in the back and lateral aspect of the shoulder.  No fevers.  No numbness or weakness.  No relief with ibuprofen. Past Medical History:  Diagnosis Date  . Anemia   . Asthma   . Fibroid, uterine    never had surgery  . History of blood transfusion 1991; 1995   related to childbirth  . History of chicken pox    Age 59  . Hypercholesteremia   . Hypoglycemia   . Hypokalemia   . Pneumonia 2013,2014, 2018  . PVC (premature ventricular contraction)   . Ulcer of esophagus     Patient Active Problem List   Diagnosis Date Noted  . Ventral incisional hernia 06/05/2017  . PVC's (premature ventricular contractions) 06/09/2016    Past Surgical History:  Procedure Laterality Date  . ABDOMINAL HERNIA REPAIR  2007; 06/05/2017   Suprapubic, Surgery 2007 with mesh VHR w/mesh; OPEN VENTRAL HERNIA REPAIR WITH MESH  . [place]; 1995; 1998  . ESOPHAGOGASTRODUODENOSCOPY  01/2017  . HERNIA REPAIR  2007; 06/05/2017  . INSERTION OF MESH N/A 06/05/2017   Procedure: INSERTION OF MESH;  Surgeon: Donnie Mesa, MD;  Location: [facility];  Service: General;  Laterality: N/A;  . VENTRAL HERNIA REPAIR N/A 06/05/2017   Procedure: OPEN VENTRAL HERNIA REPAIR WITH MESH;  Surgeon: Donnie Mesa, MD;  Location: [facility];  Service: General;  Laterality: N/A;     OB History    Gravida  7   Para  4   Term      Preterm      AB  3   Living  4     SAB      TAB      Ectopic      Multiple      Live Births               Home Medications    Prior to Admission medications   Medication Sig Start Date End Date Taking? Authorizing Provider  acetaminophen (TYLENOL) 500 MG tablet Take 1,000 mg by mouth every 6 (six) hours as needed for moderate pain or headache.    [provider]  albuterol (PROVENTIL HFA;VENTOLIN HFA) 108 (90 Base) MCG/ACT inhaler Inhale 2 puffs into the lungs every 6 (six) hours as needed for wheezing or shortness of breath. 12/12/17   Saguier, Percell Miller, PA-C  benzonatate (TESSALON) 100 MG capsule 1-2 tab po q 8 hours as needed coughing 12/12/17   Saguier, Percell Miller, PA-C  doxycycline (VIBRA-TABS) 100 MG tablet Take 1 tablet (100 mg total) by mouth 2 (two) times daily. Can give caps or generic 12/12/17   Saguier, Percell Miller, PA-C  esomeprazole (NEXIUM) 40 MG capsule Take 1 capsule (40 mg total) by mouth daily at 12 noon. Patient taking differently: Take 40 mg by mouth daily as needed (for ulcer flare up).  09/20/16   Levin Erp, PA  fluticasone (  FLONASE) 50 MCG/ACT nasal spray Place 2 sprays into both nostrils daily. 12/12/17   Saguier, Percell Miller, PA-C  fluticasone [facility] HFA) 110 MCG/ACT inhaler 2 puffs po bid 12/12/17   Saguier, Percell Miller, PA-C  HYDROcodone-acetaminophen (NORCO/VICODIN) 5-325 MG tablet Take 1 tablet by mouth every 6 (six) hours as needed for moderate pain. 03/01/18   Davonna Belling, MD  predniSONE (DELTASONE) 20 MG tablet Take 2 tablets (40 mg total) by mouth daily. 03/01/18   Davonna Belling, MD    Family History Family History  Problem Relation Age of Onset  . Thyroid disease Mother   . Atrial fibrillation Mother   . Hyperlipidemia Mother   . Hyperlipidemia Sister   . Hyperlipidemia Brother   . Thyroid disease Maternal Grandmother   . Glaucoma Maternal Grandmother   . Cataracts Maternal Grandmother   . Esophageal cancer Maternal Grandfather   . Hypertension Maternal Uncle   . Obesity Maternal Uncle   .  Allergies Son        Hay Fever  . Healthy Daughter     Social History Social History   Tobacco Use  . Smoking status: Current Every Day Smoker    Packs/day: 0.40    Years: 36.00    Pack years: 14.40    Types: Cigarettes  . Smokeless tobacco: Never Used  Substance Use Topics  . Alcohol use: Not Currently    Alcohol/week: 0.0 standard drinks  . Drug use: No     Allergies   Patient has no known allergies.   Review of Systems Review of Systems  Constitutional: Negative for appetite change and fever.  Musculoskeletal: Negative for neck pain.       Right shoulder pain.  Neurological: Negative for weakness and numbness.     Physical Exam Updated Vital Signs BP 124/84   Pulse 86   Temp 98.7 F (37.1 C)   Resp 18   Ht 5\' 6"  (1.676 m)   Wt 111.1 kg   LMP 04/29/2017 Comment: patient has some spotting last spotting was in January   SpO2 93%   BMI 39.54 kg/m   Physical Exam  Constitutional: She appears well-developed.  HENT:  Head: Normocephalic.  Musculoskeletal: She exhibits tenderness. She exhibits no edema.  Tenderness to right shoulder laterally and posteriorly.  Decreased range of motion due to pain.  Neurovascular intact in right hand.  No neck tenderness per  Skin: Skin is warm.     ED Treatments / Results  Labs (all labs ordered are listed, but only abnormal results are displayed) Labs Reviewed - No data to display  EKG None  Radiology Dg Shoulder Right  Result Date: 03/01/2018 CLINICAL DATA:  Right shoulder pain. EXAM: RIGHT SHOULDER - 2+ VIEW COMPARISON:  No recent prior. FINDINGS: Acromioclavicular and glenohumeral degenerative change. No evidence of fracture or dislocation. Calcific densities noted over the supraspinatus space. This is most consistent calcific supraspinatus tendinosis. No acute abnormality identified. IMPRESSION: Acromioclavicular glenohumeral degenerative change. Calcific supraspinatus tendinosis. Electronically Signed   By:  Marcello Moores  Register   On: 03/01/2018 15:33    Procedures Procedures (including critical care time)  Medications Ordered in ED Medications - No data to display   Initial Impression / Assessment and Plan / ED Course  I have reviewed the triage vital signs and the nursing notes.  Pertinent labs & imaging results that were available during my care of the patient were reviewed by me and considered in my medical decision making (see chart for details).     Patient  with right shoulder pain.  Began around 5 days ago.  Severe with movement.  Has supraspinatus tendinopathy on x-ray along with some degenerative changes.  Sling for comfort but instructed on range of motion.  We will give pain medicine and short course of steroids.  Have follow-up with sports medicine.  Final Clinical Impressions(s) / ED Diagnoses   Final diagnoses:  Acute pain of right shoulder    ED Discharge Orders         Ordered    predniSONE (DELTASONE) 20 MG tablet  Daily     03/01/18 1604    HYDROcodone-acetaminophen (NORCO/VICODIN) 5-325 MG tablet  Every 6 hours PRN     03/01/18 1604           Davonna Belling, MD 03/01/18 1630

## 2018-03-01 NOTE — ED Triage Notes (Signed)
Pt c/o right shoulder injury  X 5 days , no relief with OTC meds

## 2018-03-19 ENCOUNTER — Encounter: Payer: Self-pay | Admitting: Medical

## 2018-03-19 ENCOUNTER — Other Ambulatory Visit: Payer: Self-pay

## 2018-03-19 ENCOUNTER — Emergency Department (HOSPITAL_BASED_OUTPATIENT_CLINIC_OR_DEPARTMENT_OTHER)
Admission: EM | Admit: 2018-03-19 | Discharge: 2018-03-19 | Disposition: A | Discharge status: Home / Self Care | Payer: BLUE CROSS/BLUE SHIELD | Attending: Emergency Medicine | Admitting: Emergency Medicine

## 2018-03-19 ENCOUNTER — Encounter (HOSPITAL_BASED_OUTPATIENT_CLINIC_OR_DEPARTMENT_OTHER): Payer: Self-pay

## 2018-03-19 ENCOUNTER — Ambulatory Visit: Payer: Self-pay | Admitting: *Deleted

## 2018-03-19 DIAGNOSIS — Z5321 Procedure and treatment not carried out due to patient leaving prior to being seen by health care provider: Secondary | ICD-10-CM | POA: Insufficient documentation

## 2018-03-19 DIAGNOSIS — H9202 Otalgia, left ear: Secondary | ICD-10-CM | POA: Diagnosis not present

## 2018-03-19 NOTE — Telephone Encounter (Signed)
Pt called with having swelling on the left side of her face with her ear involvement. She has pain going down the left side of her face to her neck. The left ear is painful to touch in the front and inside. She denies fever. The pain is constant.  The pain # is 8. It hurts to yawn or open her mouth. She has been taking Tylenol and ibuprofen for the pain. Per protocol she should be seen within 4 hours. She is at work right now and requesting an appointment for after 4 today. No available appointments. Recommend that she goes to an urgent care. Pt voiced understanding. Will route to flow at [facility]. Placed on the wait list for cancellations.  Reason for Disposition . [1] Swollen area of face AND [2] is painful to touch  Answer Assessment - Initial Assessment Questions 1. ONSET: "When did the pain start?" (e.g., minutes, hours, days)     On Sunday 2. ONSET: "Does the pain come and go, or has it been constant since it started?" (e.g., constant, intermittent, fleeting)     constant 3. SEVERITY: "How bad is the pain?"   (Scale 1-10; mild, moderate or severe)   - MILD (1-3): doesn't interfere with normal activities    - MODERATE (4-7): interferes with normal activities or awakens from sleep    - SEVERE (8-10): excruciating pain, unable to do any normal activities      Pain #8 4. LOCATION: "Where does it hurt?"      Left ear  5. RASH: "Is there any redness, rash, or swelling of the face?"     Swelling on the left side of face 6. FEVER: "Do you have a fever?" If so, ask: "What is it, how was it measured, and when did it start?"      Feels slightly has fever 7. OTHER SYMPTOMS: "Do you have any other symptoms?" (e.g., fever, toothache, nasal discharge, nasal congestion, clicking sensation in jaw joint)     Nasal congestion and discharge 8. PREGNANCY: "Is there any chance you are pregnant?" "When was your last menstrual period?"      LMP a couple of months ago, September  Protocols used:  [facility]

## 2018-03-19 NOTE — ED Triage Notes (Addendum)
C/o pain to left ear x 3 days-painful to touch-NAD-steady gait-states she attempted to be seen by PCP-was advised to go to [facility] or ED

## 2018-03-20 ENCOUNTER — Encounter

## 2018-03-21 ENCOUNTER — Encounter: Payer: Self-pay | Admitting: Medical

## 2018-03-21 ENCOUNTER — Ambulatory Visit (INDEPENDENT_AMBULATORY_CARE_PROVIDER_SITE_OTHER): Payer: BLUE CROSS/BLUE SHIELD | Admitting: Medical

## 2018-03-21 VITALS — BP 112/79 | HR 74 | Temp 98.4°F | Resp 16 | Ht 66.5 in | Wt 258.0 lb

## 2018-03-21 DIAGNOSIS — H6690 Otitis media, unspecified, unspecified ear: Secondary | ICD-10-CM

## 2018-03-21 DIAGNOSIS — H60502 Unspecified acute noninfective otitis externa, left ear: Secondary | ICD-10-CM | POA: Diagnosis not present

## 2018-03-21 MED ORDER — AMOXICILLIN-POT CLAVULANATE 875-125 MG PO TABS: 1.0000 | ORAL_TABLET | Freq: Two times a day (BID) | ORAL | 0 refills | Status: DC

## 2018-03-21 MED ORDER — NEOMYCIN-POLYMYXIN-HC 3.5-10000-1 OT SOLN: 3.0000 [drp] | Freq: Four times a day (QID) | OTIC | 0 refills | Status: DC

## 2018-03-21 MED ORDER — CEFTRIAXONE SODIUM 1 G IJ SOLR
1.0000 g | Freq: Once | INTRAMUSCULAR | Status: AC
  Administered 2018-03-21: 1 g via INTRAMUSCULAR

## 2018-03-21 NOTE — Patient Instructions (Signed)
You do appear to have left otitis media/ear infection along with otitis externa/swimmer's ear.  He has been moderate to severe pain and had difficulty getting in for office visit so decided to give you Rocephin 1 g IM injection.  Also sent in Augmentin antibiotic to your pharmacy.  In addition sent in Cortisporin otic drops.  Your ear discomfort should gradually subside.  If worsens or changes please let us know.  Follow-up in 7 to 10 days or as needed.

## 2018-03-21 NOTE — Progress Notes (Signed)
Subjective:    Patient ID: Jasmin Stewart, female    DOB: 22-Feb-1966, 52 y.o.   MRN: [id number]  HPI  Pt in for some left ear pain since Sunday. Monday night pain increased. Tuesday entire ear hurt and tragal area pain. Pt tried to go to ED but she had to wait 4 hours and did not get seen. Pt found some amoxicillin yesterday and she took yesterday. She feels some better today. But still moderate to severe pain.   Patient denies any preceding nasal congestion or sinus pain before her ear pain.  No teeth pain reported.    Review of Systems  Constitutional: Negative for chills, fatigue and fever.  HENT: Positive for ear pain. Negative for congestion, sinus pressure, sinus pain, sneezing and sore throat.   Respiratory: Negative for cough, chest tightness, shortness of breath and wheezing.   Cardiovascular: Negative for chest pain and palpitations.  Gastrointestinal: Negative for abdominal pain.  Skin: Negative for rash.  Neurological: Negative for dizziness, speech difficulty, weakness, light-headedness, numbness and headaches.  Hematological: Negative for adenopathy. Does not bruise/bleed easily.  Psychiatric/Behavioral: Negative for behavioral problems.   Past Medical History:  Diagnosis Date  . Anemia   . Asthma   . Fibroid, uterine    never had surgery  . History of blood transfusion 1991; 1995   related to childbirth  . History of chicken pox    Age 51  . Hypercholesteremia   . Hypoglycemia   . Hypokalemia   . Pneumonia 2013,2014, 2018  . PVC (premature ventricular contraction)   . Ulcer of esophagus      Social History   Socioeconomic History  . Marital status: Single    Spouse name: Not on file  . Number of children: Not on file  . Years of education: Not on file  . Highest education level: Not on file  Occupational History  . Not on file  Social Needs  . Financial resource strain: Not on file  . Food insecurity:    Worry: Not on file    Inability: Not on file   . Transportation needs:    Medical: Not on file    Non-medical: Not on file  Tobacco Use  . Smoking status: Current Every Day Smoker    Packs/day: 0.40    Years: 36.00    Pack years: 14.40    Types: Cigarettes  . Smokeless tobacco: Never Used  Substance and Sexual Activity  . Alcohol use: Not Currently    Alcohol/week: 0.0 standard drinks  . Drug use: No  . Sexual activity: Yes    Birth control/protection: Post-menopausal    Comment: 1st intercourse 52 yo-More than 5 partners  Lifestyle  . Physical activity:    Days per week: Not on file    Minutes per session: Not on file  . Stress: Not on file  Relationships  . Social connections:    Talks on phone: Not on file    Gets together: Not on file    Attends religious service: Not on file    Active member of club or organization: Not on file    Attends meetings of clubs or organizations: Not on file    Relationship status: Not on file  . Intimate partner violence:    Fear of current or ex partner: Not on file    Emotionally abused: Not on file    Physically abused: Not on file    Forced sexual activity: Not on file  Other Topics  Concern  . Not on file  Social History Narrative  . Not on file    Past Surgical History:  Procedure Laterality Date  . ABDOMINAL HERNIA REPAIR  2007; 06/05/2017   Suprapubic, Surgery 2007 with mesh VHR w/mesh; OPEN VENTRAL HERNIA REPAIR WITH MESH  . [place]; 1995; 1998  . ESOPHAGOGASTRODUODENOSCOPY  01/2017  . HERNIA REPAIR  2007; 06/05/2017  . INSERTION OF MESH N/A 06/05/2017   Procedure: INSERTION OF MESH;  Surgeon: Donnie Mesa, MD;  Location: [place];  Service: General;  Laterality: N/A;  . VENTRAL HERNIA REPAIR N/A 06/05/2017   Procedure: OPEN VENTRAL HERNIA REPAIR WITH MESH;  Surgeon: Donnie Mesa, MD;  Location: [place];  Service: General;  Laterality: N/A;    Family History  Problem Relation Age of Onset  . Thyroid disease Mother   . Atrial fibrillation Mother   .  Hyperlipidemia Mother   . Hyperlipidemia Sister   . Hyperlipidemia Brother   . Thyroid disease Maternal Grandmother   . Glaucoma Maternal Grandmother   . Cataracts Maternal Grandmother   . Esophageal cancer Maternal Grandfather   . Hypertension Maternal Uncle   . Obesity Maternal Uncle   . Allergies Son        Hay Fever  . Healthy Daughter     No Known Allergies  No current outpatient medications on file prior to visit.   No current facility-administered medications on file prior to visit.     BP 112/79   Pulse 74   Temp 98.4 F (36.9 C) (Oral)   Resp 16   Ht 5' 6.5" (1.689 m)   Wt 258 lb (117 kg)   LMP 04/29/2017 Comment: patient has some spotting last spotting was in January   SpO2 97%   BMI 41.02 kg/m       Objective:   Physical Exam  General  Mental Status - Alert. General Appearance - Well groomed. Not in acute distress.  Skin Rashes- No Rashes.  HEENT Head- Normal. Ear Auditory Canal - Left-moderate canal swelling right - Normal.Tympanic Membrane- Left-about two thirds of left tympanic membrane have bright red appearance.. Right- Normal. Severe tragal tenderness to palpation in the canal looks a little swollen as well. Eye Sclera/Conjunctiva- Left- Normal. Right- Normal. Nose & Sinuses Nasal Mucosa- Left-  Boggy and Congested. Right-  Boggy and  Congested.Bilateral  nomaxillary and  No frontal sinus pressure. Mouth & Throat Lips: Upper Lip- Normal: no dryness, cracking, pallor, cyanosis, or vesicular eruption. Lower Lip-Normal: no dryness, cracking, pallor, cyanosis or vesicular eruption. Buccal Mucosa- Bilateral- No Aphthous ulcers. Oropharynx- No Discharge or Erythema. Tonsils: Characteristics- Bilateral- No Erythema or Congestion. Size/Enlargement- Bilateral- No enlargement. Discharge- bilateral-None.  Neck Neck- Supple. No Masses.   Chest and Lung Exam Auscultation: Breath Sounds:-Clear even and  unlabored.  Cardiovascular Auscultation:Rythm- Regular, rate and rhythm. Murmurs & Other Heart Sounds:Ausculatation of the heart reveal- No Murmurs.  Lymphatic Head & Neck General Head & Neck Lymphatics: Bilateral: Description- No Localized lymphadenopathy.       Assessment & Plan:  You do appear to have left otitis media/ear infection along with otitis externa/swimmer's ear.  He has been moderate to severe pain and had difficulty getting in for office visit so decided to give you Rocephin 1 g IM injection.  Also sent in Augmentin antibiotic to your pharmacy.  In addition sent in Cortisporin otic drops.  Your ear discomfort should gradually subside.  If worsens or changes please let us know.  Follow-up in 7  to 10 days or as needed.  Mackie Pai, PA-C

## 2018-03-23 ENCOUNTER — Encounter: Payer: Self-pay | Admitting: Medical

## 2018-03-25 ENCOUNTER — Telehealth: Payer: Self-pay | Admitting: Medical

## 2018-03-25 NOTE — Telephone Encounter (Signed)
Opened to review 

## 2018-04-11 ENCOUNTER — Ambulatory Visit: Payer: BLUE CROSS/BLUE SHIELD | Admitting: Cardiovascular Disease

## 2018-04-19 ENCOUNTER — Ambulatory Visit (INDEPENDENT_AMBULATORY_CARE_PROVIDER_SITE_OTHER): Payer: BLUE CROSS/BLUE SHIELD | Admitting: Cardiovascular Disease

## 2018-04-19 ENCOUNTER — Encounter: Payer: Self-pay | Admitting: Cardiovascular Disease

## 2018-04-19 VITALS — BP 122/74 | HR 74 | Ht 66.5 in | Wt 257.4 lb

## 2018-04-19 DIAGNOSIS — E785 Hyperlipidemia, unspecified: Secondary | ICD-10-CM | POA: Insufficient documentation

## 2018-04-19 DIAGNOSIS — I493 Ventricular premature depolarization: Secondary | ICD-10-CM | POA: Diagnosis not present

## 2018-04-19 DIAGNOSIS — E782 Mixed hyperlipidemia: Secondary | ICD-10-CM | POA: Diagnosis not present

## 2018-04-19 NOTE — Progress Notes (Signed)
04/19/2018 Jasmin Stewart   29-Nov-1965  [id number]  Primary Physician Saguier, Iris Pert Primary Cardiologist: Lorretta Harp MD Jasmin Stewart, [place]  HPI:  Jasmin Stewart is a 53 y.o.  mildly overweight female mother of 51, grandmother to grandchildren works at [employer] in [employer] .  I last saw her in the office 06/09/2016.  Her cardiac risk factors include 10-20-pack-year history of tobacco abuse currently smoking one third pack per day but otherwise negative. She did have episode of palpitations back in May 2012 and had a Holter monitor that showed PVCs. She was on metoprolol for 6 months which was then discontinued. She recently had sinusitis and pneumonia was treated with bronchodilators and pseudoephedrine. She did notice some palpitations similar to her prior PVCs over these have since resolved and the left several weeks.  As I saw her 2 years ago she is remained stable.  She has had an inguinal hernia repair by Dr. Molli Posey 06/05/2017 which was successful and uncomplicated.  She denies chest pain or shortness of breath.  She apparently needs a colonoscopy and is here for clearance.  Current Meds  Medication Sig  . esomeprazole (NEXIUM) 20 MG capsule Take 20 mg by mouth daily at 12 noon.     No Known Allergies  Social History   Socioeconomic History  . Marital status: Single    Spouse name: Not on file  . Number of children: Not on file  . Years of education: Not on file  . Highest education level: Not on file  Occupational History  . Not on file  Social Needs  . Financial resource strain: Not on file  . Food insecurity:    Worry: Not on file    Inability: Not on file  . Transportation needs:    Medical: Not on file    Non-medical: Not on file  Tobacco Use  . Smoking status: Current Every Day Smoker    Packs/day: 0.40    Years: 36.00    Pack years: 14.40    Types: Cigarettes  . Smokeless tobacco: Never Used  Substance and Sexual Activity  .  Alcohol use: Not Currently    Alcohol/week: 0.0 standard drinks  . Drug use: No  . Sexual activity: Yes    Birth control/protection: Post-menopausal    Comment: 1st intercourse 53 yo-More than 5 partners  Lifestyle  . Physical activity:    Days per week: Not on file    Minutes per session: Not on file  . Stress: Not on file  Relationships  . Social connections:    Talks on phone: Not on file    Gets together: Not on file    Attends religious service: Not on file    Active member of club or organization: Not on file    Attends meetings of clubs or organizations: Not on file    Relationship status: Not on file  . Intimate partner violence:    Fear of current or ex partner: Not on file    Emotionally abused: Not on file    Physically abused: Not on file    Forced sexual activity: Not on file  Other Topics Concern  . Not on file  Social History Narrative  . Not on file     Review of Systems: General: negative for chills, fever, night sweats or weight changes.  Cardiovascular: negative for chest pain, dyspnea on exertion, edema, orthopnea, palpitations, paroxysmal nocturnal dyspnea or shortness of breath Dermatological:  negative for rash Respiratory: negative for cough or wheezing Urologic: negative for hematuria Abdominal: negative for nausea, vomiting, diarrhea, bright red blood per rectum, melena, or hematemesis Neurologic: negative for visual changes, syncope, or dizziness All other systems reviewed and are otherwise negative except as noted above.    Blood pressure 122/74, pulse 74, height 5' 6.5" (1.689 m), weight 257 lb 6.4 oz (116.8 kg), last menstrual period 04/29/2017.  General appearance: alert and no distress Neck: no adenopathy, no carotid bruit, no JVD, supple, symmetrical, trachea midline and thyroid not enlarged, symmetric, no tenderness/mass/nodules Lungs: clear to auscultation bilaterally Heart: regular rate and rhythm, S1, S2 normal, no murmur, click, rub  or gallop Extremities: extremities normal, atraumatic, no cyanosis or edema Pulses: 2+ and symmetric Skin: Skin color, texture, turgor normal. No rashes or lesions Neurologic: Alert and oriented X 3, normal strength and tone. Normal symmetric reflexes. Normal coordination and gait  EKG sinus rhythm at 74 without ST or T wave changes. I Personally reviewed this EKG.  ASSESSMENT AND PLAN:   PVC's (premature ventricular contractions) History of PVCs in the past, none currently  Hyperlipidemia History of hyperlipidemia with total cholesterol measured 09/12/2017 of 237, LDL of 168 and HDL of 40.  She is followed by her PCP.  Suggest that he shoot for an LDL of less than 100 either by diet and exercise or beginning a statin drug.  I will leave this up to her PCP to decide.      Lorretta Harp MD FACP,FACC,FAHA, [facility] 04/19/2018 5:11 PM

## 2018-04-19 NOTE — Assessment & Plan Note (Signed)
History of PVCs in the past, none currently

## 2018-04-19 NOTE — Patient Instructions (Signed)
Medication Instructions:  none If you need a refill on your cardiac medications before your next appointment, please call your pharmacy.   Lab work: none If you have labs (blood work) drawn today and your tests are completely normal, you will receive your results only by: Marland Kitchen MyChart Message (if you have MyChart) OR . A paper copy in the mail If you have any lab test that is abnormal or we need to change your treatment, we will call you to review the results.  Testing/Procedures: none  Follow-Up: At [facility], you and your health needs are our priority.  As part of our continuing mission to provide you with exceptional heart care, we have created designated Provider Care Teams.  These Care Teams include your primary Cardiologist (physician) and Advanced Practice Providers (APPs -  Physician Assistants and Nurse Practitioners) who all work together to provide you with the care you need, when you need it. . You will need a follow up appointment in 12 months.  Please call our office 2 months in advance to schedule this appointment.  You may see Dr. Gwenlyn Found or one of the following Advanced Practice Providers on your designated Care Team:   . Kerin Ransom, [place] . Almyra Deforest, PA-C . Fabian Sharp, PA-C . Jory Sims, DNP . Rosaria Ferries, PA-C . Roby Lofts, PA-C . Sande Rives, PA-C  Any Other Special Instructions Will Be Listed Below (If Applicable). YOU HAVE BEEN CLEARED AT LOW RISK FROM A CARDIAC STANDPOINT FOR YOUR UPCOMING PROCEDURE/SURGERY

## 2018-04-19 NOTE — Assessment & Plan Note (Signed)
History of hyperlipidemia with total cholesterol measured 09/12/2017 of 237, LDL of 168 and HDL of 40.  She is followed by her PCP.  Suggest that he shoot for an LDL of less than 100 either by diet and exercise or beginning a statin drug.  I will leave this up to her PCP to decide.

## 2018-04-22 ENCOUNTER — Encounter: Payer: Self-pay | Admitting: Internal Medicine

## 2018-05-17 ENCOUNTER — Ambulatory Visit (AMBULATORY_SURGERY_CENTER): Payer: BLUE CROSS/BLUE SHIELD | Admitting: *Deleted

## 2018-05-17 VITALS — Ht 66.5 in | Wt 250.8 lb

## 2018-05-17 DIAGNOSIS — Z1211 Encounter for screening for malignant neoplasm of colon: Secondary | ICD-10-CM

## 2018-05-17 MED ORDER — NA SULFATE-K SULFATE-MG SULF 17.5-3.13-1.6 GM/177ML PO SOLN: ORAL | 0 refills | Status: DC

## 2018-05-17 NOTE — Progress Notes (Signed)
Patient denies any allergies to eggs or soy. Patient denies any problems with anesthesia/sedation. Patient denies any oxygen use at home. Patient denies taking any diet/weight loss medications or blood thinners. EMMI education assisgned to patient on colonoscopy, this was explained and instructions given to patient. Suprep $50 pnm coupon given to pt.

## 2018-05-31 ENCOUNTER — Encounter: Payer: BLUE CROSS/BLUE SHIELD | Admitting: Internal Medicine

## 2018-06-07 ENCOUNTER — Encounter: Payer: BLUE CROSS/BLUE SHIELD | Admitting: Internal Medicine

## 2018-06-07 ENCOUNTER — Encounter: Payer: Self-pay | Admitting: Internal Medicine

## 2018-06-07 ENCOUNTER — Ambulatory Visit (AMBULATORY_SURGERY_CENTER): Payer: BLUE CROSS/BLUE SHIELD | Admitting: Internal Medicine

## 2018-06-07 VITALS — BP 122/73 | HR 68 | Temp 98.6°F | Resp 13 | Ht 66.5 in | Wt 250.0 lb

## 2018-06-07 DIAGNOSIS — Z1211 Encounter for screening for malignant neoplasm of colon: Secondary | ICD-10-CM

## 2018-06-07 DIAGNOSIS — D123 Benign neoplasm of transverse colon: Secondary | ICD-10-CM | POA: Diagnosis not present

## 2018-06-07 DIAGNOSIS — D124 Benign neoplasm of descending colon: Secondary | ICD-10-CM

## 2018-06-07 DIAGNOSIS — D125 Benign neoplasm of sigmoid colon: Secondary | ICD-10-CM | POA: Diagnosis not present

## 2018-06-07 MED ORDER — SODIUM CHLORIDE 0.9 % IV SOLN: 500.0000 mL | Freq: Once | INTRAVENOUS | Status: DC

## 2018-06-07 NOTE — Progress Notes (Signed)
Called to room to assist during endoscopic procedure.  Patient ID and intended procedure confirmed with present staff. Received instructions for my participation in the procedure from the performing physician.  

## 2018-06-07 NOTE — Patient Instructions (Signed)
    Information on polyps & hemorrhoids given to you today  Await pathology results on polyps removed    YOU HAD AN ENDOSCOPIC PROCEDURE TODAY AT [place]:   Refer to the procedure report that was given to you for any specific questions about what was found during the examination.  If the procedure report does not answer your questions, please call your gastroenterologist to clarify.  If you requested that your care partner not be given the details of your procedure findings, then the procedure report has been included in a sealed envelope for you to review at your convenience later.  YOU SHOULD EXPECT: Some feelings of bloating in the abdomen. Passage of more gas than usual.  Walking can help get rid of the air that was put into your GI tract during the procedure and reduce the bloating. If you had a lower endoscopy (such as a colonoscopy or flexible sigmoidoscopy) you may notice spotting of blood in your stool or on the toilet paper. If you underwent a bowel prep for your procedure, you may not have a normal bowel movement for a few days.  Please Note:  You might notice some irritation and congestion in your nose or some drainage.  This is from the oxygen used during your procedure.  There is no need for concern and it should clear up in a day or so.  SYMPTOMS TO REPORT IMMEDIATELY:   Following lower endoscopy (colonoscopy or flexible sigmoidoscopy):  Excessive amounts of blood in the stool  Significant tenderness or worsening of abdominal pains  Swelling of the abdomen that is new, acute  Fever of 100F or higher    For urgent or emergent issues, a gastroenterologist can be reached at any hour by calling [phone].   DIET:  We do recommend a small meal at first, but then you may proceed to your regular diet.  Drink plenty of fluids but you should avoid alcoholic beverages for 24 hours.  ACTIVITY:  You should plan to take it easy for the rest of today and you  should NOT DRIVE or use heavy machinery until tomorrow (because of the sedation medicines used during the test).    FOLLOW UP: Our staff will call the number listed on your records the next business day following your procedure to check on you and address any questions or concerns that you may have regarding the information given to you following your procedure. If we do not reach you, we will leave a message.  However, if you are feeling well and you are not experiencing any problems, there is no need to return our call.  We will assume that you have returned to your regular daily activities without incident.  If any biopsies were taken you will be contacted by phone or by letter within the next 1-3 weeks.  Please call us at [phone] if you have not heard about the biopsies in 3 weeks.    SIGNATURES/CONFIDENTIALITY: You and/or your care partner have signed paperwork which will be entered into your electronic medical record.  These signatures attest to the fact that that the information above on your After Visit Summary has been reviewed and is understood.  Full responsibility of the confidentiality of this discharge information lies with you and/or your care-partner.

## 2018-06-07 NOTE — Op Note (Signed)
[facility] Patient Name: Jasmin Stewart Procedure Date: 06/07/2018 1:20 PM MRN: [id number] Endoscopist: Jerene Bears , MD Age: 53 Referring MD:  Date of Birth: 02/18/1966 Gender: Female Account #: [id number] Procedure:                Colonoscopy Indications:              Screening for colorectal malignant neoplasm, This                            is the patient's first colonoscopy Medicines:                Monitored Anesthesia Care Procedure:                Pre-Anesthesia Assessment:                           - Prior to the procedure, a History and Physical                            was performed, and patient medications and                            allergies were reviewed. The patient's tolerance of                            previous anesthesia was also reviewed. The risks                            and benefits of the procedure and the sedation                            options and risks were discussed with the patient.                            All questions were answered, and informed consent                            was obtained. Prior Anticoagulants: The patient has                            taken no previous anticoagulant or antiplatelet                            agents. ASA Grade Assessment: II - A patient with                            mild systemic disease. After reviewing the risks                            and benefits, the patient was deemed in                            satisfactory condition to undergo the procedure.  After obtaining informed consent, the colonoscope                            was passed under direct vision. Throughout the                            procedure, the patient's blood pressure, pulse, and                            oxygen saturations were monitored continuously. The                            Colonoscope was introduced through the anus and                            advanced to the cecum, identified  by appendiceal                            orifice and ileocecal valve. The colonoscopy was                            performed without difficulty. The patient tolerated                            the procedure well. The quality of the bowel                            preparation was good. The ileocecal valve,                            appendiceal orifice, and rectum were photographed. Scope In: 1:42:56 PM Scope Out: 2:00:01 PM Scope Withdrawal Time: 0 hours 13 minutes 12 seconds  Total Procedure Duration: 0 hours 17 minutes 5 seconds  Findings:                 The digital rectal exam was normal.                           Two sessile polyps were found in the transverse                            colon. The polyps were 4 to 7 mm in size. These                            polyps were removed with a cold snare. Resection                            and retrieval were complete.                           A 5 mm polyp was found in the descending colon. The                            polyp was sessile.  The polyp was removed with a                            cold snare. Resection and retrieval were complete.                           Two sessile polyps were found in the sigmoid colon.                            The polyps were 4 to 5 mm in size. These polyps                            were removed with a cold snare. Resection and                            retrieval were complete.                           Internal hemorrhoids were found during                            retroflexion. The hemorrhoids were small. Complications:            No immediate complications. Estimated Blood Loss:     Estimated blood loss was minimal. Impression:               - Two 4 to 7 mm polyps in the transverse colon,                            removed with a cold snare. Resected and retrieved.                           - One 5 mm polyp in the descending colon, removed                            with a cold snare.  Resected and retrieved.                           - Two 4 to 5 mm polyps in the sigmoid colon,                            removed with a cold snare. Resected and retrieved.                           - Small internal hemorrhoids. Recommendation:           - Patient has a contact number available for                            emergencies. The signs and symptoms of potential                            delayed complications were discussed with the  patient. Return to normal activities tomorrow.                            Written discharge instructions were provided to the                            patient.                           - Resume previous diet.                           - Continue present medications.                           - Await pathology results.                           - Repeat colonoscopy is recommended for                            surveillance. The colonoscopy date will be                            determined after pathology results from today's                            exam become available for review. Jerene Bears, MD 06/07/2018 2:04:57 PM This report has been signed electronically.

## 2018-06-10 ENCOUNTER — Telehealth: Payer: Self-pay | Admitting: *Deleted

## 2018-06-10 NOTE — Telephone Encounter (Signed)
  Follow up Call-  Call back number 06/07/2018 11/02/2016  Post procedure Call Back phone  # [phone] Ext [phone]  Permission to leave phone message Yes Yes  Some recent data might be hidden     Patient questions:  Do you have a fever, pain , or abdominal swelling? No. Pain Score  0 *  Have you tolerated food without any problems? Yes.    Have you been able to return to your normal activities? Yes.    Do you have any questions about your discharge instructions: Diet   No. Medications  No. Follow up visit  No.  Do you have questions or concerns about your Care? No.  Actions: * If pain score is 4 or above: No action needed, pain <4.

## 2018-06-13 ENCOUNTER — Encounter: Payer: Self-pay | Admitting: Internal Medicine

## 2018-07-18 DIAGNOSIS — F331 Major depressive disorder, recurrent, moderate: Secondary | ICD-10-CM | POA: Diagnosis not present

## 2018-08-01 DIAGNOSIS — F331 Major depressive disorder, recurrent, moderate: Secondary | ICD-10-CM | POA: Diagnosis not present

## 2018-08-08 DIAGNOSIS — F331 Major depressive disorder, recurrent, moderate: Secondary | ICD-10-CM | POA: Diagnosis not present

## 2018-08-22 DIAGNOSIS — F331 Major depressive disorder, recurrent, moderate: Secondary | ICD-10-CM | POA: Diagnosis not present

## 2018-08-29 DIAGNOSIS — F331 Major depressive disorder, recurrent, moderate: Secondary | ICD-10-CM | POA: Diagnosis not present

## 2018-09-25 DIAGNOSIS — F331 Major depressive disorder, recurrent, moderate: Secondary | ICD-10-CM | POA: Diagnosis not present

## 2018-10-02 DIAGNOSIS — F331 Major depressive disorder, recurrent, moderate: Secondary | ICD-10-CM | POA: Diagnosis not present

## 2018-10-15 ENCOUNTER — Encounter: Payer: Self-pay | Admitting: Medical

## 2018-10-17 DIAGNOSIS — F331 Major depressive disorder, recurrent, moderate: Secondary | ICD-10-CM | POA: Diagnosis not present

## 2018-10-24 DIAGNOSIS — F331 Major depressive disorder, recurrent, moderate: Secondary | ICD-10-CM | POA: Diagnosis not present

## 2018-11-07 DIAGNOSIS — F331 Major depressive disorder, recurrent, moderate: Secondary | ICD-10-CM | POA: Diagnosis not present

## 2018-11-14 DIAGNOSIS — F331 Major depressive disorder, recurrent, moderate: Secondary | ICD-10-CM | POA: Diagnosis not present

## 2018-11-28 ENCOUNTER — Ambulatory Visit: Payer: BC Managed Care – PPO | Admitting: Medical

## 2018-11-28 ENCOUNTER — Other Ambulatory Visit: Payer: Self-pay

## 2018-11-28 ENCOUNTER — Encounter: Payer: Self-pay | Admitting: Medical

## 2018-11-28 ENCOUNTER — Ambulatory Visit (HOSPITAL_BASED_OUTPATIENT_CLINIC_OR_DEPARTMENT_OTHER)
Admission: RE | Admit: 2018-11-28 | Discharge: 2018-11-28 | Disposition: A | Discharge status: Home / Self Care | Payer: BC Managed Care – PPO | Source: Ambulatory Visit | Attending: Medical | Admitting: Medical

## 2018-11-28 VITALS — BP 118/70 | HR 77 | Temp 97.4°F | Resp 16 | Ht 66.5 in | Wt 239.0 lb

## 2018-11-28 DIAGNOSIS — M25512 Pain in left shoulder: Secondary | ICD-10-CM

## 2018-11-28 MED ORDER — KETOROLAC TROMETHAMINE 30 MG/ML IJ SOLN
30.0000 mg | Freq: Once | INTRAMUSCULAR | Status: AC
  Administered 2018-11-28: 30 mg via INTRAMUSCULAR

## 2018-11-28 NOTE — Patient Instructions (Signed)
For left shoulder pain(bicep tendon and left upper outer pectoralis area/adjacent to shoulder), we gave you toradol 30 mg im today and recommend over the counter voltaren gel to area of tenderness. Will get xray today. Will go ahead and refer you to sports medicine since pain exceeding/persisting for more than 3 weeks.  Follow up in date to be determined after sports med review.

## 2018-11-28 NOTE — Progress Notes (Signed)
Subjective:    Patient ID: Jasmin Stewart, female    DOB: 08-Sep-1965, 53 y.o.   MRN: [id number]  HPI  Pt in for evaluation for left shoulder pain. She states noticed the pain after kayaking. Kayaking at [facility]. Pain since August 1st. Kayak for about 4 hours.   Pain when lifts arm up. Some pain on minimal range of motion. Some pain at times without movement.   Pt saw cardiologist in past for palpitation and pvc. No jaw pain, no arm pain, no sob, and no wheezing.    Review of Systems  Constitutional: Negative for chills, fatigue and fever.  Respiratory: Negative for choking, chest tightness, shortness of breath, wheezing and stridor.   Cardiovascular: Negative for chest pain and palpitations.  Gastrointestinal: Negative for abdominal pain.  Musculoskeletal: Negative for back pain, gait problem, joint swelling and neck pain.       Left shoulder pain.  Skin: Negative for rash.  Neurological: Negative for dizziness, speech difficulty, weakness, numbness and headaches.  Hematological: Negative for adenopathy. Does not bruise/bleed easily.  Psychiatric/Behavioral: Negative for behavioral problems, decreased concentration and sleep disturbance. The patient is not nervous/anxious.     Past Medical History:  Diagnosis Date  . Anemia   . Asthma   . Fibroid, uterine    never had surgery  . History of blood transfusion 1991; 1995   related to childbirth  . History of chicken pox    Age 87  . Hypercholesteremia   . Hypoglycemia   . Hypokalemia   . Pneumonia 2013,2014, 2018  . PVC (premature ventricular contraction)   . Ulcer of esophagus      Social History   Socioeconomic History  . Marital status: Single    Spouse name: Not on file  . Number of children: Not on file  . Years of education: Not on file  . Highest education level: Not on file  Occupational History  . Not on file  Social Needs  . Financial resource strain: Not on file  . Food insecurity    Worry:  Not on file    Inability: Not on file  . Transportation needs    Medical: Not on file    Non-medical: Not on file  Tobacco Use  . Smoking status: Current Every Day Smoker    Packs/day: 0.25    Years: 36.00    Pack years: 9.00    Types: Cigarettes  . Smokeless tobacco: Never Used  Substance and Sexual Activity  . Alcohol use: Not Currently    Alcohol/week: 0.0 standard drinks  . Drug use: No  . Sexual activity: Yes    Birth control/protection: Post-menopausal    Comment: 1st intercourse 53 yo-More than 5 partners  Lifestyle  . Physical activity    Days per week: Not on file    Minutes per session: Not on file  . Stress: Not on file  Relationships  . Social Herbalist on phone: Not on file    Gets together: Not on file    Attends religious service: Not on file    Active member of club or organization: Not on file    Attends meetings of clubs or organizations: Not on file    Relationship status: Not on file  . Intimate partner violence    Fear of current or ex partner: Not on file    Emotionally abused: Not on file    Physically abused: Not on file    Forced  sexual activity: Not on file  Other Topics Concern  . Not on file  Social History Narrative  . Not on file    Past Surgical History:  Procedure Laterality Date  . ABDOMINAL HERNIA REPAIR  2007; 06/05/2017   Suprapubic, Surgery 2007 with mesh VHR w/mesh; OPEN VENTRAL HERNIA REPAIR WITH MESH  . [place]; 1995; 1998  . ESOPHAGOGASTRODUODENOSCOPY  01/2017  . HERNIA REPAIR  2007; 06/05/2017  . INSERTION OF MESH N/A 06/05/2017   Procedure: INSERTION OF MESH;  Surgeon: Donnie Mesa, MD;  Location: [facility];  Service: General;  Laterality: N/A;  . VENTRAL HERNIA REPAIR N/A 06/05/2017   Procedure: OPEN VENTRAL HERNIA REPAIR WITH MESH;  Surgeon: Donnie Mesa, MD;  Location: [place];  Service: General;  Laterality: N/A;    Family History  Problem Relation Age of Onset  . Thyroid disease Mother   .  Atrial fibrillation Mother   . Hyperlipidemia Mother   . Hyperlipidemia Sister   . Breast cancer Sister   . Hyperlipidemia Brother   . Thyroid disease Maternal Grandmother   . Glaucoma Maternal Grandmother   . Cataracts Maternal Grandmother   . Esophageal cancer Maternal Grandfather   . Hypertension Maternal Uncle   . Obesity Maternal Uncle   . Allergies Son        Hay Fever  . Healthy Daughter   . Colon cancer Neg Hx   . Colon polyps Neg Hx   . Stomach cancer Neg Hx   . Rectal cancer Neg Hx     No Known Allergies  Current Outpatient Medications on File Prior to Visit  Medication Sig Dispense Refill  . esomeprazole (NEXIUM) 20 MG capsule Take 20 mg by mouth daily as needed.      No current facility-administered medications on file prior to visit.     BP 118/70   Pulse 77   Temp (!) 97.4 F (36.3 C) (Oral)   Resp 16   Ht 5' 6.5" (1.689 m)   Wt 239 lb (108.4 kg)   LMP 04/29/2017 Comment: patient has some spotting last spotting was in January   SpO2 99%   BMI 38.00 kg/m       Objective:   Physical Exam  General Mental Status- Alert. General Appearance- Not in acute distress.   Skin General: Color- Normal Color. Moisture- Normal Moisture.  Neck Carotid Arteries- Normal color. Moisture- Normal Moisture. No carotid bruits. No JVD.  Chest and Lung Exam Auscultation: Breath Sounds:-Normal.  Cardiovascular Auscultation:Rythm- Regular. Murmurs & Other Heart Sounds:Auscultation of the heart reveals- No Murmurs.   Neurologic Cranial Nerve exam:- CN III-XII intact(No nystagmus), symmetric smile. Strength:- 5/5 equal and symmetric strength both upper and lower extremities.   Left shoulder-Anterior shoulder tenderness to palpation and pain on rom. She has direct severe pain on palpation anterior area near bicep tendon.   Anterior thorax- Also left upper outer pectoralis very tender to palpation.     Assessment & Plan:  For left shoulder pain(bicep tendon  and left upper outer pectoralis area/adjacent to shoulder), we gave you toradol 30 mg im today and recommend over the counter voltaren gel to area of tenderness. Will get xray today. Will go ahead and refer you to sports medicine since pain exceeding/persisting for more than 3 weeks.  Follow up in date to be determined after sports med review.  25  Minutes spent with pt today. 50% of time spent counseling pt on plan going forward.  Mackie Pai, PA-C

## 2018-12-02 ENCOUNTER — Other Ambulatory Visit: Payer: Self-pay

## 2018-12-02 ENCOUNTER — Ambulatory Visit: Payer: Self-pay

## 2018-12-02 ENCOUNTER — Ambulatory Visit: Payer: BC Managed Care – PPO | Admitting: Family Medicine

## 2018-12-02 ENCOUNTER — Encounter: Payer: Self-pay | Admitting: Family Medicine

## 2018-12-02 VITALS — Ht 67.0 in | Wt 239.0 lb

## 2018-12-02 DIAGNOSIS — M7532 Calcific tendinitis of left shoulder: Secondary | ICD-10-CM | POA: Diagnosis not present

## 2018-12-02 MED ORDER — NITROGLYCERIN 0.2 MG/HR TD PT24: MEDICATED_PATCH | TRANSDERMAL | 11 refills | Status: DC

## 2018-12-02 MED ORDER — METHYLPREDNISOLONE ACETATE 40 MG/ML IJ SUSP
40.0000 mg | Freq: Once | INTRAMUSCULAR | Status: AC
  Administered 2018-12-02: 40 mg

## 2018-12-02 NOTE — Assessment & Plan Note (Signed)
Likely kayaking initiated the response to the origin of the pain that she is experiencing from with calcific changes.  No weakness and has good range of motion. -Injection. -Nitro patches and counseled on the use. -Counseled on home exercise therapy and supportive care. -If no improvement can consider barbotage and physical therapy.

## 2018-12-02 NOTE — Progress Notes (Signed)
Jasmin Stewart - 53 y.o. female MRN [id number]  Date of birth: 1965-05-14  SUBJECTIVE:  Including CC & ROS.  Chief Complaint  Patient presents with  . Shoulder Pain    left shoulder x 22 days    Jasmin Stewart is a 53 y.o. female that is presenting with left shoulder pain.  The pain is ongoing for about 3 weeks.  She had been doing some kayaking and noticed the pain after kayaking every weekend for 3 weekends.  The pain is constant and severe.  Is localized to the shoulder.  No improvement with Toradol injection.  She has a history of an ulcer.  Denies any weakness.  Has good range of motion.  No history of similar symptoms.  No history of surgery.  Independent review of the left shoulder x-ray from 8/20 shows calcific tendinitis.   Review of Systems  Constitutional: Negative for fever.  HENT: Negative for congestion.   Respiratory: Negative for cough.   Cardiovascular: Negative for chest pain.  Gastrointestinal: Negative for abdominal pain.  Musculoskeletal: Negative for gait problem.  Skin: Negative for color change.  Neurological: Negative for weakness.  Hematological: Negative for adenopathy.    HISTORY: Past Medical, Surgical, Social, and Family History Reviewed & Updated per EMR.   Pertinent Historical Findings include:  Past Medical History:  Diagnosis Date  . Anemia   . Asthma   . Fibroid, uterine    never had surgery  . History of blood transfusion 1991; 1995   related to childbirth  . History of chicken pox    Age 67  . Hypercholesteremia   . Hypoglycemia   . Hypokalemia   . Pneumonia 2013,2014, 2018  . PVC (premature ventricular contraction)   . Ulcer of esophagus     Past Surgical History:  Procedure Laterality Date  . ABDOMINAL HERNIA REPAIR  2007; 06/05/2017   Suprapubic, Surgery 2007 with mesh VHR w/mesh; OPEN VENTRAL HERNIA REPAIR WITH MESH  . [place]; 1995; 1998  . ESOPHAGOGASTRODUODENOSCOPY  01/2017  . HERNIA REPAIR  2007; 06/05/2017  .  INSERTION OF MESH N/A 06/05/2017   Procedure: INSERTION OF MESH;  Surgeon: Donnie Mesa, MD;  Location: [facility];  Service: General;  Laterality: N/A;  . VENTRAL HERNIA REPAIR N/A 06/05/2017   Procedure: OPEN VENTRAL HERNIA REPAIR WITH MESH;  Surgeon: Donnie Mesa, MD;  Location: [facility];  Service: General;  Laterality: N/A;    No Known Allergies  Family History  Problem Relation Age of Onset  . Thyroid disease Mother   . Atrial fibrillation Mother   . Hyperlipidemia Mother   . Hyperlipidemia Sister   . Breast cancer Sister   . Hyperlipidemia Brother   . Thyroid disease Maternal Grandmother   . Glaucoma Maternal Grandmother   . Cataracts Maternal Grandmother   . Esophageal cancer Maternal Grandfather   . Hypertension Maternal Uncle   . Obesity Maternal Uncle   . Allergies Son        Hay Fever  . Healthy Daughter   . Colon cancer Neg Hx   . Colon polyps Neg Hx   . Stomach cancer Neg Hx   . Rectal cancer Neg Hx      Social History   Socioeconomic History  . Marital status: Single    Spouse name: Not on file  . Number of children: Not on file  . Years of education: Not on file  . Highest education level: Not on file  Occupational History  .  Not on file  Social Needs  . Financial resource strain: Not on file  . Food insecurity    Worry: Not on file    Inability: Not on file  . Transportation needs    Medical: Not on file    Non-medical: Not on file  Tobacco Use  . Smoking status: Current Every Day Smoker    Packs/day: 0.25    Years: 36.00    Pack years: 9.00    Types: Cigarettes  . Smokeless tobacco: Never Used  Substance and Sexual Activity  . Alcohol use: Not Currently    Alcohol/week: 0.0 standard drinks  . Drug use: No  . Sexual activity: Yes    Birth control/protection: Post-menopausal    Comment: 1st intercourse 53 yo-More than 5 partners  Lifestyle  . Physical activity    Days per week: Not on file    Minutes per session: Not on file  . Stress: Not  on file  Relationships  . Social Herbalist on phone: Not on file    Gets together: Not on file    Attends religious service: Not on file    Active member of club or organization: Not on file    Attends meetings of clubs or organizations: Not on file    Relationship status: Not on file  . Intimate partner violence    Fear of current or ex partner: Not on file    Emotionally abused: Not on file    Physically abused: Not on file    Forced sexual activity: Not on file  Other Topics Concern  . Not on file  Social History Narrative  . Not on file     PHYSICAL EXAM:  VS: Ht 5\' 7"  (1.702 m)   Wt 239 lb (108.4 kg)   BMI 37.43 kg/m  Physical Exam Gen: NAD, alert, cooperative with exam, well-appearing ENT: normal lips, normal nasal mucosa,  Eye: normal EOM, normal conjunctiva and lids CV:  no edema, +2 pedal pulses   Resp: no accessory muscle use, non-labored,  Skin: no rashes, no areas of induration  Neuro: normal tone, normal sensation to touch Psych:  normal insight, alert and oriented MSK:  Left shoulder: Normal active flexion and abduction. Normal internal and external rotation. Pain with external rotation and abduction. Pain with empty can testing. Pain with Hawkins test. Normal grip strength. Neurovascular intact  Limited ultrasound: Left shoulder:  Biceps tendon appears to have some subluxation of the bicipital groove but no effusion or vascular uptake in this area. Normal-appearing subscapularis. Anterior leaflet of supraspinatus has tendinopathy type changes as well as calcific changes near the insertion no significant vascular uptake.  Posterior leaflet appears to be more normal in appearance.  Summary: Findings suggestive of calcific tendinitis  Ultrasound and interpretation by Clearance Coots, MD    Aspiration/Injection Procedure Note Jasmin Stewart [birth year]  Procedure: Injection Indications: Left shoulder pain  Procedure Details Consent:  Risks of procedure as well as the alternatives and risks of each were explained to the (patient/caregiver).  Consent for procedure obtained. Time Out: Verified patient identification, verified procedure, site/side was marked, verified correct patient position, special equipment/implants available, medications/allergies/relevent history reviewed, required imaging and test results available.  Performed.  The area was cleaned with iodine and alcohol swabs.    The left subacromial space was injected using 1 cc's of 40 mg Depo-Medrol and 4 cc's of 0.25% bupivacaine with a 22 1 1/2" needle.  Ultrasound was used. Images were obtained in  long views showing the injection.     A sterile dressing was applied.  Patient did tolerate procedure well.     ASSESSMENT & PLAN:   Calcific tendinitis of left shoulder Likely kayaking initiated the response to the origin of the pain that she is experiencing from with calcific changes.  No weakness and has good range of motion. -Injection. -Nitro patches and counseled on the use. -Counseled on home exercise therapy and supportive care. -If no improvement can consider barbotage and physical therapy.    \

## 2018-12-02 NOTE — Patient Instructions (Signed)
Nice to [facility] you Please try the nitro patches  Please try ice  Please try the exercises   Please send me a message in MyChart with any questions or updates.  Please see me back in 4 weeks.   --Dr. Raeford Razor  Nitroglycerin Protocol   Apply 1/4 nitroglycerin patch to affected area daily.  Change position of patch within the affected area every 24 hours.  You may experience a headache during the first 1-2 weeks of using the patch, these should subside.  If you experience headaches after beginning nitroglycerin patch treatment, you may take your preferred over the counter pain reliever.  Another side effect of the nitroglycerin patch is skin irritation or rash related to patch adhesive.  Please notify our office if you develop more severe headaches or rash, and stop the patch.  Tendon healing with nitroglycerin patch may require 12 to 24 weeks depending on the extent of injury.  Men should not use if taking Viagra, Cialis, or Levitra.   Do not use if you have migraines or rosacea.

## 2018-12-02 NOTE — Addendum Note (Signed)
Addended by: Sherrie George F on: 12/02/2018 09:54 AM   Modules accepted: Orders

## 2018-12-03 ENCOUNTER — Ambulatory Visit (INDEPENDENT_AMBULATORY_CARE_PROVIDER_SITE_OTHER): Payer: BC Managed Care – PPO | Admitting: Medical

## 2018-12-03 ENCOUNTER — Encounter: Payer: Self-pay | Admitting: Medical

## 2018-12-03 VITALS — BP 118/71 | HR 81 | Temp 97.9°F | Resp 16 | Ht 67.0 in | Wt 239.2 lb

## 2018-12-03 DIAGNOSIS — Z0000 Encounter for general adult medical examination without abnormal findings: Secondary | ICD-10-CM | POA: Diagnosis not present

## 2018-12-03 NOTE — Progress Notes (Addendum)
Subjective:    Patient ID: Jasmin Stewart, female    DOB: 07-28-1965, 53 y.o.   MRN: [id number]  HPI   Pt in for wellness exam. Pt not fasting. Had breakfast shake this morning.  Pt up to date on vaccines except flu vaccine not up to date. Explained benefit of vaccine but he still declines.  Pt has been dieting and exercising.  Pt has decreased smoking over past months.     Review of Systems  Constitutional: Negative for chills, fatigue and fever.  HENT: Negative for congestion, drooling, facial swelling, postnasal drip, sinus pressure, tinnitus and voice change.   Respiratory: Negative for cough, chest tightness, shortness of breath and wheezing.   Cardiovascular: Negative for chest pain and palpitations.  Gastrointestinal: Negative for abdominal pain, diarrhea, nausea and vomiting.  Genitourinary: Negative for difficulty urinating, enuresis and frequency.  Musculoskeletal: Negative for back pain and neck stiffness.  Skin: Negative for rash.  Neurological: Negative for dizziness, speech difficulty, weakness and light-headedness.       Mild ha onset with use of nitro patch as sports med told her she might get. Just started nitro patch.  Hematological: Negative for adenopathy. Does not bruise/bleed easily.  Psychiatric/Behavioral: Negative for behavioral problems, decreased concentration and dysphoric mood. The patient is not nervous/anxious.     Past Medical History:  Diagnosis Date  . Anemia   . Asthma   . Fibroid, uterine    never had surgery  . History of blood transfusion 1991; 1995   related to childbirth  . History of chicken pox    Age 32  . Hypercholesteremia   . Hypoglycemia   . Hypokalemia   . Pneumonia 2013,2014, 2018  . PVC (premature ventricular contraction)   . Ulcer of esophagus      Social History   Socioeconomic History  . Marital status: Single    Spouse name: Not on file  . Number of children: Not on file  . Years of education: Not on file  .  Highest education level: Not on file  Occupational History  . Not on file  Social Needs  . Financial resource strain: Not on file  . Food insecurity    Worry: Not on file    Inability: Not on file  . Transportation needs    Medical: Not on file    Non-medical: Not on file  Tobacco Use  . Smoking status: Current Every Day Smoker    Packs/day: 0.25    Years: 36.00    Pack years: 9.00    Types: Cigarettes  . Smokeless tobacco: Never Used  Substance and Sexual Activity  . Alcohol use: Not Currently    Alcohol/week: 0.0 standard drinks  . Drug use: No  . Sexual activity: Yes    Birth control/protection: Post-menopausal    Comment: 1st intercourse 53 yo-More than 5 partners  Lifestyle  . Physical activity    Days per week: Not on file    Minutes per session: Not on file  . Stress: Not on file  Relationships  . Social Herbalist on phone: Not on file    Gets together: Not on file    Attends religious service: Not on file    Active member of club or organization: Not on file    Attends meetings of clubs or organizations: Not on file    Relationship status: Not on file  . Intimate partner violence    Fear of current or ex partner: Not  on file    Emotionally abused: Not on file    Physically abused: Not on file    Forced sexual activity: Not on file  Other Topics Concern  . Not on file  Social History Narrative  . Not on file    Past Surgical History:  Procedure Laterality Date  . ABDOMINAL HERNIA REPAIR  2007; 06/05/2017   Suprapubic, Surgery 2007 with mesh VHR w/mesh; OPEN VENTRAL HERNIA REPAIR WITH MESH  . [place]; 1995; 1998  . ESOPHAGOGASTRODUODENOSCOPY  01/2017  . HERNIA REPAIR  2007; 06/05/2017  . INSERTION OF MESH N/A 06/05/2017   Procedure: INSERTION OF MESH;  Surgeon: Donnie Mesa, MD;  Location: [facility];  Service: General;  Laterality: N/A;  . VENTRAL HERNIA REPAIR N/A 06/05/2017   Procedure: OPEN VENTRAL HERNIA REPAIR WITH MESH;   Surgeon: Donnie Mesa, MD;  Location: [facility];  Service: General;  Laterality: N/A;    Family History  Problem Relation Age of Onset  . Thyroid disease Mother   . Atrial fibrillation Mother   . Hyperlipidemia Mother   . Hyperlipidemia Sister   . Breast cancer Sister   . Hyperlipidemia Brother   . Thyroid disease Maternal Grandmother   . Glaucoma Maternal Grandmother   . Cataracts Maternal Grandmother   . Esophageal cancer Maternal Grandfather   . Hypertension Maternal Uncle   . Obesity Maternal Uncle   . Allergies Son        Hay Fever  . Healthy Daughter   . Colon cancer Neg Hx   . Colon polyps Neg Hx   . Stomach cancer Neg Hx   . Rectal cancer Neg Hx     No Known Allergies  Current Outpatient Medications on File Prior to Visit  Medication Sig Dispense Refill  . esomeprazole (NEXIUM) 20 MG capsule Take 20 mg by mouth daily as needed.     . nitroGLYCERIN (NITRODUR - DOSED IN MG/24 HR) 0.2 mg/hr patch Cut and apply 1/4 patch to most painful area q24h. 30 patch 11   No current facility-administered medications on file prior to visit.     BP 118/71   Pulse 81   Temp 97.9 F (36.6 C) (Oral)   Resp 16   Ht 5\' 7"  (1.702 m)   Wt 239 lb 3.2 oz (108.5 kg)   SpO2 98%   BMI 37.46 kg/m       Objective:   Physical Exam  General Mental Status- Alert. General Appearance- Not in acute distress.   Skin General: Color- Normal Color. Moisture- Normal Moisture. No worrisome lesions. A few tiny small tiny 1-2 mm moles at best.  Neck Carotid Arteries- Normal color. Moisture- Normal Moisture. No carotid bruits. No JVD.  Chest and Lung Exam Auscultation: Breath Sounds:-Normal.  Cardiovascular Auscultation:Rythm- Regular. Murmurs & Other Heart Sounds:Auscultation of the heart reveals- No Murmurs.  Abdomen Inspection:-Inspeection Normal. Palpation/Percussion:Note:No mass. Palpation and Percussion of the abdomen reveal- Non Tender, Non Distended + BS, no rebound or  guarding.    Neurologic Cranial Nerve exam:- CN III-XII intact(No nystagmus), symmetric smile. Strength:- 5/5 equal and symmetric strength both upper and lower extremities.  heent exam- negative but mild red rt tm.       Assessment & Plan:  For you wellness exam today I have ordered cbc, cmp, and lipid panel.  Flu vaccine declined.  Mammogram done through her employer. Pt will get me copy of report.  Pt making appointment with Gyn for repeat pap.  Pt up to date  on colonoscopy.  Recommend exercise and healthy diet.  We will let you know lab results as they come in.  Follow up date appointment will be determined after lab review  Pt mild ha likely nitro patch related as sports med explained. But if ha persist or worsen advised let me know.  Advised on mild red rt tm. If any pain or discomfort let me know and would rx antibiotic.  Mackie Pai, PA-C

## 2018-12-03 NOTE — Patient Instructions (Addendum)
For you wellness exam today I have ordered cbc, cmp, and lipid panel.  Flu vaccine declined.  Mammogram done through her employer. Pt will get me copy of report.  Pt making appointment with Gyn for repeat pap.  Pt up to date on colonoscopy.  Recommend exercise and healthy diet.  We will let you know lab results as they come in.  Follow up date appointment will be determined after lab review.    Preventive Care 16-51 Years Old, Female Preventive care refers to visits with your health care provider and lifestyle choices that can promote health and wellness. This includes:  A yearly physical exam. This may also be called an annual well check.  Regular dental visits and eye exams.  Immunizations.  Screening for certain conditions.  Healthy lifestyle choices, such as eating a healthy diet, getting regular exercise, not using drugs or products that contain nicotine and tobacco, and limiting alcohol use. What can I expect for my preventive care visit? Physical exam Your health care provider will check your:  Height and weight. This may be used to calculate body mass index (BMI), which tells if you are at a healthy weight.  Heart rate and blood pressure.  Skin for abnormal spots. Counseling Your health care provider may ask you questions about your:  Alcohol, tobacco, and drug use.  Emotional well-being.  Home and relationship well-being.  Sexual activity.  Eating habits.  Work and work Statistician.  Method of birth control.  Menstrual cycle.  Pregnancy history. What immunizations do I need?  Influenza (flu) vaccine  This is recommended every year. Tetanus, diphtheria, and pertussis (Tdap) vaccine  You may need a Td booster every 10 years. Varicella (chickenpox) vaccine  You may need this if you have not been vaccinated. Zoster (shingles) vaccine  You may need this after age 60. Measles, mumps, and rubella (MMR) vaccine  You may need at least one dose  of MMR if you were born in 1957 or later. You may also need a second dose. Pneumococcal conjugate (PCV13) vaccine  You may need this if you have certain conditions and were not previously vaccinated. Pneumococcal polysaccharide (PPSV23) vaccine  You may need one or two doses if you smoke cigarettes or if you have certain conditions. Meningococcal conjugate (MenACWY) vaccine  You may need this if you have certain conditions. Hepatitis A vaccine  You may need this if you have certain conditions or if you travel or work in places where you may be exposed to hepatitis A. Hepatitis B vaccine  You may need this if you have certain conditions or if you travel or work in places where you may be exposed to hepatitis B. Haemophilus influenzae type b (Hib) vaccine  You may need this if you have certain conditions. Human papillomavirus (HPV) vaccine  If recommended by your health care provider, you may need three doses over 6 months. You may receive vaccines as individual doses or as more than one vaccine together in one shot (combination vaccines). Talk with your health care provider about the risks and benefits of combination vaccines. What tests do I need? Blood tests  Lipid and cholesterol levels. These may be checked every 5 years, or more frequently if you are over 93 years old.  Hepatitis C test.  Hepatitis B test. Screening  Lung cancer screening. You may have this screening every year starting at age 26 if you have a 30-pack-year history of smoking and currently smoke or have quit within the past 15  years.  Colorectal cancer screening. All adults should have this screening starting at age 43 and continuing until age 58. Your health care provider may recommend screening at age 77 if you are at increased risk. You will have tests every 1-10 years, depending on your results and the type of screening test.  Diabetes screening. This is done by checking your blood sugar (glucose) after  you have not eaten for a while (fasting). You may have this done every 1-3 years.  Mammogram. This may be done every 1-2 years. Talk with your health care provider about when you should start having regular mammograms. This may depend on whether you have a family history of breast cancer.  BRCA-related cancer screening. This may be done if you have a family history of breast, ovarian, tubal, or peritoneal cancers.  Pelvic exam and Pap test. This may be done every 3 years starting at age 5. Starting at age 74, this may be done every 5 years if you have a Pap test in combination with an HPV test. Other tests  Sexually transmitted disease (STD) testing.  Bone density scan. This is done to screen for osteoporosis. You may have this scan if you are at high risk for osteoporosis. Follow these instructions at home: Eating and drinking  Eat a diet that includes fresh fruits and vegetables, whole grains, lean protein, and low-fat dairy.  Take vitamin and mineral supplements as recommended by your health care provider.  Do not drink alcohol if: ? Your health care provider tells you not to drink. ? You are pregnant, may be pregnant, or are planning to become pregnant.  If you drink alcohol: ? Limit how much you have to 0-1 drink a day. ? Be aware of how much alcohol is in your drink. In the [place] one drink equals one 12 oz bottle of beer (355 mL), one 5 oz glass of wine (148 mL), or one 1 oz glass of hard liquor (44 mL). Lifestyle  Take daily care of your teeth and gums.  Stay active. Exercise for at least 30 minutes on 5 or more days each week.  Do not use any products that contain nicotine or tobacco, such as cigarettes, e-cigarettes, and chewing tobacco. If you need help quitting, ask your health care provider.  If you are sexually active, practice safe sex. Use a condom or other form of birth control (contraception) in order to prevent pregnancy and STIs (sexually transmitted  infections).  If told by your health care provider, take low-dose aspirin daily starting at age 54. What's next?  Visit your health care provider once a year for a well check visit.  Ask your health care provider how often you should have your eyes and teeth checked.  Stay up to date on all vaccines. This information is not intended to replace advice given to you by your health care provider. Make sure you discuss any questions you have with your health care provider. Document Released: 04/23/2015 Document Revised: 12/06/2017 Document Reviewed: 12/06/2017 Elsevier Patient Education  2020 [employer].

## 2018-12-05 DIAGNOSIS — F331 Major depressive disorder, recurrent, moderate: Secondary | ICD-10-CM | POA: Diagnosis not present

## 2018-12-09 ENCOUNTER — Encounter: Payer: Self-pay | Admitting: Medical

## 2018-12-09 ENCOUNTER — Other Ambulatory Visit: Payer: Self-pay

## 2018-12-09 ENCOUNTER — Other Ambulatory Visit (INDEPENDENT_AMBULATORY_CARE_PROVIDER_SITE_OTHER): Payer: BC Managed Care – PPO

## 2018-12-09 ENCOUNTER — Encounter: Payer: Self-pay | Admitting: Family Medicine

## 2018-12-09 DIAGNOSIS — Z0000 Encounter for general adult medical examination without abnormal findings: Secondary | ICD-10-CM

## 2018-12-09 LAB — LIPID PANEL
Cholesterol: 232 mg/dL — ABNORMAL HIGH (ref 0–200)
HDL: 46.2 mg/dL (ref >39.00)
LDL Cholesterol: 166 mg/dL — ABNORMAL HIGH (ref 0–99)
NonHDL: 185.53
Total CHOL/HDL Ratio: 5
Triglycerides: 96 mg/dL (ref 0.0–149.0)
VLDL: 19.2 mg/dL (ref 0.0–40.0)

## 2018-12-09 LAB — CBC WITH DIFFERENTIAL/PLATELET
Basophils Absolute: 0.1 10*3/uL (ref 0.0–0.1)
Basophils Relative: 0.9 % (ref 0.0–3.0)
Eosinophils Absolute: 0.3 10*3/uL (ref 0.0–0.7)
Eosinophils Relative: 2.2 % (ref 0.0–5.0)
HCT: 39.7 % (ref 36.0–46.0)
Hemoglobin: 13 g/dL (ref 12.0–15.0)
Lymphocytes Relative: 34.2 % (ref 12.0–46.0)
Lymphs Abs: 4 10*3/uL (ref 0.7–4.0)
MCHC: 32.9 g/dL (ref 30.0–36.0)
MCV: 88.9 fl (ref 78.0–100.0)
Monocytes Absolute: 0.7 10*3/uL (ref 0.1–1.0)
Monocytes Relative: 6.3 % (ref 3.0–12.0)
Neutro Abs: 6.6 10*3/uL (ref 1.4–7.7)
Neutrophils Relative %: 56.4 % (ref 43.0–77.0)
Platelets: 413 10*3/uL — ABNORMAL HIGH (ref 150.0–400.0)
RBC: 4.47 Mil/uL (ref 3.87–5.11)
RDW: 14.9 % (ref 11.5–15.5)
WBC: 11.6 10*3/uL — ABNORMAL HIGH (ref 4.0–10.5)

## 2018-12-09 LAB — COMPREHENSIVE METABOLIC PANEL
ALT: 8 U/L (ref 0–35)
AST: 10 U/L (ref 0–37)
Albumin: 4.1 g/dL (ref 3.5–5.2)
Alkaline Phosphatase: 99 U/L (ref 39–117)
BUN: 16 mg/dL (ref 6–23)
CO2: 28 mEq/L (ref 19–32)
Calcium: 9.6 mg/dL (ref 8.4–10.5)
Chloride: 104 mEq/L (ref 96–112)
Creatinine, Ser: 0.69 mg/dL (ref 0.40–1.20)
GFR: 108.43 mL/min (ref >60.00)
Glucose, Bld: 89 mg/dL (ref 70–99)
Potassium: 4.7 mEq/L (ref 3.5–5.1)
Sodium: 140 mEq/L (ref 135–145)
Total Bilirubin: 0.3 mg/dL (ref 0.2–1.2)
Total Protein: 6.7 g/dL (ref 6.0–8.3)

## 2018-12-09 NOTE — Progress Notes (Signed)
la 

## 2018-12-19 DIAGNOSIS — F331 Major depressive disorder, recurrent, moderate: Secondary | ICD-10-CM | POA: Diagnosis not present

## 2018-12-30 ENCOUNTER — Other Ambulatory Visit: Payer: Self-pay

## 2018-12-30 ENCOUNTER — Encounter: Payer: Self-pay | Admitting: Family Medicine

## 2018-12-30 ENCOUNTER — Ambulatory Visit: Payer: BC Managed Care – PPO | Admitting: Family Medicine

## 2018-12-30 DIAGNOSIS — M7532 Calcific tendinitis of left shoulder: Secondary | ICD-10-CM | POA: Diagnosis not present

## 2018-12-30 NOTE — Assessment & Plan Note (Signed)
Has had improvement with the injection and nitroglycerin patches.  Pain is mild and intermittent at this point. -Can continue nitro for couple more months. -Counseled on home exercise therapy and supportive care. -Can follow-up in 2 months.  Can consider stopping nitro at that point.  If no improvement can consider physical therapy or MRI.

## 2018-12-30 NOTE — Progress Notes (Signed)
Jasmin Stewart - 53 y.o. female MRN [id number]  Date of birth: 1965/08/22  SUBJECTIVE:  Including CC & ROS.  Chief Complaint  Patient presents with  . Follow-up    follow up for left shoulder    Jasmin Stewart is a 53 y.o. female that is following up for her left shoulder pain.  An injection was performed on 8/24.  She has been using the nitroglycerin patches since that time as well.  She just finished moving.  Pain is intermittent in nature and mild.  Does not notice it as often as before.  Seems to be improved for the most part.  Has tried home exercise therapy as well..    Review of Systems  Constitutional: Negative for fever.  HENT: Negative for congestion.   Respiratory: Negative for cough.   Cardiovascular: Negative for chest pain.  Gastrointestinal: Negative for abdominal pain.  Musculoskeletal: Negative for back pain.  Neurological: Negative for weakness.  Hematological: Negative for adenopathy.    HISTORY: Past Medical, Surgical, Social, and Family History Reviewed & Updated per EMR.   Pertinent Historical Findings include:  Past Medical History:  Diagnosis Date  . Anemia   . Asthma   . Fibroid, uterine    never had surgery  . History of blood transfusion 1991; 1995   related to childbirth  . History of chicken pox    Age 75  . Hypercholesteremia   . Hypoglycemia   . Hypokalemia   . Pneumonia 2013,2014, 2018  . PVC (premature ventricular contraction)   . Ulcer of esophagus     Past Surgical History:  Procedure Laterality Date  . ABDOMINAL HERNIA REPAIR  2007; 06/05/2017   Suprapubic, Surgery 2007 with mesh VHR w/mesh; OPEN VENTRAL HERNIA REPAIR WITH MESH  . [place]; 1995; 1998  . ESOPHAGOGASTRODUODENOSCOPY  01/2017  . HERNIA REPAIR  2007; 06/05/2017  . INSERTION OF MESH N/A 06/05/2017   Procedure: INSERTION OF MESH;  Surgeon: Donnie Mesa, MD;  Location: [facility];  Service: General;  Laterality: N/A;  . VENTRAL HERNIA REPAIR N/A 06/05/2017   Procedure: OPEN VENTRAL HERNIA REPAIR WITH MESH;  Surgeon: Donnie Mesa, MD;  Location: [facility];  Service: General;  Laterality: N/A;    No Known Allergies  Family History  Problem Relation Age of Onset  . Thyroid disease Mother   . Atrial fibrillation Mother   . Hyperlipidemia Mother   . Hyperlipidemia Sister   . Breast cancer Sister   . Hyperlipidemia Brother   . Thyroid disease Maternal Grandmother   . Glaucoma Maternal Grandmother   . Cataracts Maternal Grandmother   . Esophageal cancer Maternal Grandfather   . Hypertension Maternal Uncle   . Obesity Maternal Uncle   . Allergies Son        Hay Fever  . Healthy Daughter   . Colon cancer Neg Hx   . Colon polyps Neg Hx   . Stomach cancer Neg Hx   . Rectal cancer Neg Hx      Social History   Socioeconomic History  . Marital status: Single    Spouse name: Not on file  . Number of children: Not on file  . Years of education: Not on file  . Highest education level: Not on file  Occupational History  . Not on file  Social Needs  . Financial resource strain: Not on file  . Food insecurity    Worry: Not on file    Inability: Not on file  .  Transportation needs    Medical: Not on file    Non-medical: Not on file  Tobacco Use  . Smoking status: Current Every Day Smoker    Packs/day: 0.25    Years: 36.00    Pack years: 9.00    Types: Cigarettes  . Smokeless tobacco: Never Used  Substance and Sexual Activity  . Alcohol use: Not Currently    Alcohol/week: 0.0 standard drinks  . Drug use: No  . Sexual activity: Yes    Birth control/protection: Post-menopausal    Comment: 1st intercourse 53 yo-More than 5 partners  Lifestyle  . Physical activity    Days per week: Not on file    Minutes per session: Not on file  . Stress: Not on file  Relationships  . Social Herbalist on phone: Not on file    Gets together: Not on file    Attends religious service: Not on file    Active member of club or  organization: Not on file    Attends meetings of clubs or organizations: Not on file    Relationship status: Not on file  . Intimate partner violence    Fear of current or ex partner: Not on file    Emotionally abused: Not on file    Physically abused: Not on file    Forced sexual activity: Not on file  Other Topics Concern  . Not on file  Social History Narrative  . Not on file     PHYSICAL EXAM:  VS: Ht 5\' 7"  (1.702 m)   Wt 239 lb (108.4 kg)   BMI 37.43 kg/m  Physical Exam Gen: NAD, alert, cooperative with exam, well-appearing ENT: normal lips, normal nasal mucosa,  Eye: normal EOM, normal conjunctiva and lids CV:  no edema, +2 pedal pulses   Resp: no accessory muscle use, non-labored,  Skin: no rashes, no areas of induration  Neuro: normal tone, normal sensation to touch Psych:  normal insight, alert and oriented MSK:  Left shoulder: Normal active flexion and abduction. Normal internal and external rotation. Mild pain with empty can testing. Negative speeds test. Neurovascular intact     ASSESSMENT & PLAN:   Calcific tendinitis of left shoulder Has had improvement with the injection and nitroglycerin patches.  Pain is mild and intermittent at this point. -Can continue nitro for couple more months. -Counseled on home exercise therapy and supportive care. -Can follow-up in 2 months.  Can consider stopping nitro at that point.  If no improvement can consider physical therapy or MRI.

## 2019-01-01 ENCOUNTER — Encounter: Payer: Self-pay | Admitting: Gynecology

## 2019-01-16 DIAGNOSIS — F331 Major depressive disorder, recurrent, moderate: Secondary | ICD-10-CM | POA: Diagnosis not present

## 2019-01-22 DIAGNOSIS — Z1231 Encounter for screening mammogram for malignant neoplasm of breast: Secondary | ICD-10-CM | POA: Diagnosis not present

## 2019-01-30 DIAGNOSIS — F331 Major depressive disorder, recurrent, moderate: Secondary | ICD-10-CM | POA: Diagnosis not present

## 2019-02-27 ENCOUNTER — Ambulatory Visit: Payer: BC Managed Care – PPO | Admitting: Family Medicine

## 2019-02-27 ENCOUNTER — Other Ambulatory Visit: Payer: Self-pay

## 2019-02-27 ENCOUNTER — Encounter: Payer: Self-pay | Admitting: Family Medicine

## 2019-02-27 DIAGNOSIS — M7532 Calcific tendinitis of left shoulder: Secondary | ICD-10-CM

## 2019-02-27 NOTE — Progress Notes (Signed)
Jasmin Stewart - 53 y.o. female MRN [id number]  Date of birth: November 19, 1965  SUBJECTIVE:  Including CC & ROS.  Chief Complaint  Patient presents with  . Follow-up    follow up for left shoulder    Jasmin Stewart is a 53 y.o. female that is following up for her left shoulder pain.  The pain is improved.  She is completed nitro patches and home exercise therapy.  Has tried an injection as well.  She does not notice the pain on a regular basis.  Feels like she is back to normal.    Review of Systems  Constitutional: Negative for fever.  HENT: Negative for congestion.   Respiratory: Negative for cough.   Cardiovascular: Negative for chest pain.  Gastrointestinal: Negative for abdominal pain.  Musculoskeletal: Negative for gait problem.  Skin: Negative for color change.  Neurological: Negative for weakness.  Hematological: Negative for adenopathy.    HISTORY: Past Medical, Surgical, Social, and Family History Reviewed & Updated per EMR.   Pertinent Historical Findings include:  Past Medical History:  Diagnosis Date  . Anemia   . Asthma   . Fibroid, uterine    never had surgery  . History of blood transfusion 1991; 1995   related to childbirth  . History of chicken pox    Age 36  . Hypercholesteremia   . Hypoglycemia   . Hypokalemia   . Pneumonia 2013,2014, 2018  . PVC (premature ventricular contraction)   . Ulcer of esophagus     Past Surgical History:  Procedure Laterality Date  . ABDOMINAL HERNIA REPAIR  2007; 06/05/2017   Suprapubic, Surgery 2007 with mesh VHR w/mesh; OPEN VENTRAL HERNIA REPAIR WITH MESH  . [place]; 1995; 1998  . ESOPHAGOGASTRODUODENOSCOPY  01/2017  . HERNIA REPAIR  2007; 06/05/2017  . INSERTION OF MESH N/A 06/05/2017   Procedure: INSERTION OF MESH;  Surgeon: Donnie Mesa, MD;  Location: [facility];  Service: General;  Laterality: N/A;  . VENTRAL HERNIA REPAIR N/A 06/05/2017   Procedure: OPEN VENTRAL HERNIA REPAIR WITH MESH;  Surgeon: Donnie Mesa, MD;  Location: [facility];  Service: General;  Laterality: N/A;    No Known Allergies  Family History  Problem Relation Age of Onset  . Thyroid disease Mother   . Atrial fibrillation Mother   . Hyperlipidemia Mother   . Hyperlipidemia Sister   . Breast cancer Sister   . Hyperlipidemia Brother   . Thyroid disease Maternal Grandmother   . Glaucoma Maternal Grandmother   . Cataracts Maternal Grandmother   . Esophageal cancer Maternal Grandfather   . Hypertension Maternal Uncle   . Obesity Maternal Uncle   . Allergies Son        Hay Fever  . Healthy Daughter   . Colon cancer Neg Hx   . Colon polyps Neg Hx   . Stomach cancer Neg Hx   . Rectal cancer Neg Hx      Social History   Socioeconomic History  . Marital status: Single    Spouse name: Not on file  . Number of children: Not on file  . Years of education: Not on file  . Highest education level: Not on file  Occupational History  . Not on file  Social Needs  . Financial resource strain: Not on file  . Food insecurity    Worry: Not on file    Inability: Not on file  . Transportation needs    Medical: Not on file  Non-medical: Not on file  Tobacco Use  . Smoking status: Current Every Day Smoker    Packs/day: 0.25    Years: 36.00    Pack years: 9.00    Types: Cigarettes  . Smokeless tobacco: Never Used  Substance and Sexual Activity  . Alcohol use: Not Currently    Alcohol/week: 0.0 standard drinks  . Drug use: No  . Sexual activity: Yes    Birth control/protection: Post-menopausal    Comment: 1st intercourse 53 yo-More than 5 partners  Lifestyle  . Physical activity    Days per week: Not on file    Minutes per session: Not on file  . Stress: Not on file  Relationships  . Social Herbalist on phone: Not on file    Gets together: Not on file    Attends religious service: Not on file    Active member of club or organization: Not on file    Attends meetings of clubs or organizations: Not  on file    Relationship status: Not on file  . Intimate partner violence    Fear of current or ex partner: Not on file    Emotionally abused: Not on file    Physically abused: Not on file    Forced sexual activity: Not on file  Other Topics Concern  . Not on file  Social History Narrative  . Not on file     PHYSICAL EXAM:  VS: BP 119/75   Pulse 80   Ht 5\' 7"  (1.702 m)   Wt 237 lb (107.5 kg)   BMI 37.12 kg/m  Physical Exam Gen: NAD, alert, cooperative with exam, well-appearing ENT: normal lips, normal nasal mucosa,  Eye: normal EOM, normal conjunctiva and lids CV:  no edema, +2 pedal pulses   Resp: no accessory muscle use, non-labored,  Skin: no rashes, no areas of induration  Neuro: normal tone, normal sensation to touch Psych:  normal insight, alert and oriented MSK:  Left shoulder:  Normal active flexion abduction. Normal internal and external rotation. Normal strength resistance. Normal empty can testing. Neurovascularly intact     ASSESSMENT & PLAN:   Calcific tendinitis of left shoulder She feels back to her normal self. -Counseled on home exercise therapy and supportive care. -Can follow-up as needed.

## 2019-02-27 NOTE — Assessment & Plan Note (Signed)
She feels back to her normal self. -Counseled on home exercise therapy and supportive care. -Can follow-up as needed.

## 2019-04-09 DIAGNOSIS — Z20828 Contact with and (suspected) exposure to other viral communicable diseases: Secondary | ICD-10-CM | POA: Diagnosis not present

## 2019-04-16 ENCOUNTER — Encounter: Payer: Self-pay | Admitting: Family Medicine

## 2019-04-16 DIAGNOSIS — L600 Ingrowing nail: Secondary | ICD-10-CM | POA: Diagnosis not present

## 2019-04-16 DIAGNOSIS — M79671 Pain in right foot: Secondary | ICD-10-CM | POA: Diagnosis not present

## 2019-04-23 DIAGNOSIS — Z20828 Contact with and (suspected) exposure to other viral communicable diseases: Secondary | ICD-10-CM | POA: Diagnosis not present

## 2019-04-29 DIAGNOSIS — Z20822 Contact with and (suspected) exposure to covid-19: Secondary | ICD-10-CM | POA: Diagnosis not present

## 2019-05-16 ENCOUNTER — Ambulatory Visit (INDEPENDENT_AMBULATORY_CARE_PROVIDER_SITE_OTHER): Payer: BC Managed Care – PPO | Admitting: Medical

## 2019-05-16 ENCOUNTER — Encounter: Payer: Self-pay | Admitting: Medical

## 2019-05-16 ENCOUNTER — Other Ambulatory Visit: Payer: Self-pay

## 2019-05-16 VITALS — BP 130/73 | HR 78 | Ht 67.0 in | Wt 242.0 lb

## 2019-05-16 DIAGNOSIS — F17200 Nicotine dependence, unspecified, uncomplicated: Secondary | ICD-10-CM

## 2019-05-16 DIAGNOSIS — E049 Nontoxic goiter, unspecified: Secondary | ICD-10-CM

## 2019-05-16 DIAGNOSIS — R05 Cough: Secondary | ICD-10-CM

## 2019-05-16 DIAGNOSIS — Z0000 Encounter for general adult medical examination without abnormal findings: Secondary | ICD-10-CM | POA: Diagnosis not present

## 2019-05-16 DIAGNOSIS — R0982 Postnasal drip: Secondary | ICD-10-CM

## 2019-05-16 DIAGNOSIS — E785 Hyperlipidemia, unspecified: Secondary | ICD-10-CM

## 2019-05-16 DIAGNOSIS — R059 Cough, unspecified: Secondary | ICD-10-CM

## 2019-05-16 DIAGNOSIS — J029 Acute pharyngitis, unspecified: Secondary | ICD-10-CM

## 2019-05-16 DIAGNOSIS — R5383 Other fatigue: Secondary | ICD-10-CM

## 2019-05-16 DIAGNOSIS — E0789 Other specified disorders of thyroid: Secondary | ICD-10-CM

## 2019-05-16 NOTE — Patient Instructions (Addendum)
For you wellness exam today I have ordered cbc, cmp, tsh,t4 and lipid panel.  Offer flu when in for labs. Future labs placed and asking to get scheduled next week.  Recommend exercise and healthy diet.  We will let you know lab results as they come in.  For hx of cough and smoking will get cxr.  Pt sore throat was more pain directly over thyroid. No covid related symptoms. Will get thyroid test and examine in person on day in for lab. Maybe order [place]  Pt is going to order bp cuff and send me my chart update so I can update her vitals today.  Follow up date appointment will be determined after lab review.    Preventive Care 58-33 Years Old, Female Preventive care refers to visits with your health care provider and lifestyle choices that can promote health and wellness. This includes:  A yearly physical exam. This may also be called an annual well check.  Regular dental visits and eye exams.  Immunizations.  Screening for certain conditions.  Healthy lifestyle choices, such as eating a healthy diet, getting regular exercise, not using drugs or products that contain nicotine and tobacco, and limiting alcohol use. What can I expect for my preventive care visit? Physical exam Your health care provider will check your:  Height and weight. This may be used to calculate body mass index (BMI), which tells if you are at a healthy weight.  Heart rate and blood pressure.  Skin for abnormal spots. Counseling Your health care provider may ask you questions about your:  Alcohol, tobacco, and drug use.  Emotional well-being.  Home and relationship well-being.  Sexual activity.  Eating habits.  Work and work Statistician.  Method of birth control.  Menstrual cycle.  Pregnancy history. What immunizations do I need?  Influenza (flu) vaccine  This is recommended every year. Tetanus, diphtheria, and pertussis (Tdap) vaccine  You may need a Td booster every 10  years. Varicella (chickenpox) vaccine  You may need this if you have not been vaccinated. Zoster (shingles) vaccine  You may need this after age 81. Measles, mumps, and rubella (MMR) vaccine  You may need at least one dose of MMR if you were born in 1957 or later. You may also need a second dose. Pneumococcal conjugate (PCV13) vaccine  You may need this if you have certain conditions and were not previously vaccinated. Pneumococcal polysaccharide (PPSV23) vaccine  You may need one or two doses if you smoke cigarettes or if you have certain conditions. Meningococcal conjugate (MenACWY) vaccine  You may need this if you have certain conditions. Hepatitis A vaccine  You may need this if you have certain conditions or if you travel or work in places where you may be exposed to hepatitis A. Hepatitis B vaccine  You may need this if you have certain conditions or if you travel or work in places where you may be exposed to hepatitis B. Haemophilus influenzae type b (Hib) vaccine  You may need this if you have certain conditions. Human papillomavirus (HPV) vaccine  If recommended by your health care provider, you may need three doses over 6 months. You may receive vaccines as individual doses or as more than one vaccine together in one shot (combination vaccines). Talk with your health care provider about the risks and benefits of combination vaccines. What tests do I need? Blood tests  Lipid and cholesterol levels. These may be checked every 5 years, or more frequently if you are  over 71 years old.  Hepatitis C test.  Hepatitis B test. Screening  Lung cancer screening. You may have this screening every year starting at age 18 if you have a 30-pack-year history of smoking and currently smoke or have quit within the past 15 years.  Colorectal cancer screening. All adults should have this screening starting at age 41 and continuing until age 5. Your health care provider may  recommend screening at age 78 if you are at increased risk. You will have tests every 1-10 years, depending on your results and the type of screening test.  Diabetes screening. This is done by checking your blood sugar (glucose) after you have not eaten for a while (fasting). You may have this done every 1-3 years.  Mammogram. This may be done every 1-2 years. Talk with your health care provider about when you should start having regular mammograms. This may depend on whether you have a family history of breast cancer.  BRCA-related cancer screening. This may be done if you have a family history of breast, ovarian, tubal, or peritoneal cancers.  Pelvic exam and Pap test. This may be done every 3 years starting at age 70. Starting at age 52, this may be done every 5 years if you have a Pap test in combination with an HPV test. Other tests  Sexually transmitted disease (STD) testing.  Bone density scan. This is done to screen for osteoporosis. You may have this scan if you are at high risk for osteoporosis. Follow these instructions at home: Eating and drinking  Eat a diet that includes fresh fruits and vegetables, whole grains, lean protein, and low-fat dairy.  Take vitamin and mineral supplements as recommended by your health care provider.  Do not drink alcohol if: ? Your health care provider tells you not to drink. ? You are pregnant, may be pregnant, or are planning to become pregnant.  If you drink alcohol: ? Limit how much you have to 0-1 drink a day. ? Be aware of how much alcohol is in your drink. In the [place] one drink equals one 12 oz bottle of beer (355 mL), one 5 oz glass of wine (148 mL), or one 1 oz glass of hard liquor (44 mL). Lifestyle  Take daily care of your teeth and gums.  Stay active. Exercise for at least 30 minutes on 5 or more days each week.  Do not use any products that contain nicotine or tobacco, such as cigarettes, e-cigarettes, and chewing tobacco. If  you need help quitting, ask your health care provider.  If you are sexually active, practice safe sex. Use a condom or other form of birth control (contraception) in order to prevent pregnancy and STIs (sexually transmitted infections).  If told by your health care provider, take low-dose aspirin daily starting at age 27. What's next?  Visit your health care provider once a year for a well check visit.  Ask your health care provider how often you should have your eyes and teeth checked.  Stay up to date on all vaccines. This information is not intended to replace advice given to you by your health care provider. Make sure you discuss any questions you have with your health care provider. Document Revised: 12/06/2017 Document Reviewed: 12/06/2017 Elsevier Patient Education  2020 [employer].

## 2019-05-16 NOTE — Progress Notes (Addendum)
Subjective:    Patient ID: Jasmin Stewart, female    DOB: December 22, 1965, 54 y.o.   MRN: [id number]  HPI  Virtual Visit via Video Note  I connected with Jasmin Stewart on 05/16/19 at  2:40 PM EST by a video enabled telemedicine application and verified that I am speaking with the correct person using two identifiers.  Location: Patient: home Provider: office   I discussed the limitations of evaluation and management by telemedicine and the availability of in person appointments. The patient expressed understanding and agreed to proceed.  History of Present Illness:   On discussion pt did request update on maintenance/ cpe. So decided to go wellness today.  Pt last colonoscopy feb 2020. polps found and repeat in 2023.  She states up to date on mammogram  Flu vaccine not up to date will see if she want to get on day in for fasting labs.   She is still smoking. Random/occasional cough.   Pt in with some recent st.  Pt boss got covid. Pt has 3 covid test over past 3 negative covid test in past 3 weeks. Last covid test was on January 15 th.(pt boss tested positive early January)  Pt had throat pain for about lat 3 week. Pt st finally resolved on Tuesday.   When had pain was actually more over thyroid. Does have some pnd as well.        Observations/Objective: General-no acute distress, pleasant, oriented. Lungs- on inspection lungs appear unlabored. Neck- no tracheal deviation or jvd on inspection. Neuro- gross motor function appears intact. heent- can see uvula and mild enlarged. Faint red.  In person today(05-27-2019) thyrid maybe borderline enlarged.  Assessment and Plan: For you wellness exam today I have ordered cbc, cmp, tsh,t4 and lipid panel.  Offer flu when in for labs. Future labs placed and asking to get scheduled next week.  Recommend exercise and healthy diet.  We will let you know lab results as they come in.  For hx of cough and smoking will get  cxr.  Pt sore throat was more pain directly over thyroid. No covid related symptoms. Will get thyroid test and examine in person on day in for lab. Maybe order [place]  Pt is going to order bp cuff and send me my chart update so I can update her vitals today.  Follow up date appointment will be determined after lab review.   20 additional minutes spent with pt after wellness exam.  Follow Up Instructions:    I discussed the assessment and treatment plan with the patient. The patient was provided an opportunity to ask questions and all were answered. The patient agreed with the plan and demonstrated an understanding of the instructions.   The patient was advised to call back or seek an in-person evaluation if the symptoms worsen or if the condition fails to improve as anticipated.     Mackie Pai, PA-C    Review of Systems  Constitutional: Negative for chills, fatigue and fever.  HENT: Positive for postnasal drip. Negative for congestion, drooling, ear discharge, sore throat, tinnitus, trouble swallowing and voice change.        During the st episode.  Respiratory: Negative for chest tightness and shortness of breath.   Cardiovascular: Negative for chest pain and palpitations.  Gastrointestinal: Negative for abdominal pain.  Musculoskeletal: Negative for arthralgias, back pain and myalgias.  Neurological: Negative for light-headedness.  Hematological: Negative for adenopathy. Does not bruise/bleed easily.  Psychiatric/Behavioral: Negative for  behavioral problems and confusion.       Objective:   Physical Exam        Assessment & Plan:

## 2019-05-19 ENCOUNTER — Encounter: Payer: Self-pay | Admitting: Medical

## 2019-05-27 ENCOUNTER — Other Ambulatory Visit (INDEPENDENT_AMBULATORY_CARE_PROVIDER_SITE_OTHER): Payer: BC Managed Care – PPO

## 2019-05-27 ENCOUNTER — Encounter: Payer: Self-pay | Admitting: Medical

## 2019-05-27 ENCOUNTER — Ambulatory Visit (HOSPITAL_BASED_OUTPATIENT_CLINIC_OR_DEPARTMENT_OTHER)
Admission: RE | Admit: 2019-05-27 | Discharge: 2019-05-27 | Disposition: A | Discharge status: Home / Self Care | Payer: BC Managed Care – PPO | Source: Ambulatory Visit | Attending: Medical | Admitting: Medical

## 2019-05-27 ENCOUNTER — Other Ambulatory Visit: Payer: Self-pay

## 2019-05-27 DIAGNOSIS — R059 Cough, unspecified: Secondary | ICD-10-CM

## 2019-05-27 DIAGNOSIS — R5383 Other fatigue: Secondary | ICD-10-CM

## 2019-05-27 DIAGNOSIS — E049 Nontoxic goiter, unspecified: Secondary | ICD-10-CM | POA: Diagnosis not present

## 2019-05-27 DIAGNOSIS — Z0000 Encounter for general adult medical examination without abnormal findings: Secondary | ICD-10-CM

## 2019-05-27 DIAGNOSIS — E785 Hyperlipidemia, unspecified: Secondary | ICD-10-CM | POA: Diagnosis not present

## 2019-05-27 DIAGNOSIS — F17200 Nicotine dependence, unspecified, uncomplicated: Secondary | ICD-10-CM | POA: Diagnosis not present

## 2019-05-27 DIAGNOSIS — R05 Cough: Secondary | ICD-10-CM | POA: Insufficient documentation

## 2019-05-27 DIAGNOSIS — E079 Disorder of thyroid, unspecified: Secondary | ICD-10-CM | POA: Diagnosis not present

## 2019-05-27 DIAGNOSIS — E0789 Other specified disorders of thyroid: Secondary | ICD-10-CM

## 2019-05-27 LAB — COMPREHENSIVE METABOLIC PANEL
ALT: 11 U/L (ref 0–35)
AST: 11 U/L (ref 0–37)
Albumin: 4.4 g/dL (ref 3.5–5.2)
Alkaline Phosphatase: 109 U/L (ref 39–117)
BUN: 17 mg/dL (ref 6–23)
CO2: 30 mEq/L (ref 19–32)
Calcium: 9.9 mg/dL (ref 8.4–10.5)
Chloride: 102 mEq/L (ref 96–112)
Creatinine, Ser: 0.67 mg/dL (ref 0.40–1.20)
GFR: 111.97 mL/min (ref >60.00)
Glucose, Bld: 99 mg/dL (ref 70–99)
Potassium: 4.7 mEq/L (ref 3.5–5.1)
Sodium: 138 mEq/L (ref 135–145)
Total Bilirubin: 0.4 mg/dL (ref 0.2–1.2)
Total Protein: 7.2 g/dL (ref 6.0–8.3)

## 2019-05-27 LAB — CBC WITH DIFFERENTIAL/PLATELET
Basophils Absolute: 0.1 10*3/uL (ref 0.0–0.1)
Basophils Relative: 0.9 % (ref 0.0–3.0)
Eosinophils Absolute: 0.2 10*3/uL (ref 0.0–0.7)
Eosinophils Relative: 1.4 % (ref 0.0–5.0)
HCT: 42.4 % (ref 36.0–46.0)
Hemoglobin: 14 g/dL (ref 12.0–15.0)
Lymphocytes Relative: 30.1 % (ref 12.0–46.0)
Lymphs Abs: 3.2 10*3/uL (ref 0.7–4.0)
MCHC: 33.1 g/dL (ref 30.0–36.0)
MCV: 89.2 fl (ref 78.0–100.0)
Monocytes Absolute: 0.6 10*3/uL (ref 0.1–1.0)
Monocytes Relative: 5.3 % (ref 3.0–12.0)
Neutro Abs: 6.7 10*3/uL (ref 1.4–7.7)
Neutrophils Relative %: 62.3 % (ref 43.0–77.0)
Platelets: 445 10*3/uL — ABNORMAL HIGH (ref 150.0–400.0)
RBC: 4.75 Mil/uL (ref 3.87–5.11)
RDW: 14.7 % (ref 11.5–15.5)
WBC: 10.7 10*3/uL — ABNORMAL HIGH (ref 4.0–10.5)

## 2019-05-27 LAB — LIPID PANEL
Cholesterol: 268 mg/dL — ABNORMAL HIGH (ref 0–200)
HDL: 48.5 mg/dL (ref >39.00)
LDL Cholesterol: 193 mg/dL — ABNORMAL HIGH (ref 0–99)
NonHDL: 219.8
Total CHOL/HDL Ratio: 6
Triglycerides: 134 mg/dL (ref 0.0–149.0)
VLDL: 26.8 mg/dL (ref 0.0–40.0)

## 2019-05-27 LAB — T4, FREE: Free T4: 0.81 ng/dL (ref 0.60–1.60)

## 2019-05-27 LAB — TSH: TSH: 1.64 u[IU]/mL (ref 0.35–4.50)

## 2019-05-27 NOTE — Addendum Note (Signed)
Addended by: Anabel Halon on: 05/27/2019 09:07 AM   Modules accepted: Orders

## 2019-05-28 ENCOUNTER — Telehealth: Payer: Self-pay | Admitting: Medical

## 2019-05-28 DIAGNOSIS — E669 Obesity, unspecified: Secondary | ICD-10-CM

## 2019-05-28 NOTE — Telephone Encounter (Signed)
Referral to weight management md placed.

## 2019-05-29 ENCOUNTER — Telehealth: Payer: Self-pay | Admitting: Medical

## 2019-05-29 MED ORDER — ATORVASTATIN CALCIUM 20 MG PO TABS: 20.0000 mg | ORAL_TABLET | Freq: Every day | ORAL | 1 refills | Status: DC

## 2019-05-29 NOTE — Telephone Encounter (Signed)
Rx atorvastatin sent to pt pharmacy. 

## 2019-06-16 DIAGNOSIS — Z20828 Contact with and (suspected) exposure to other viral communicable diseases: Secondary | ICD-10-CM | POA: Diagnosis not present

## 2019-07-21 ENCOUNTER — Encounter (INDEPENDENT_AMBULATORY_CARE_PROVIDER_SITE_OTHER): Payer: Self-pay

## 2019-07-29 ENCOUNTER — Telehealth: Payer: Self-pay | Admitting: Medical

## 2019-07-29 ENCOUNTER — Encounter: Payer: Self-pay | Admitting: Medical

## 2019-07-29 NOTE — Telephone Encounter (Signed)
I spoke with Jasmin Stewart and he said will see her virtual but she is unable to come into the office because of her cough, and I am speaking with the patient via mychart messages as well.

## 2019-07-29 NOTE — Telephone Encounter (Signed)
FYI...  Patient scheduled a MyChart appt for a really bad cough. I called her lvm for her to call back to make virtual. She called back in and insisting on Percell Miller saying she can be seen in the office. She stated she had the same cough in Feb. And now she has a new cough and wanted predisone called in. I offered to send the message to Percell Miller to see if he would call in meds for her. Patient still demanding she needed to be seen in the office because there was no way he could listen to her cough and hear her lungs on Mychart. She also mentioned that back in Feb Edward told her that he didn't know why she wasn't allowed to come in for her appt with a cough. I offered again to send Percell Miller the message & move appt up, but she declined and stated she was going to ED and to cancel the appointment should would let Percell Miller know that we denied her to come in once again.

## 2019-07-30 ENCOUNTER — Ambulatory Visit (INDEPENDENT_AMBULATORY_CARE_PROVIDER_SITE_OTHER): Payer: BC Managed Care – PPO | Admitting: Family Medicine

## 2019-07-30 ENCOUNTER — Encounter (INDEPENDENT_AMBULATORY_CARE_PROVIDER_SITE_OTHER): Payer: Self-pay | Admitting: Family Medicine

## 2019-07-30 ENCOUNTER — Other Ambulatory Visit: Payer: Self-pay

## 2019-07-30 VITALS — BP 124/69 | HR 96 | Temp 98.4°F | Ht 66.0 in | Wt 238.0 lb

## 2019-07-30 DIAGNOSIS — Z6838 Body mass index (BMI) 38.0-38.9, adult: Secondary | ICD-10-CM

## 2019-07-30 DIAGNOSIS — R0602 Shortness of breath: Secondary | ICD-10-CM | POA: Diagnosis not present

## 2019-07-30 DIAGNOSIS — R5383 Other fatigue: Secondary | ICD-10-CM

## 2019-07-30 DIAGNOSIS — Z9189 Other specified personal risk factors, not elsewhere classified: Secondary | ICD-10-CM | POA: Diagnosis not present

## 2019-07-30 DIAGNOSIS — Z0289 Encounter for other administrative examinations: Secondary | ICD-10-CM

## 2019-07-30 DIAGNOSIS — E7849 Other hyperlipidemia: Secondary | ICD-10-CM | POA: Diagnosis not present

## 2019-07-30 DIAGNOSIS — Z1331 Encounter for screening for depression: Secondary | ICD-10-CM

## 2019-07-30 DIAGNOSIS — Z720 Tobacco use: Secondary | ICD-10-CM

## 2019-07-30 DIAGNOSIS — R7309 Other abnormal glucose: Secondary | ICD-10-CM | POA: Diagnosis not present

## 2019-07-31 LAB — FOLATE: Folate: 7.3 ng/mL (ref >3.0)

## 2019-07-31 LAB — VITAMIN D 25 HYDROXY (VIT D DEFICIENCY, FRACTURES): Vit D, 25-Hydroxy: 16.8 ng/mL — ABNORMAL LOW (ref 30.0–100.0)

## 2019-07-31 LAB — INSULIN, RANDOM: INSULIN: 14.4 u[IU]/mL (ref 2.6–24.9)

## 2019-07-31 LAB — VITAMIN B12: Vitamin B-12: 373 pg/mL (ref 232–1245)

## 2019-07-31 LAB — HEMOGLOBIN A1C
Est. average glucose Bld gHb Est-mCnc: 128 mg/dL
Hgb A1c MFr Bld: 6.1 % — ABNORMAL HIGH (ref 4.8–5.6)

## 2019-07-31 NOTE — Progress Notes (Signed)
Dear Mackie Pai, PA-C,   Thank you for referring [name] to our clinic. The following note includes my evaluation and treatment recommendations.  Chief Complaint:   OBESITY Jasmin Stewart (MR# [id number]) is a 54 y.o. female who presents for evaluation and treatment of obesity and related comorbidities. Current BMI is Body mass index is 38.41 kg/m.Marland Kitchen Myrella has been struggling with her weight for many years and has been unsuccessful in either losing weight, maintaining weight loss, or reaching her healthy weight goal.  Icis is currently in the action stage of change and ready to dedicate time achieving and maintaining a healthier weight. Tammika is interested in becoming our patient and working on intensive lifestyle modifications including (but not limited to) diet and exercise for weight loss.  Morningstar reports occasionally skipping breakfast/dinner. For breakfast she has coffee with creamer and 2 packets of oatmeal (feels full); between 10 and 10:30 she will have handful of snacks; Lunch is a salad, order out ([employer] chicken sandwich, fries and drink - won't finish), or leftovers (feels full); occasionally will snack on nuts, granola bar, or snickers; Dinner will order out or have meat and beans. No after dinner eating.  Vaudine's habits were reviewed today and are as follows: Her family sometimes eats meals together, her desired weight loss is 38 lbs, she started gaining weight a few years back, her heaviest weight ever was 259 pounds, she craves chocolate at times, she skips breakfast/dinner sometimes, she is frequently drinking liquids with calories, she sometimes makes poor food choices, she has binge eating behaviors and she struggles with emotional eating.  Depression Screen Zondra's Food and Mood (modified PHQ-9) score was 4.  Depression screen [facility] 2/9 07/30/2019  Decreased Interest 0  Down, Depressed, Hopeless 1  PHQ - 2 Score 1  Altered sleeping 1  Tired, decreased energy 1  Change in  appetite 1  Feeling bad or failure about yourself  0  Trouble concentrating 0  Moving slowly or fidgety/restless 0  Suicidal thoughts 0  PHQ-9 Score 4  Difficult doing work/chores Not difficult at all   Subjective:   Other fatigue. Estle admits to daytime somnolence and admits to waking up still tired. Patent has a history of symptoms of daytime fatigue and Epworth sleepiness scale. Amari generally gets 4-5 hours of sleep per night, and states that she does not sleep well most nights. Apneic episodes are not present. Epworth Sleepiness Score is 11.  Shortness of breath on exertion. Kevyn notes increasing shortness of breath with exercising and seems to be worsening over time with weight gain. She notes getting out of breath sooner with activity than she used to. This has gotten worse recently. Heela denies shortness of breath at rest or orthopnea.  Other hyperlipidemia. Katlyn is not on a statin.   Lab Results  Component Value Date   CHOL 268 (H) 05/27/2019   HDL 48.50 05/27/2019   LDLCALC 193 (H) 05/27/2019   TRIG 134.0 05/27/2019   CHOLHDL 6 05/27/2019   Lab Results  Component Value Date   ALT 11 05/27/2019   AST 11 05/27/2019   ALKPHOS 109 05/27/2019   BILITOT 0.4 05/27/2019   The 10-year ASCVD risk score Mikey Bussing DC Jr., et al., 2013) is: 5.8%   Values used to calculate the score:     Age: 66 years     Sex: Female     Is Non-Hispanic African American: Yes     Diabetic: No     Tobacco  smoker: Yes     Systolic Blood Pressure: [id number] mmHg     Is BP treated: No     HDL Cholesterol: 48.5 mg/dL     Total Cholesterol: 268 mg/dL  Tobacco abuse. Kiaria smokes 1 pack every 2 days and is not interested in quitting.  At risk for heart disease. Caily is at a higher than average risk for cardiovascular disease due to obesity.   Depression screening. Manvi had a negative depression screen with a PHQ-9 score of 4.  Assessment/Plan:   Other fatigue. Asenat does feel that her weight is causing her energy  to be lower than it should be. Fatigue may be related to obesity, depression or many other causes. Labs will be ordered, and in the meanwhile, Mekhiya will focus on self care including making healthy food choices, increasing physical activity and focusing on stress reduction.  EKG 12-Lead, Hemoglobin A1c, Insulin, random, VITAMIN D 25 Hydroxy (Vit-D Deficiency, Fractures), Vitamin B12, Folate testing ordered today.  Shortness of breath on exertion. Lark does feel that she gets out of breath more easily that she used to when she exercises. Aniya's shortness of breath appears to be obesity related and exercise induced. She has agreed to work on weight loss and gradually increase exercise to treat her exercise induced shortness of breath. Will continue to monitor closely.  Other hyperlipidemia. Cardiovascular risk and specific lipid/LDL goals reviewed.  We discussed several lifestyle modifications today and Shelise will continue to work on diet, exercise and weight loss efforts. Orders and follow up as documented in patient record. Mahria will have a follow-up fasting lipid panel in 3 months.  Counseling Intensive lifestyle modifications are the first line treatment for this issue. . Dietary changes: Increase soluble fiber. Decrease simple carbohydrates. . Exercise changes: Moderate to vigorous-intensity aerobic activity 150 minutes per week if tolerated. . Lipid-lowering medications: see documented in medical record.  Tobacco abuse. Will follow-up in the next upcoming appointments.  At risk for heart disease. Savreen was given approximately 15 minutes of coronary artery disease prevention counseling today. She is 54 y.o. female and has risk factors for heart disease including obesity. We discussed intensive lifestyle modifications today with an emphasis on specific weight loss instructions and strategies.   Repetitive spaced learning was employed today to elicit superior memory formation and behavioral  change.  Depression screening. Mithra had a negative depression screen.  Class 2 severe obesity with serious comorbidity and body mass index (BMI) of 38.0 to 38.9 in adult, unspecified obesity type ([place]).  Dali is currently in the action stage of change and her goal is to continue with weight loss efforts. I recommend Tiffeny begin the structured treatment plan as follows:  She has agreed to the Category 3 Plan.  Exercise goals: No exercise has been prescribed at this time.   Behavioral modification strategies: increasing lean protein intake, increasing vegetables, meal planning and cooking strategies and keeping healthy foods in the home.  She was informed of the importance of frequent follow-up visits to maximize her success with intensive lifestyle modifications for her multiple health conditions. She was informed we would discuss her lab results at her next visit unless there is a critical issue that needs to be addressed sooner. Tionna agreed to keep her next visit at the agreed upon time to discuss these results.  Objective:   Blood pressure 124/69, pulse 96, temperature 98.4 F (36.9 C), temperature source Oral, height 5\' 6"  (1.676 m), weight 238 lb (108 kg), SpO2 96 %.  Body mass index is 38.41 kg/m.  EKG: Sinus  Rhythm with a rate of 89 BPM. Short PR syndrome. PRi = 116 - Nonspecific T-abnormality. Abnormal. Unchanged from 04/10/2018.  Indirect Calorimeter completed today shows a VO2 of 231 and a REE of 1610.  Her calculated basal metabolic rate is 0000000 thus her basal metabolic rate is worse than expected.  General: Cooperative, alert, well developed, in no acute distress. HEENT: Conjunctivae and lids unremarkable. Cardiovascular: Regular rhythm.  Lungs: Normal work of breathing. Neurologic: No focal deficits.   Lab Results  Component Value Date   CREATININE 0.67 05/27/2019   BUN 17 05/27/2019   NA 138 05/27/2019   K 4.7 05/27/2019   CL 102 05/27/2019   CO2 30 05/27/2019   Lab  Results  Component Value Date   ALT 11 05/27/2019   AST 11 05/27/2019   ALKPHOS 109 05/27/2019   BILITOT 0.4 05/27/2019   Lab Results  Component Value Date   HGBA1C 6.1 (H) 07/30/2019   Lab Results  Component Value Date   INSULIN 14.4 07/30/2019   Lab Results  Component Value Date   TSH 1.64 05/27/2019   Lab Results  Component Value Date   CHOL 268 (H) 05/27/2019   HDL 48.50 05/27/2019   LDLCALC 193 (H) 05/27/2019   TRIG 134.0 05/27/2019   CHOLHDL 6 05/27/2019   Lab Results  Component Value Date   WBC 10.7 (H) 05/27/2019   HGB 14.0 05/27/2019   HCT 42.4 05/27/2019   MCV 89.2 05/27/2019   PLT 445.0 (H) 05/27/2019   No results found for: IRON, TIBC, FERRITIN  Attestation Statements:   Reviewed by clinician on day of visit: allergies, medications, problem list, medical history, surgical history, family history, social history, and previous encounter notes.  I, Michaelene Song, am acting as transcriptionist for Coralie Common, MD   I have reviewed the above documentation for accuracy and completeness, and I agree with the above. - Ilene Qua, MD

## 2019-08-01 ENCOUNTER — Ambulatory Visit: Payer: BC Managed Care – PPO | Admitting: Medical

## 2019-08-01 ENCOUNTER — Telehealth: Payer: BC Managed Care – PPO | Admitting: Medical

## 2019-08-01 ENCOUNTER — Encounter (INDEPENDENT_AMBULATORY_CARE_PROVIDER_SITE_OTHER): Payer: Self-pay | Admitting: Family Medicine

## 2019-08-04 NOTE — Telephone Encounter (Signed)
Please advise 

## 2019-08-05 NOTE — Telephone Encounter (Signed)
Please advise 

## 2019-08-13 ENCOUNTER — Ambulatory Visit (INDEPENDENT_AMBULATORY_CARE_PROVIDER_SITE_OTHER): Payer: BLUE CROSS/BLUE SHIELD | Admitting: Family Medicine

## 2019-08-13 ENCOUNTER — Encounter (INDEPENDENT_AMBULATORY_CARE_PROVIDER_SITE_OTHER): Payer: Self-pay | Admitting: Family Medicine

## 2019-08-13 ENCOUNTER — Other Ambulatory Visit: Payer: Self-pay

## 2019-08-13 VITALS — BP 130/78 | HR 91 | Temp 98.1°F | Ht 66.0 in | Wt 234.0 lb

## 2019-08-13 DIAGNOSIS — D72828 Other elevated white blood cell count: Secondary | ICD-10-CM | POA: Diagnosis not present

## 2019-08-13 DIAGNOSIS — E7849 Other hyperlipidemia: Secondary | ICD-10-CM | POA: Diagnosis not present

## 2019-08-13 DIAGNOSIS — R7303 Prediabetes: Secondary | ICD-10-CM

## 2019-08-13 DIAGNOSIS — E559 Vitamin D deficiency, unspecified: Secondary | ICD-10-CM

## 2019-08-13 DIAGNOSIS — Z6837 Body mass index (BMI) 37.0-37.9, adult: Secondary | ICD-10-CM

## 2019-08-13 DIAGNOSIS — Z9189 Other specified personal risk factors, not elsewhere classified: Secondary | ICD-10-CM

## 2019-08-13 MED ORDER — ROSUVASTATIN CALCIUM 10 MG PO TABS: 10.0000 mg | ORAL_TABLET | Freq: Every day | ORAL | 0 refills | Status: DC

## 2019-08-13 MED ORDER — VITAMIN D (ERGOCALCIFEROL) 1.25 MG (50000 UNIT) PO CAPS: 50000.0000 [IU] | ORAL_CAPSULE | ORAL | 0 refills | Status: DC

## 2019-08-14 ENCOUNTER — Encounter (INDEPENDENT_AMBULATORY_CARE_PROVIDER_SITE_OTHER): Payer: Self-pay | Admitting: Family Medicine

## 2019-08-14 NOTE — Progress Notes (Signed)
Chief Complaint:   OBESITY Jasmin Stewart is here to discuss her progress with her obesity treatment plan along with follow-up of her obesity related diagnoses. Jasmin Stewart is on the Category 3 Plan and states she is following her eating plan approximately 98% of the time. Jasmin Stewart states she is exercising for 0 minutes 0 times per week.  Today's visit was #: 2 Starting weight: 238 lbs Starting date: 07/30/2019 Today's weight: 234 lbs Today's date: 08/13/2019 Total lbs lost to date: 4 lbs Total lbs lost since last in-office visit: 4 lbs  Interim History: Jasmin Stewart has been following the plan almost completely.  She has a scale with her most of the time.  She felt 3 eggs was a bit intimidating, so she did the yogurt breakfast most of the time.  Eight ounces of protein at dinner is quite a bit and she says she has been making up the 2 ounces with other options.  She is doing beef jerky for snacks, [employer], 1/2 Vitamin Water.  Denies cravings.  Subjective:   1. Other hyperlipidemia Jasmin Stewart has hyperlipidemia and has been trying to improve her cholesterol levels with intensive lifestyle modification including a low saturated fat diet, exercise and weight loss. She denies any chest pain, claudication or myalgias.  She is not on a statin.  10-year ASCVD risk factor 6.5%.  Maximum statin therapy is encouraged.  Lab Results  Component Value Date   ALT 11 05/27/2019   AST 11 05/27/2019   ALKPHOS 109 05/27/2019   BILITOT 0.4 05/27/2019   Lab Results  Component Value Date   CHOL 268 (H) 05/27/2019   HDL 48.50 05/27/2019   LDLCALC 193 (H) 05/27/2019   TRIG 134.0 05/27/2019   CHOLHDL 6 05/27/2019   2. Vitamin D deficiency Jasmin Stewart's Vitamin D level was 16.8 on 07/30/2019. She is currently taking no vitamin D supplement. She denies nausea, vomiting or muscle weakness.  She endorses fatigue.  3. Prediabetes Jasmin Stewart has a diagnosis of prediabetes based on her elevated HgA1c and was informed this puts her at greater risk  of developing diabetes. She continues to work on diet and exercise to decrease her risk of diabetes. She denies nausea or hypoglycemia.  This is a new diagnosis for Jasmin Stewart.  Lab Results  Component Value Date   HGBA1C 6.1 (H) 07/30/2019   Lab Results  Component Value Date   INSULIN 14.4 07/30/2019   4. Other elevated white blood cell (WBC) count WBC seems fairly consistently elevated.  No discrepancies on differential.  Lab Results  Component Value Date   WBC 10.7 (H) 05/27/2019   5. At risk for diabetes mellitus Jasmin Stewart is at higher than average risk for developing diabetes due to her obesity.   Assessment/Plan:   1. Other hyperlipidemia Cardiovascular risk and specific lipid/LDL goals reviewed.  We discussed several lifestyle modifications today and [name] will continue to work on diet, exercise and weight loss efforts. Orders and follow up as documented in patient record.  Jasmin Stewart will start rosuvastatin, as per below.   Counseling Intensive lifestyle modifications are the first line treatment for this issue. . Dietary changes: Increase soluble fiber. Decrease simple carbohydrates. . Exercise changes: Moderate to vigorous-intensity aerobic activity 150 minutes per week if tolerated. . Lipid-lowering medications: see documented in medical record. - rosuvastatin (CRESTOR) 10 MG tablet; Take 1 tablet (10 mg total) by mouth daily.  Dispense: 30 tablet; Refill: 0  2. Vitamin D deficiency Low Vitamin D level contributes to fatigue and  are associated with obesity, breast, and colon cancer. She agrees to start to take prescription Vitamin D @50 ,000 IU every week and will follow-up for routine testing of Vitamin D, at least 2-3 times per year to avoid over-replacement. - Vitamin D, Ergocalciferol, (DRISDOL) 1.25 MG (50000 UNIT) CAPS capsule; Take 1 capsule (50,000 Units total) by mouth every 7 (seven) days.  Dispense: 4 capsule; Refill: 0  3. Prediabetes Jasmin Stewart will continue to work on weight loss,  exercise, and decreasing simple carbohydrates to help decrease the risk of diabetes.  Will recheck A1c and insulin level in 3 months.  4. Other elevated white blood cell (WBC) count Will recheck CBC with diff in 3 months.  5. At risk for diabetes mellitus Jasmin Stewart was given approximately 15 minutes of diabetes education and counseling today. We discussed intensive lifestyle modifications today with an emphasis on weight loss as well as increasing exercise and decreasing simple carbohydrates in her diet. We also reviewed medication options with an emphasis on risk versus benefit of those discussed.   Repetitive spaced learning was employed today to elicit superior memory formation and behavioral change.  6. Class 2 severe obesity with serious comorbidity and body mass index (BMI) of 37.0 to 37.9 in adult, unspecified obesity type [facility]) Jasmin Stewart is currently in the action stage of change. As such, her goal is to continue with weight loss efforts. She has agreed to the Category 3 Plan.   Exercise goals: No exercise has been prescribed at this time.  Behavioral modification strategies: increasing lean protein intake, increasing vegetables, meal planning and cooking strategies, keeping healthy foods in the home and planning for success.  Jasmin Stewart has agreed to follow-up with our clinic in 2 weeks. She was informed of the importance of frequent follow-up visits to maximize her success with intensive lifestyle modifications for her multiple health conditions.   Objective:   Blood pressure 130/78, pulse 91, temperature 98.1 F (36.7 C), temperature source Oral, height 5\' 6"  (1.676 m), weight 234 lb (106.1 kg), SpO2 96 %. Body mass index is 37.77 kg/m.  General: Cooperative, alert, well developed, in no acute distress. HEENT: Conjunctivae and lids unremarkable. Cardiovascular: Regular rhythm.  Lungs: Normal work of breathing. Neurologic: No focal deficits.   Lab Results  Component Value Date   CREATININE  0.67 05/27/2019   BUN 17 05/27/2019   NA 138 05/27/2019   K 4.7 05/27/2019   CL 102 05/27/2019   CO2 30 05/27/2019   Lab Results  Component Value Date   ALT 11 05/27/2019   AST 11 05/27/2019   ALKPHOS 109 05/27/2019   BILITOT 0.4 05/27/2019   Lab Results  Component Value Date   HGBA1C 6.1 (H) 07/30/2019   Lab Results  Component Value Date   INSULIN 14.4 07/30/2019   Lab Results  Component Value Date   TSH 1.64 05/27/2019   Lab Results  Component Value Date   CHOL 268 (H) 05/27/2019   HDL 48.50 05/27/2019   LDLCALC 193 (H) 05/27/2019   TRIG 134.0 05/27/2019   CHOLHDL 6 05/27/2019   Lab Results  Component Value Date   WBC 10.7 (H) 05/27/2019   HGB 14.0 05/27/2019   HCT 42.4 05/27/2019   MCV 89.2 05/27/2019   PLT 445.0 (H) 05/27/2019   Attestation Statements:   Reviewed by clinician on day of visit: allergies, medications, problem list, medical history, surgical history, family history, social history, and previous encounter notes.  I, Water quality scientist, CMA, am acting as Location manager for [employer],  MD.  I have reviewed the above documentation for accuracy and completeness, and I agree with the above. - Ilene Qua, MD

## 2019-08-27 DIAGNOSIS — H5203 Hypermetropia, bilateral: Secondary | ICD-10-CM | POA: Diagnosis not present

## 2019-09-02 ENCOUNTER — Encounter (INDEPENDENT_AMBULATORY_CARE_PROVIDER_SITE_OTHER): Payer: Self-pay | Admitting: Family Medicine

## 2019-09-02 ENCOUNTER — Other Ambulatory Visit: Payer: Self-pay

## 2019-09-02 ENCOUNTER — Ambulatory Visit (INDEPENDENT_AMBULATORY_CARE_PROVIDER_SITE_OTHER): Payer: BC Managed Care – PPO | Admitting: Family Medicine

## 2019-09-02 VITALS — BP 136/74 | HR 78 | Temp 98.1°F | Ht 66.0 in | Wt 236.0 lb

## 2019-09-02 DIAGNOSIS — E7849 Other hyperlipidemia: Secondary | ICD-10-CM | POA: Diagnosis not present

## 2019-09-02 DIAGNOSIS — E559 Vitamin D deficiency, unspecified: Secondary | ICD-10-CM

## 2019-09-02 DIAGNOSIS — Z9189 Other specified personal risk factors, not elsewhere classified: Secondary | ICD-10-CM | POA: Diagnosis not present

## 2019-09-02 DIAGNOSIS — Z6838 Body mass index (BMI) 38.0-38.9, adult: Secondary | ICD-10-CM

## 2019-09-02 MED ORDER — VITAMIN D (ERGOCALCIFEROL) 1.25 MG (50000 UNIT) PO CAPS: 50000.0000 [IU] | ORAL_CAPSULE | ORAL | 0 refills | Status: DC

## 2019-09-03 NOTE — Progress Notes (Signed)
Chief Complaint:   OBESITY Jasmin Stewart is here to discuss her progress with her obesity treatment plan along with follow-up of her obesity related diagnoses. Jasmin Stewart is on the Category 3 Plan and states she is following her eating plan approximately 40% of the time. Cienna states she is dancing 3 times per week and walking 2 times per week.  Today's visit was #: 3 Starting weight: 238 lbs Starting date: 07/30/2019 Today's weight: 236 lbs Today's date: 09/02/2019 Total lbs lost to date: 2 lbs Total lbs lost since last in-office visit: 0  Interim History: Jasmin Stewart returned form the beach yesterday and says she needs to go to the grocery store to [employer] on food from the plan.  While she was at the beach, she reports doing some indulgent eating.  She is planning to do [employer] driving over the [place] weekend.  Subjective:   1. Vitamin D deficiency Jasmin Stewart's Vitamin D level was 16.8 on 07/30/2019. She is currently taking prescription vitamin D 50,000 IU each week. She denies nausea, vomiting or muscle weakness.  She endorses fatigue.  2. Other hyperlipidemia Jasmin Stewart has hyperlipidemia and has been trying to improve her cholesterol levels with intensive lifestyle modification including a low saturated fat diet, exercise and weight loss. She denies any chest pain, claudication or myalgias.  She is on Crestor.  She had some palpitations upon the initiation of Crestor.  Lab Results  Component Value Date   ALT 11 05/27/2019   AST 11 05/27/2019   ALKPHOS 109 05/27/2019   BILITOT 0.4 05/27/2019   Lab Results  Component Value Date   CHOL 268 (H) 05/27/2019   HDL 48.50 05/27/2019   LDLCALC 193 (H) 05/27/2019   TRIG 134.0 05/27/2019   CHOLHDL 6 05/27/2019   3. At risk for osteoporosis Jasmin Stewart is at higher risk of osteopenia and osteoporosis due to Vitamin D deficiency.   Assessment/Plan:   1. Vitamin D deficiency Low Vitamin D level contributes to fatigue and are associated with obesity, breast, and colon  cancer. She agrees to continue to take prescription Vitamin D @50 ,000 IU every week and will follow-up for routine testing of Vitamin D, at least 2-3 times per year to avoid over-replacement. - Vitamin D, Ergocalciferol, (DRISDOL) 1.25 MG (50000 UNIT) CAPS capsule; Take 1 capsule (50,000 Units total) by mouth every 7 (seven) days.  Dispense: 4 capsule; Refill: 0  2. Other hyperlipidemia Cardiovascular risk and specific lipid/LDL goals reviewed.  We discussed several lifestyle modifications today and Kennice will continue to work on diet, exercise and weight loss efforts. Orders and follow up as documented in patient record.   Counseling Intensive lifestyle modifications are the first line treatment for this issue. . Dietary changes: Increase soluble fiber. Decrease simple carbohydrates. . Exercise changes: Moderate to vigorous-intensity aerobic activity 150 minutes per week if tolerated. . Lipid-lowering medications: see documented in medical record.  3. At risk for osteoporosis Jasmin Stewart was given approximately 15 minutes of osteoporosis prevention counseling today. Jasmin Stewart is at risk for osteopenia and osteoporosis due to her Vitamin D deficiency. She was encouraged to take her Vitamin D and follow her higher calcium diet and increase strengthening exercise to help strengthen her bones and decrease her risk of osteopenia and osteoporosis.  Repetitive spaced learning was employed today to elicit superior memory formation and behavioral change.  4. Class 2 severe obesity with serious comorbidity and body mass index (BMI) of 38.0 to 38.9 in adult, unspecified obesity type ([facility]) Jasmin Stewart is currently  in the action stage of change. As such, her goal is to continue with weight loss efforts. She has agreed to the Category 3 Plan.   Exercise goals: No exercise has been prescribed at this time.  Behavioral modification strategies: increasing lean protein intake, meal planning and cooking strategies, keeping healthy  foods in the home and planning for success.  Jasmin Stewart has agreed to follow-up with our clinic in 2 weeks. She was informed of the importance of frequent follow-up visits to maximize her success with intensive lifestyle modifications for her multiple health conditions.   Objective:   Blood pressure 136/74, pulse 78, temperature 98.1 F (36.7 C), temperature source Oral, height 5\' 6"  (1.676 m), weight 236 lb (107 kg), SpO2 98 %. Body mass index is 38.09 kg/m.  General: Cooperative, alert, well developed, in no acute distress. HEENT: Conjunctivae and lids unremarkable. Cardiovascular: Regular rhythm.  Lungs: Normal work of breathing. Neurologic: No focal deficits.   Lab Results  Component Value Date   CREATININE 0.67 05/27/2019   BUN 17 05/27/2019   NA 138 05/27/2019   K 4.7 05/27/2019   CL 102 05/27/2019   CO2 30 05/27/2019   Lab Results  Component Value Date   ALT 11 05/27/2019   AST 11 05/27/2019   ALKPHOS 109 05/27/2019   BILITOT 0.4 05/27/2019   Lab Results  Component Value Date   HGBA1C 6.1 (H) 07/30/2019   Lab Results  Component Value Date   INSULIN 14.4 07/30/2019   Lab Results  Component Value Date   TSH 1.64 05/27/2019   Lab Results  Component Value Date   CHOL 268 (H) 05/27/2019   HDL 48.50 05/27/2019   LDLCALC 193 (H) 05/27/2019   TRIG 134.0 05/27/2019   CHOLHDL 6 05/27/2019   Lab Results  Component Value Date   WBC 10.7 (H) 05/27/2019   HGB 14.0 05/27/2019   HCT 42.4 05/27/2019   MCV 89.2 05/27/2019   PLT 445.0 (H) 05/27/2019   Attestation Statements:   Reviewed by clinician on day of visit: allergies, medications, problem list, medical history, surgical history, family history, social history, and previous encounter notes.  I, Water quality scientist, CMA, am acting as transcriptionist for Coralie Common, MD.  I have reviewed the above documentation for accuracy and completeness, and I agree with the above. - Jinny Blossom, MD

## 2019-09-07 ENCOUNTER — Other Ambulatory Visit (INDEPENDENT_AMBULATORY_CARE_PROVIDER_SITE_OTHER): Payer: Self-pay | Admitting: Family Medicine

## 2019-09-07 DIAGNOSIS — E559 Vitamin D deficiency, unspecified: Secondary | ICD-10-CM

## 2019-09-09 ENCOUNTER — Other Ambulatory Visit (INDEPENDENT_AMBULATORY_CARE_PROVIDER_SITE_OTHER): Payer: Self-pay | Admitting: Family Medicine

## 2019-09-09 DIAGNOSIS — E7849 Other hyperlipidemia: Secondary | ICD-10-CM

## 2019-09-17 ENCOUNTER — Ambulatory Visit (INDEPENDENT_AMBULATORY_CARE_PROVIDER_SITE_OTHER): Payer: BC Managed Care – PPO | Admitting: Family Medicine

## 2019-09-17 ENCOUNTER — Encounter (INDEPENDENT_AMBULATORY_CARE_PROVIDER_SITE_OTHER): Payer: Self-pay | Admitting: Family Medicine

## 2019-09-17 ENCOUNTER — Other Ambulatory Visit: Payer: Self-pay

## 2019-09-17 VITALS — BP 95/64 | HR 92 | Temp 99.0°F | Ht 66.0 in | Wt 226.0 lb

## 2019-09-17 DIAGNOSIS — Z6836 Body mass index (BMI) 36.0-36.9, adult: Secondary | ICD-10-CM | POA: Diagnosis not present

## 2019-09-17 DIAGNOSIS — R7303 Prediabetes: Secondary | ICD-10-CM

## 2019-09-17 DIAGNOSIS — E7849 Other hyperlipidemia: Secondary | ICD-10-CM

## 2019-09-18 NOTE — Progress Notes (Signed)
Chief Complaint:   OBESITY Jasmin Stewart is here to discuss her progress with her obesity treatment plan along with follow-up of her obesity related diagnoses. Jasmin Stewart is on the Category 3 Plan and states she is following her eating plan approximately 70% of the time. Jasmin Stewart states she is kayaking for 20 minutes 1 times per week.  Today's visit was #: 4 Starting weight: 238 lbs Starting date: 07/30/2019 Today's weight: 226 lbs Today's date: 09/17/2019 Total lbs lost to date: 12 lbs Total lbs lost since last in-office visit: 10  Interim History: Jasmin Stewart says that her birthday weekend she indulged at [employer], [employer], [employer], and [employer].  Yesterday, she got back on her mal plan for breakfast and lunch.  She had significant GI distress with numerous episodes of emesis and 1 episode of diarrhea.  Subjective:   1. Prediabetes Jasmin Stewart has a diagnosis of prediabetes based on her elevated HgA1c and was informed this puts her at greater risk of developing diabetes. She continues to work on diet and exercise to decrease her risk of diabetes. She denies nausea or hypoglycemia.  She is not on metformin.  Recent carb indulgence for her birthday.  Lab Results  Component Value Date   HGBA1C 6.1 (H) 07/30/2019   Lab Results  Component Value Date   INSULIN 14.4 07/30/2019   2. Other hyperlipidemia Jasmin Stewart has hyperlipidemia and has been trying to improve her cholesterol levels with intensive lifestyle modification including a low saturated fat diet, exercise and weight loss. She denies any chest pain, claudication or myalgias.  She is on Crestor.  Lab Results  Component Value Date   ALT 11 05/27/2019   AST 11 05/27/2019   ALKPHOS 109 05/27/2019   BILITOT 0.4 05/27/2019   Lab Results  Component Value Date   CHOL 268 (H) 05/27/2019   HDL 48.50 05/27/2019   LDLCALC 193 (H) 05/27/2019   TRIG 134.0 05/27/2019   CHOLHDL 6 05/27/2019   Assessment/Plan:   1.  Prediabetes Jasmin Stewart will continue to work on weight loss, exercise, and decreasing simple carbohydrates to help decrease the risk of diabetes.  Follow-up in 2 months for repeat labs.  2. Other hyperlipidemia Cardiovascular risk and specific lipid/LDL goals reviewed.  We discussed several lifestyle modifications today and Jasmin Stewart will continue to work on diet, exercise and weight loss efforts. Orders and follow up as documented in patient record.  Will repeat labs in 2 months.  Counseling Intensive lifestyle modifications are the first line treatment for this issue. . Dietary changes: Increase soluble fiber. Decrease simple carbohydrates. . Exercise changes: Moderate to vigorous-intensity aerobic activity 150 minutes per week if tolerated. . Lipid-lowering medications: see documented in medical record.  3. Class 2 severe obesity with serious comorbidity and body mass index (BMI) of 36.0 to 36.9 in adult, unspecified obesity type [facility]) Jasmin Stewart is currently in the action stage of change. As such, her goal is to continue with weight loss efforts. She has agreed to the Category 3 Plan.   Exercise goals: For substantial health benefits, adults should do at least 150 minutes (2 hours and 30 minutes) a week of moderate-intensity, or 75 minutes (1 hour and 15 minutes) a week of vigorous-intensity aerobic physical activity, or an equivalent combination of moderate- and vigorous-intensity aerobic activity. Aerobic activity should be performed in episodes of at least 10 minutes, and preferably, it should be spread throughout the week.  Behavioral modification strategies: increasing lean protein  intake, meal planning and cooking strategies, keeping healthy foods in the home and planning for success.  Jasmin Stewart has agreed to follow-up with our clinic in 2.5 weeks. She was informed of the importance of frequent follow-up visits to maximize her success with intensive lifestyle modifications for her multiple health conditions.    Objective:   Blood pressure 95/64, pulse 92, temperature 99 F (37.2 C), temperature source Oral, height 5\' 6"  (1.676 m), weight 226 lb (102.5 kg), SpO2 97 %. Body mass index is 36.48 kg/m.  General: Cooperative, alert, well developed, in no acute distress. HEENT: Conjunctivae and lids unremarkable. Cardiovascular: Regular rhythm.  Lungs: Normal work of breathing. Neurologic: No focal deficits.   Lab Results  Component Value Date   CREATININE 0.67 05/27/2019   BUN 17 05/27/2019   NA 138 05/27/2019   K 4.7 05/27/2019   CL 102 05/27/2019   CO2 30 05/27/2019   Lab Results  Component Value Date   ALT 11 05/27/2019   AST 11 05/27/2019   ALKPHOS 109 05/27/2019   BILITOT 0.4 05/27/2019   Lab Results  Component Value Date   HGBA1C 6.1 (H) 07/30/2019   Lab Results  Component Value Date   INSULIN 14.4 07/30/2019   Lab Results  Component Value Date   TSH 1.64 05/27/2019   Lab Results  Component Value Date   CHOL 268 (H) 05/27/2019   HDL 48.50 05/27/2019   LDLCALC 193 (H) 05/27/2019   TRIG 134.0 05/27/2019   CHOLHDL 6 05/27/2019   Lab Results  Component Value Date   WBC 10.7 (H) 05/27/2019   HGB 14.0 05/27/2019   HCT 42.4 05/27/2019   MCV 89.2 05/27/2019   PLT 445.0 (H) 05/27/2019   Attestation Statements:   Reviewed by clinician on day of visit: allergies, medications, problem list, medical history, surgical history, family history, social history, and previous encounter notes.  Time spent on visit including pre-visit chart review and post-visit care and charting was 15 minutes.   I, Water quality scientist, CMA, am acting as transcriptionist for Coralie Common, MD.  I have reviewed the above documentation for accuracy and completeness, and I agree with the above. - Jinny Blossom, MD

## 2019-09-19 ENCOUNTER — Emergency Department (HOSPITAL_BASED_OUTPATIENT_CLINIC_OR_DEPARTMENT_OTHER)
Admission: EM | Admit: 2019-09-19 | Discharge: 2019-09-19 | Disposition: A | Discharge status: Home / Self Care | Payer: BC Managed Care – PPO | Attending: Emergency Medicine | Admitting: Emergency Medicine

## 2019-09-19 ENCOUNTER — Emergency Department (HOSPITAL_BASED_OUTPATIENT_CLINIC_OR_DEPARTMENT_OTHER): Payer: BC Managed Care – PPO

## 2019-09-19 ENCOUNTER — Encounter (HOSPITAL_BASED_OUTPATIENT_CLINIC_OR_DEPARTMENT_OTHER): Payer: Self-pay | Admitting: Emergency Medicine

## 2019-09-19 ENCOUNTER — Other Ambulatory Visit: Payer: Self-pay

## 2019-09-19 DIAGNOSIS — Z79899 Other long term (current) drug therapy: Secondary | ICD-10-CM | POA: Diagnosis not present

## 2019-09-19 DIAGNOSIS — J45909 Unspecified asthma, uncomplicated: Secondary | ICD-10-CM | POA: Insufficient documentation

## 2019-09-19 DIAGNOSIS — K529 Noninfective gastroenteritis and colitis, unspecified: Secondary | ICD-10-CM | POA: Diagnosis not present

## 2019-09-19 DIAGNOSIS — F17210 Nicotine dependence, cigarettes, uncomplicated: Secondary | ICD-10-CM | POA: Diagnosis not present

## 2019-09-19 DIAGNOSIS — Z7902 Long term (current) use of antithrombotics/antiplatelets: Secondary | ICD-10-CM | POA: Insufficient documentation

## 2019-09-19 DIAGNOSIS — R112 Nausea with vomiting, unspecified: Secondary | ICD-10-CM | POA: Diagnosis not present

## 2019-09-19 DIAGNOSIS — R1110 Vomiting, unspecified: Secondary | ICD-10-CM | POA: Diagnosis not present

## 2019-09-19 LAB — CBC
HCT: 44.8 % (ref 36.0–46.0)
Hemoglobin: 14.5 g/dL (ref 12.0–15.0)
MCH: 29.4 pg (ref 26.0–34.0)
MCHC: 32.4 g/dL (ref 30.0–36.0)
MCV: 90.7 fL (ref 80.0–100.0)
Platelets: 428 10*3/uL — ABNORMAL HIGH (ref 150–400)
RBC: 4.94 MIL/uL (ref 3.87–5.11)
RDW: 14.6 % (ref 11.5–15.5)
WBC: 13.1 10*3/uL — ABNORMAL HIGH (ref 4.0–10.5)
nRBC: 0 % (ref 0.0–0.2)

## 2019-09-19 LAB — COMPREHENSIVE METABOLIC PANEL
ALT: 17 U/L (ref 0–44)
AST: 15 U/L (ref 15–41)
Albumin: 3.8 g/dL (ref 3.5–5.0)
Alkaline Phosphatase: 81 U/L (ref 38–126)
Anion gap: 10 (ref 5–15)
BUN: 12 mg/dL (ref 6–20)
CO2: 25 mmol/L (ref 22–32)
Calcium: 8.9 mg/dL (ref 8.9–10.3)
Chloride: 104 mmol/L (ref 98–111)
Creatinine, Ser: 0.7 mg/dL (ref 0.44–1.00)
GFR calc Af Amer: >60 mL/min (ref >60)
GFR calc non Af Amer: >60 mL/min (ref >60)
Glucose, Bld: 100 mg/dL — ABNORMAL HIGH (ref 70–99)
Potassium: 3.9 mmol/L (ref 3.5–5.1)
Sodium: 139 mmol/L (ref 135–145)
Total Bilirubin: 0.7 mg/dL (ref 0.3–1.2)
Total Protein: 7 g/dL (ref 6.5–8.1)

## 2019-09-19 LAB — URINALYSIS, ROUTINE W REFLEX MICROSCOPIC
Glucose, UA: NEGATIVE mg/dL
Hgb urine dipstick: NEGATIVE
Ketones, ur: NEGATIVE mg/dL
Leukocytes,Ua: NEGATIVE
Nitrite: NEGATIVE
Protein, ur: NEGATIVE mg/dL
Specific Gravity, Urine: >1.03 — ABNORMAL HIGH (ref 1.005–1.030)
pH: 5 (ref 5.0–8.0)

## 2019-09-19 LAB — PREGNANCY, URINE: Preg Test, Ur: NEGATIVE

## 2019-09-19 LAB — LIPASE, BLOOD: Lipase: 19 U/L (ref 11–51)

## 2019-09-19 MED ORDER — MORPHINE SULFATE (PF) 4 MG/ML IV SOLN
4.0000 mg | Freq: Once | INTRAVENOUS | Status: AC
  Administered 2019-09-19: 4 mg via INTRAVENOUS
  Filled 2019-09-19: qty 1

## 2019-09-19 MED ORDER — DICYCLOMINE HCL 20 MG PO TABS
20.0000 mg | ORAL_TABLET | Freq: Two times a day (BID) | ORAL | 0 refills | Status: DC
Start: 2019-09-19 — End: 2019-10-16

## 2019-09-19 MED ORDER — ONDANSETRON HCL 4 MG/2ML IJ SOLN
INTRAMUSCULAR | Status: AC
  Administered 2019-09-19: 4 mg
  Filled 2019-09-19: qty 2

## 2019-09-19 MED ORDER — SODIUM CHLORIDE 0.9% FLUSH
3.0000 mL | Freq: Once | INTRAVENOUS | Status: DC
  Filled 2019-09-19: qty 3

## 2019-09-19 MED ORDER — SODIUM CHLORIDE 0.9 % IV BOLUS
1000.0000 mL | Freq: Once | INTRAVENOUS | Status: AC
  Administered 2019-09-19: 1000 mL via INTRAVENOUS

## 2019-09-19 MED ORDER — IOHEXOL 300 MG/ML  SOLN
100.0000 mL | Freq: Once | INTRAMUSCULAR | Status: AC | PRN
  Administered 2019-09-19: 100 mL via INTRAVENOUS

## 2019-09-19 MED ORDER — DICYCLOMINE HCL 10 MG/ML IM SOLN
20.0000 mg | Freq: Once | INTRAMUSCULAR | Status: AC
  Administered 2019-09-19: 20 mg via INTRAMUSCULAR
  Filled 2019-09-19: qty 2

## 2019-09-19 NOTE — ED Provider Notes (Signed)
[facility] EM[facility]GENC[facility] DEPARTMENT Provider Note   CSN: [id number] Arrival date & time: 09/19/19  9381     History Chief Complaint  Patient presents with  . Abdominal Pain  . N/V/D    Jasmin Stewart is a 54 y.o. female with a past medical history of prior C-section, prior hernia repair presenting to the ED with a chief complaint of abdominal cramping, vomiting, diarrhea for the past 3 days.  Reports numerous sick contacts at work with similar symptoms.  3 days ago came home from work and started having several episodes of nonbloody, nonbilious emesis.  This persisted but then developed watery diarrhea as well.  She denies any suspicious food intake prior to symptom onset.  She has not taken any medications to help with her symptoms.  Has only been able to eat a few things for breakfast since her symptoms began were.  Reports intermittent cramping abdominal pain without specific aggravating or alleviating factor.  Denies any urinary symptoms, pelvic complaints, vaginal complaints, chest pain, shortness of breath, cough. She has a history of gastric ulcer diagnosed in 2006, currently on Nexium.  HPI     Past Medical History:  Diagnosis Date  . Abdominal hernia   . Anemia   . Asthma   . Fibroid, uterine    never had surgery  . History of blood transfusion 1991; 1995   related to childbirth  . History of chicken pox    Age 34  . Hypercholesteremia   . Hypoglycemia   . Hypokalemia   . Hypotension   . Pneumonia 2013,2014, 2018  . PVC (premature ventricular contraction)   . Ulcer of esophagus     Patient Active Problem List   Diagnosis Date Noted  . Calcific tendinitis of left shoulder 12/02/2018  . Hyperlipidemia 04/19/2018  . Ventral incisional hernia 06/05/2017  . PVC's (premature ventricular contractions) 06/09/2016    Past Surgical History:  Procedure Laterality Date  . ABDOMINAL HERNIA REPAIR  2007; 06/05/2017   Suprapubic, Surgery 2007 with mesh VHR w/mesh;  OPEN VENTRAL HERNIA REPAIR WITH MESH  . [place]; 1995; 1998  . ESOPHAGOGASTRODUODENOSCOPY  01/2017  . HERNIA REPAIR  2007; 06/05/2017  . INSERTION OF MESH N/A 06/05/2017   Procedure: INSERTION OF MESH;  Surgeon: Donnie Mesa, MD;  Location: [facility];  Service: General;  Laterality: N/A;  . VENTRAL HERNIA REPAIR N/A 06/05/2017   Procedure: OPEN VENTRAL HERNIA REPAIR WITH MESH;  Surgeon: Donnie Mesa, MD;  Location: [facility];  Service: General;  Laterality: N/A;     OB History    Gravida  7   Para  4   Term      Preterm      AB  3   Living  4     SAB      TAB      Ectopic      Multiple      Live Births              Family History  Problem Relation Age of Onset  . Thyroid disease Mother   . Atrial fibrillation Mother   . Hyperlipidemia Mother   . Hyperlipidemia Sister   . Breast cancer Sister   . Hyperlipidemia Brother   . Thyroid disease Maternal Grandmother   . Glaucoma Maternal Grandmother   . Cataracts Maternal Grandmother   . Esophageal cancer Maternal Grandfather   . Hypertension Maternal Uncle   . Obesity Maternal Uncle   . Allergies  Son        Hay Fever  . Healthy Daughter   . Colon cancer Neg Hx   . Colon polyps Neg Hx   . Stomach cancer Neg Hx   . Rectal cancer Neg Hx     Social History   Tobacco Use  . Smoking status: Current Every Day Smoker    Packs/day: 0.25    Years: 36.00    Pack years: 9.00    Types: Cigarettes  . Smokeless tobacco: Never Used  Vaping Use  . Vaping Use: Former  Substance Use Topics  . Alcohol use: Not Currently    Alcohol/week: 0.0 standard drinks  . Drug use: No    Home Medications Prior to Admission medications   Medication Sig Start Date End Date Taking? Authorizing Provider  dicyclomine (BENTYL) 20 MG tablet Take 1 tablet (20 mg total) by mouth 2 (two) times daily. 09/19/19   Annamarie Yamaguchi, PA-C  esomeprazole (NEXIUM) 20 MG capsule Take 20 mg by mouth daily as needed.     [provider]  rosuvastatin (CRESTOR) 10 MG tablet Take 1 tablet (10 mg total) by mouth daily. 08/13/19   Eber Jones, MD  Vitamin D, Ergocalciferol, (DRISDOL) 1.25 MG (50000 UNIT) CAPS capsule Take 1 capsule (50,000 Units total) by mouth every 7 (seven) days. 09/02/19   Eber Jones, MD    Allergies    Patient has no known allergies.  Review of Systems   Review of Systems  Constitutional: Negative for appetite change, chills and fever.  HENT: Negative for ear pain, rhinorrhea, sneezing and sore throat.   Eyes: Negative for photophobia and visual disturbance.  Respiratory: Negative for cough, chest tightness, shortness of breath and wheezing.   Cardiovascular: Negative for chest pain and palpitations.  Gastrointestinal: Positive for abdominal pain, diarrhea, nausea and vomiting. Negative for blood in stool and constipation.  Genitourinary: Negative for dysuria, hematuria and urgency.  Musculoskeletal: Negative for myalgias.  Skin: Negative for rash.  Neurological: Negative for dizziness, weakness and light-headedness.    Physical Exam Updated Vital Signs BP 98/67 Comment: rm air  Pulse (!) 58 Comment: rm air  Temp 98.6 F (37 C) (Oral)   Resp 15   SpO2 93% Comment: rm air  Physical Exam Vitals and nursing note reviewed.  Constitutional:      General: She is not in acute distress.    Appearance: She is well-developed.  HENT:     Head: Normocephalic and atraumatic.     Nose: Nose normal.  Eyes:     General: No scleral icterus.       Left eye: No discharge.     Conjunctiva/sclera: Conjunctivae normal.  Cardiovascular:     Rate and Rhythm: Normal rate and regular rhythm.     Heart sounds: Normal heart sounds. No murmur heard.  No friction rub. No gallop.   Pulmonary:     Effort: Pulmonary effort is normal. No respiratory distress.     Breath sounds: Normal breath sounds.  Abdominal:     General: Bowel sounds are normal. There is no distension.      Palpations: Abdomen is soft.     Tenderness: There is abdominal tenderness in the epigastric area, periumbilical area and suprapubic area. There is no guarding.  Musculoskeletal:        General: Normal range of motion.     Cervical back: Normal range of motion and neck supple.  Skin:    General: Skin is warm and dry.  Findings: No rash.  Neurological:     Mental Status: She is alert.     Motor: No abnormal muscle tone.     Coordination: Coordination normal.     ED Results / Procedures / Treatments   Labs (all labs ordered are listed, but only abnormal results are displayed) Labs Reviewed  CBC - Abnormal; Notable for the following components:      Result Value   WBC 13.1 (*)    Platelets 428 (*)    All other components within normal limits  URINALYSIS, ROUTINE W REFLEX MICROSCOPIC - Abnormal; Notable for the following components:   APPearance CLOUDY (*)    Specific Gravity, Urine >1.030 (*)    Bilirubin Urine SMALL (*)    All other components within normal limits  COMPREHENSIVE METABOLIC PANEL - Abnormal; Notable for the following components:   Glucose, Bld 100 (*)    All other components within normal limits  PREGNANCY, URINE  LIPASE, BLOOD    EKG None  Radiology CT ABDOMEN PELVIS W CONTRAST  Result Date: 09/19/2019 CLINICAL DATA:  Nausea, vomiting and diarrhea for 4 days. EXAM: CT ABDOMEN AND PELVIS WITH CONTRAST TECHNIQUE: Multidetector CT imaging of the abdomen and pelvis was performed using the standard protocol following bolus administration of intravenous contrast. CONTRAST:  195mL OMNIPAQUE IOHEXOL 300 MG/ML  SOLN COMPARISON:  None. FINDINGS: Lower chest: The lung bases are clear of acute process. No pleural effusion or pulmonary lesions. The heart is normal in size. No pericardial effusion. The distal esophagus and aorta are unremarkable. Hepatobiliary: No attic lesions. No intrahepatic biliary dilatation. The gallbladder appears normal. No common bile duct  dilatation. Pancreas: No mass, inflammation or ductal dilatation. Spleen: Normal size.  No focal lesions. Adrenals/Urinary Tract: Low-attenuation right adrenal gland lesion measures 16.5 Hounsfield units on the delayed images. It measures a maximum of 2 cm and is most likely a benign adenoma. The left adrenal gland is normal. There is a duplicated collecting system associated with the right kidney. No worrisome renal lesions or hydronephrosis. No obstructing ureteral calculi or bladder calculi. Stomach/Bowel: The stomach and duodenum are unremarkable. The proximal and mid small bowel appear normal. There is a fairly extensive inflammatory or infectious process involving the mid distal ileum all the way to the terminal ileum. There is wall thickening, submucosal edema, serosal and mucosal enhancement and perienteric inflammatory changes along with a small amount of fluid. The colon is unremarkable. No obstructive findings or acute inflammatory process. The appendix is normal. Vascular/Lymphatic: Age advanced atherosclerotic calcifications involving the aorta and iliac arteries. No mesenteric or retroperitoneal mass or adenopathy. Reproductive: The uterus and ovaries are unremarkable. Other: No pelvic mass or pelvic lymphadenopathy. No inguinal adenopathy. Small amount of free pelvic fluid noted and likely related to the small bowel process. Musculoskeletal: No significant findings. IMPRESSION: 1. Fairly extensive inflammatory or infectious process involving the mid distal ileum all the way to the terminal ileum. 2. No other significant abdominal/pelvic findings, mass lesions or adenopathy. 3. Age advanced atherosclerotic calcifications involving the aorta and iliac arteries. 4. 2 cm right adrenal gland lesion, most likely a benign adenoma. Recommend follow-up noncontrast abdominal CT scan in 6 months to document stability. 5. Duplicated right renal collecting system. 6. Aortic atherosclerosis. Aortic Atherosclerosis  (ICD10-I70.0). Electronically Signed   By: Marijo Sanes M.D.   On: 09/19/2019 15:34    Procedures Procedures (including critical care time)  Medications Ordered in ED Medications  sodium chloride flush (NS) 0.9 % injection 3 mL (3  mLs Intravenous Not Given 09/19/19 1337)  dicyclomine (BENTYL) injection 20 mg (20 mg Intramuscular Given 09/19/19 1347)  sodium chloride 0.9 % bolus 1,000 mL (0 mLs Intravenous Stopped 09/19/19 1504)  morphine 4 MG/ML injection 4 mg (4 mg Intravenous Given 09/19/19 1347)  ondansetron (ZOFRAN) 4 MG/2ML injection (4 mg  Given 09/19/19 1354)  iohexol (OMNIPAQUE) 300 MG/ML solution 100 mL (100 mLs Intravenous Contrast Given 09/19/19 1438)  morphine 4 MG/ML injection 4 mg (4 mg Intravenous Given 09/19/19 1559)    ED Course  I have reviewed the triage vital signs and the nursing notes.  Pertinent labs & imaging results that were available during my care of the patient were reviewed by me and considered in my medical decision making (see chart for details).    MDM Rules/Calculators/A&P                          54 year old female with past medical history of prior C-section and prior abdominal hernia repair presenting to the ED with a chief complaint of abdominal cramping, vomiting, diarrhea for the past 3 days.  Multiple sick contacts at work with similar symptoms.  Exam abdomen is tender in the mid abdomen without rebound or guarding.  She denies any urinary symptoms or pelvic complaints.  Work-up here significant for leukocytosis of 13, CMP, lipase and urinalysis are unremarkable.  CT of abdomen pelvis shows findings consistent with ileitis.  She also has incidental findings of a adrenal gland lesion as well as atherosclerotic changes of the aorta and iliac arteries.  She was informed of these findings.  She will follow up with her PCP regarding this.  Regarding her ileitis, will treat symptomatically and have her follow-up with her prior GI specialist.  Patient symptoms  controlled here and will be discharged home with return precautions.  All imaging, if done today, including plain films, CT scans, and ultrasounds, independently reviewed by me, and interpretations confirmed via formal radiology reads.  Patient is hemodynamically stable, in NAD, and able to ambulate in the ED. Evaluation does not show pathology that would require ongoing emergent intervention or inpatient treatment. I explained the diagnosis to the patient. Pain has been managed and has no complaints prior to discharge. Patient is comfortable with above plan and is stable for discharge at this time. All questions were answered prior to disposition. Strict return precautions for returning to the ED were discussed. Encouraged follow up with PCP.   An After Visit Summary was printed and given to the patient.   Portions of this note were generated with [employer]. Dictation errors may occur despite best attempts at proofreading.  Final Clinical Impression(s) / ED Diagnoses Final diagnoses:  Ileitis    Rx / DC Orders ED Discharge Orders         Ordered    dicyclomine (BENTYL) 20 MG tablet  2 times daily     Discontinue  Reprint     09/19/19 1622           Delia Heady, PA-C 09/19/19 1626    Sherwood Gambler, MD 09/19/19 1747

## 2019-09-19 NOTE — ED Triage Notes (Signed)
PT here with abdominal pain N/V/D x 3days.

## 2019-09-19 NOTE — Discharge Instructions (Signed)
Take the medications to help with your symptoms. Your CT scan showed some incidental findings including a 2 cm lesion on your right adrenal gland that you will need to tell your primary care provider about for a repeat CT scan in about 6 months to evaluate for stability. You also have some atherosclerotic calcifications in your aorta and iliac arteries.  This is also the you will need to inform your primary care provider about. Is important for you to follow-up with your GI specialist and your primary care provider. Return to the ER for worsening pain, bloody stools, chest pain or shortness of breath.

## 2019-09-19 NOTE — ED Notes (Signed)
Provider at bedside to discuss results.

## 2019-10-01 ENCOUNTER — Other Ambulatory Visit: Payer: Self-pay

## 2019-10-01 ENCOUNTER — Encounter (INDEPENDENT_AMBULATORY_CARE_PROVIDER_SITE_OTHER): Payer: Self-pay | Admitting: Family Medicine

## 2019-10-01 ENCOUNTER — Ambulatory Visit (INDEPENDENT_AMBULATORY_CARE_PROVIDER_SITE_OTHER): Payer: BC Managed Care – PPO | Admitting: Medical

## 2019-10-01 ENCOUNTER — Ambulatory Visit (INDEPENDENT_AMBULATORY_CARE_PROVIDER_SITE_OTHER): Payer: BC Managed Care – PPO | Admitting: Family Medicine

## 2019-10-01 VITALS — BP 107/70 | HR 74 | Temp 98.1°F | Ht 66.0 in | Wt 227.0 lb

## 2019-10-01 VITALS — BP 126/82 | HR 75 | Resp 18 | Ht 66.0 in | Wt 230.0 lb

## 2019-10-01 DIAGNOSIS — K529 Noninfective gastroenteritis and colitis, unspecified: Secondary | ICD-10-CM | POA: Diagnosis not present

## 2019-10-01 DIAGNOSIS — D72829 Elevated white blood cell count, unspecified: Secondary | ICD-10-CM | POA: Diagnosis not present

## 2019-10-01 DIAGNOSIS — R7303 Prediabetes: Secondary | ICD-10-CM

## 2019-10-01 DIAGNOSIS — E279 Disorder of adrenal gland, unspecified: Secondary | ICD-10-CM | POA: Diagnosis not present

## 2019-10-01 DIAGNOSIS — Z9189 Other specified personal risk factors, not elsewhere classified: Secondary | ICD-10-CM

## 2019-10-01 DIAGNOSIS — Z6836 Body mass index (BMI) 36.0-36.9, adult: Secondary | ICD-10-CM

## 2019-10-01 DIAGNOSIS — E559 Vitamin D deficiency, unspecified: Secondary | ICD-10-CM

## 2019-10-01 LAB — CBC WITH DIFFERENTIAL/PLATELET
Basophils Absolute: 0.1 10*3/uL (ref 0.0–0.1)
Basophils Relative: 0.7 % (ref 0.0–3.0)
Eosinophils Absolute: 0.2 10*3/uL (ref 0.0–0.7)
Eosinophils Relative: 1.9 % (ref 0.0–5.0)
HCT: 41.1 % (ref 36.0–46.0)
Hemoglobin: 13.7 g/dL (ref 12.0–15.0)
Lymphocytes Relative: 29.8 % (ref 12.0–46.0)
Lymphs Abs: 3.1 10*3/uL (ref 0.7–4.0)
MCHC: 33.2 g/dL (ref 30.0–36.0)
MCV: 88.9 fl (ref 78.0–100.0)
Monocytes Absolute: 0.6 10*3/uL (ref 0.1–1.0)
Monocytes Relative: 5.9 % (ref 3.0–12.0)
Neutro Abs: 6.4 10*3/uL (ref 1.4–7.7)
Neutrophils Relative %: 61.7 % (ref 43.0–77.0)
Platelets: 431 10*3/uL — ABNORMAL HIGH (ref 150.0–400.0)
RBC: 4.62 Mil/uL (ref 3.87–5.11)
RDW: 14.5 % (ref 11.5–15.5)
WBC: 10.4 10*3/uL (ref 4.0–10.5)

## 2019-10-01 MED ORDER — VITAMIN D (ERGOCALCIFEROL) 1.25 MG (50000 UNIT) PO CAPS: 50000.0000 [IU] | ORAL_CAPSULE | ORAL | 0 refills | Status: DC

## 2019-10-01 NOTE — Progress Notes (Addendum)
Subjective:    Patient ID: Jasmin Stewart, female    DOB: Oct 15, 1965, 54 y.o.   MRN: [id number]  HPI Pt in for follow up.  She feels good presently.  Pt had some GI symptoms.  See ED note below. hpi  Jasmin Stewart is a 54 y.o. female with a past medical history of prior C-section, prior hernia repair presenting to the ED with a chief complaint of abdominal cramping, vomiting, diarrhea for the past 3 days.  Reports numerous sick contacts at work with similar symptoms.  3 days ago came home from work and started having several episodes of nonbloody, nonbilious emesis.  This persisted but then developed watery diarrhea as well.  She denies any suspicious food intake prior to symptom onset.  She has not taken any medications to help with her symptoms.  Has only been able to eat a few things for breakfast since her symptoms began were.  Reports intermittent cramping abdominal pain without specific aggravating or alleviating factor.  Denies any urinary symptoms, pelvic complaints, vaginal complaints, chest pain, shortness of breath, cough. She has a history of gastric ulcer diagnosed in 2006, currently on Nexium.  A/P   54 year old female with past medical history of prior C-section and prior abdominal hernia repair presenting to the ED with a chief complaint of abdominal cramping, vomiting, diarrhea for the past 3 days.  Multiple sick contacts at work with similar symptoms.  Exam abdomen is tender in the mid abdomen without rebound or guarding.  She denies any urinary symptoms or pelvic complaints.  Work-up here significant for leukocytosis of 13, CMP, lipase and urinalysis are unremarkable.  CT of abdomen pelvis shows findings consistent with ileitis.  She also has incidental findings of a adrenal gland lesion as well as atherosclerotic changes of the aorta and iliac arteries.  She was informed of these findings.  She will follow up with her PCP regarding this.  Regarding her ileitis, will treat  symptomatically and have her follow-up with her prior GI specialist.  Patient symptoms controlled here and will be discharged home with return precautions.  All imaging, if done today, including plain films, CT scans, and ultrasounds, independently reviewed by me, and interpretations confirmed via formal radiology reads.  Patient is hemodynamically stable, in NAD, andable to ambulate in the ED. Evaluation does not show pathology that would require ongoing emergent intervention or inpatient treatment. I explained the diagnosis to the patient. Pain has been managed and has no complaints prior to discharge. Patient is comfortable with above plan and is stable for discharge at this time. All questions were answered prior to disposition. Strict return precautions for returning to the ED were discussed. Encouraged follow up with PCP.    After work up dc'd on bentyl. Pt has appointment with GI on October 24, 2019.  Ct report shows. IMPRESSION: 1. Fairly extensive inflammatory or infectious process involving the mid distal ileum all the way to the terminal ileum. 2. No other significant abdominal/pelvic findings, mass lesions or adenopathy. 3. Age advanced atherosclerotic calcifications involving the aorta and iliac arteries. 4. 2 cm right adrenal gland lesion, most likely a benign adenoma. Recommend follow-up noncontrast abdominal CT scan in 6 months to document stability. 5. Duplicated right renal collecting system.  Pt given bentyl on dc. No pain or diarrhea. By Sunday the 12 th felt better.     Review of Systems  Constitutional: Negative for chills, diaphoresis and fatigue.  HENT: Negative for dental problem.   Respiratory:  Negative for cough, chest tightness, wheezing and stridor.   Cardiovascular: Negative for chest pain and palpitations.  Gastrointestinal: Positive for abdominal pain. Negative for abdominal distention, constipation and diarrhea.  Genitourinary: Negative for dysuria.    Musculoskeletal: Negative for back pain and myalgias.  Skin: Negative for rash.  Neurological: Negative for dizziness, syncope, weakness, light-headedness and numbness.  Hematological: Negative for adenopathy. Does not bruise/bleed easily.  Psychiatric/Behavioral: Negative for behavioral problems and confusion.    Past Medical History:  Diagnosis Date  . Abdominal hernia   . Anemia   . Asthma   . Fibroid, uterine    never had surgery  . History of blood transfusion 1991; 1995   related to childbirth  . History of chicken pox    Age 63  . Hypercholesteremia   . Hypoglycemia   . Hypokalemia   . Hypotension   . Pneumonia 2013,2014, 2018  . PVC (premature ventricular contraction)   . Ulcer of esophagus      Social History   Socioeconomic History  . Marital status: Single    Spouse name: Not on file  . Number of children: Not on file  . Years of education: Not on file  . Highest education level: Not on file  Occupational History  . Not on file  Tobacco Use  . Smoking status: Current Every Day Smoker    Packs/day: 0.25    Years: 36.00    Pack years: 9.00    Types: Cigarettes  . Smokeless tobacco: Never Used  Vaping Use  . Vaping Use: Former  Substance and Sexual Activity  . Alcohol use: Not Currently    Alcohol/week: 0.0 standard drinks  . Drug use: No  . Sexual activity: Yes    Birth control/protection: Post-menopausal    Comment: 1st intercourse 54 yo-More than 5 partners  Other Topics Concern  . Not on file  Social History Narrative  . Not on file   Social Determinants of Health   Financial Resource Strain:   . Difficulty of Paying Living Expenses:   Food Insecurity:   . Worried About Charity fundraiser in the Last Year:   . Arboriculturist in the Last Year:   Transportation Needs:   . Film/video editor (Medical):   Marland Kitchen Lack of Transportation (Non-Medical):   Physical Activity:   . Days of Exercise per Week:   . Minutes of Exercise per Session:    Stress:   . Feeling of Stress :   Social Connections:   . Frequency of Communication with Friends and Family:   . Frequency of Social Gatherings with Friends and Family:   . Attends Religious Services:   . Active Member of Clubs or Organizations:   . Attends Archivist Meetings:   Marland Kitchen Marital Status:   Intimate Partner Violence:   . Fear of Current or Ex-Partner:   . Emotionally Abused:   Marland Kitchen Physically Abused:   . Sexually Abused:     Past Surgical History:  Procedure Laterality Date  . ABDOMINAL HERNIA REPAIR  2007; 06/05/2017   Suprapubic, Surgery 2007 with mesh VHR w/mesh; OPEN VENTRAL HERNIA REPAIR WITH MESH  . [place]; 1995; 1998  . ESOPHAGOGASTRODUODENOSCOPY  01/2017  . HERNIA REPAIR  2007; 06/05/2017  . INSERTION OF MESH N/A 06/05/2017   Procedure: INSERTION OF MESH;  Surgeon: Donnie Mesa, MD;  Location: [place];  Service: General;  Laterality: N/A;  . VENTRAL HERNIA REPAIR N/A 06/05/2017   Procedure:  OPEN VENTRAL HERNIA REPAIR WITH MESH;  Surgeon: Donnie Mesa, MD;  Location: [place];  Service: General;  Laterality: N/A;    Family History  Problem Relation Age of Onset  . Thyroid disease Mother   . Atrial fibrillation Mother   . Hyperlipidemia Mother   . Hyperlipidemia Sister   . Breast cancer Sister   . Hyperlipidemia Brother   . Thyroid disease Maternal Grandmother   . Glaucoma Maternal Grandmother   . Cataracts Maternal Grandmother   . Esophageal cancer Maternal Grandfather   . Hypertension Maternal Uncle   . Obesity Maternal Uncle   . Allergies Son        Hay Fever  . Healthy Daughter   . Colon cancer Neg Hx   . Colon polyps Neg Hx   . Stomach cancer Neg Hx   . Rectal cancer Neg Hx     No Known Allergies  Current Outpatient Medications on File Prior to Visit  Medication Sig Dispense Refill  . dicyclomine (BENTYL) 20 MG tablet Take 1 tablet (20 mg total) by mouth 2 (two) times daily. 10 tablet 0  . esomeprazole (NEXIUM) 20 MG  capsule Take 20 mg by mouth daily as needed.     . rosuvastatin (CRESTOR) 10 MG tablet Take 1 tablet (10 mg total) by mouth daily. 30 tablet 0  . Vitamin D, Ergocalciferol, (DRISDOL) 1.25 MG (50000 UNIT) CAPS capsule Take 1 capsule (50,000 Units total) by mouth every 7 (seven) days. 4 capsule 0   No current facility-administered medications on file prior to visit.    BP 126/82   Pulse 75   Resp 18   Ht 5\' 6"  (1.676 m)   Wt 230 lb (104.3 kg)   SpO2 99%   BMI 37.12 kg/m       Objective:   Physical Exam  General- No acute distress. Pleasant patient. Neck- Full range of motion, no jvd Lungs- Clear, even and unlabored. Heart- regular rate and rhythm. Neurologic- CNII- XII grossly intact.  Abdomen- soft, non-tender. Non distended. +bs, no rebound or guarding.  Back- no cva tenderness.     Assessment & Plan:  You do have recent inflammation of colon by ct abd/pelvis. Continue the bentyl. If you have recurrent episode as before then need to see you in office or go to ED. In that event you may need antibiotic. Then would contact gi office and see if they would move up your gi visit.  Will repeat cbc today to see if wbc still elevated.  For lesion of adrenal gland we need to repeat ct abd/pelvis in 6 months important.  Weight loss coinciding with wt loss program with specialist. More than expected. If continues accelerated then may need work up. But 4 lb weight gain. So suspect some degree of dehydration due to vomiting and diarrhea.  Follow up 3 weeks or as needed  Time spent with patient today was 30 minutes which consisted of chart review, discussing diagnosis, work up, treatment and documentation.

## 2019-10-01 NOTE — Patient Instructions (Addendum)
You do have recent inflammation of colon by ct abd/pelvis. Continue the bentyl. If you have recurrent episode as before then need to see you in office or go to ED. In that event you may need antibiotic. Then would contact gi office and see if they would move up your gi visit.  Will repeat cbc today to see if wbc still elevated.  For lesion of adrenal gland we need to repeat ct abd/pelvis in 6 months important.  Weight loss coinciding with wt loss program with specialist. More than expected. If continues accelerated then may need work up. But 4 lb weight gain since recovered. So suspect some degree of dehydration due to vomiting and diarrhea likely.  Follow up 3 weeks or as needed

## 2019-10-02 NOTE — Progress Notes (Signed)
Chief Complaint:   OBESITY Jasmin Stewart is here to discuss her progress with her obesity treatment plan along with follow-up of her obesity related diagnoses. Jasmin Stewart is on the Category 3 Plan and states she is following her eating plan approximately 100% of the time. Jasmin Stewart states she is walking 3,000 steps 7 times per week.  Today's visit was #: 5 Starting weight: 238 lbs Starting date: 07/30/2019 Today's weight: 227 lbs Today's date: 10/01/2019 Total lbs lost to date: 11 Total lbs lost since last in-office visit: 0  Interim History: Jasmin Stewart had an emergency department visit between last visit and today, secondary to severe abdominal pain and diarrhea, and vomiting. She notes lunch is getting a bit boring and she feels the meal plan is now natural and she doesn't have to think about it.  Subjective:   1. Vitamin D deficiency Jasmin Stewart denies nausea, vomiting, or muscle weakness, but she notes fatigue. She is on prescription Vit D.  2. Pre-diabetes Jasmin Stewart last A1c was 6.1 and insulin 14.4. She is not on metformin or on other medications.  3. At risk for osteoporosis Jasmin Stewart is at higher risk of osteopenia and osteoporosis due to Vitamin D deficiency.   Assessment/Plan:   1. Vitamin D deficiency Low Vitamin D level contributes to fatigue and are associated with obesity, breast, and colon cancer. We will refill prescription Vitamin D for 1 month. Jasmin Stewart will follow-up for routine testing of Vitamin D, at least 2-3 times per year to avoid over-replacement.  - Vitamin D, Ergocalciferol, (DRISDOL) 1.25 MG (50000 UNIT) CAPS capsule; Take 1 capsule (50,000 Units total) by mouth every 7 (seven) days.  Dispense: 4 capsule; Refill: 0  2. Pre-diabetes Jasmin Stewart will continue to work on weight loss, exercise, and decreasing simple carbohydrates to help decrease the risk of diabetes. We will follow up on labs in 2 months.  3. At risk for osteoporosis Jasmin Stewart was given approximately 15 minutes of osteoporosis prevention  counseling today. Jasmin Stewart is at risk for osteopenia and osteoporosis due to her Vitamin D deficiency. She was encouraged to take her Vitamin D and follow her higher calcium diet and increase strengthening exercise to help strengthen her bones and decrease her risk of osteopenia and osteoporosis.  Repetitive spaced learning was employed today to elicit superior memory formation and behavioral change.  4. Class 2 severe obesity with serious comorbidity and body mass index (BMI) of 36.0 to 36.9 in adult, unspecified obesity type [facility]) Jasmin Stewart is currently in the action stage of change. As such, her goal is to continue with weight loss efforts. She has agreed to the Category 3 Plan.   Exercise goals: No exercise has been prescribed at this time.  Behavioral modification strategies: increasing lean protein intake, increasing vegetables, meal planning and cooking strategies, keeping healthy foods in the home and planning for success.  Jasmin Stewart has agreed to follow-up with our clinic in 2 weeks. She was informed of the importance of frequent follow-up visits to maximize her success with intensive lifestyle modifications for her multiple health conditions.   Objective:   Blood pressure 107/70, pulse 74, temperature 98.1 F (36.7 C), temperature source Oral, height 5\' 6"  (1.676 m), weight 227 lb (103 kg), SpO2 97 %. Body mass index is 36.64 kg/m.  General: Cooperative, alert, well developed, in no acute distress. HEENT: Conjunctivae and lids unremarkable. Cardiovascular: Regular rhythm.  Lungs: Normal work of breathing. Neurologic: No focal deficits.   Lab Results  Component Value Date   CREATININE 0.70 09/19/2019  BUN 12 09/19/2019   NA 139 09/19/2019   K 3.9 09/19/2019   CL 104 09/19/2019   CO2 25 09/19/2019   Lab Results  Component Value Date   ALT 17 09/19/2019   AST 15 09/19/2019   ALKPHOS 81 09/19/2019   BILITOT 0.7 09/19/2019   Lab Results  Component Value Date   HGBA1C 6.1 (H)  07/30/2019   Lab Results  Component Value Date   INSULIN 14.4 07/30/2019   Lab Results  Component Value Date   TSH 1.64 05/27/2019   Lab Results  Component Value Date   CHOL 268 (H) 05/27/2019   HDL 48.50 05/27/2019   LDLCALC 193 (H) 05/27/2019   TRIG 134.0 05/27/2019   CHOLHDL 6 05/27/2019   Lab Results  Component Value Date   WBC 10.4 10/01/2019   HGB 13.7 10/01/2019   HCT 41.1 10/01/2019   MCV 88.9 10/01/2019   PLT 431.0 (H) 10/01/2019   No results found for: IRON, TIBC, FERRITIN  Attestation Statements:   Reviewed by clinician on day of visit: allergies, medications, problem list, medical history, surgical history, family history, social history, and previous encounter notes.   I, Trixie Dredge, am acting as transcriptionist for Coralie Common, MD.  I have reviewed the above documentation for accuracy and completeness, and I agree with the above. - Jinny Blossom, MD

## 2019-10-04 ENCOUNTER — Other Ambulatory Visit (INDEPENDENT_AMBULATORY_CARE_PROVIDER_SITE_OTHER): Payer: Self-pay | Admitting: Family Medicine

## 2019-10-04 DIAGNOSIS — E559 Vitamin D deficiency, unspecified: Secondary | ICD-10-CM

## 2019-10-07 ENCOUNTER — Encounter (INDEPENDENT_AMBULATORY_CARE_PROVIDER_SITE_OTHER): Payer: Self-pay | Admitting: Family Medicine

## 2019-10-16 ENCOUNTER — Other Ambulatory Visit: Payer: Self-pay

## 2019-10-16 ENCOUNTER — Encounter (INDEPENDENT_AMBULATORY_CARE_PROVIDER_SITE_OTHER): Payer: Self-pay | Admitting: Family Medicine

## 2019-10-16 ENCOUNTER — Ambulatory Visit (INDEPENDENT_AMBULATORY_CARE_PROVIDER_SITE_OTHER): Payer: BC Managed Care – PPO | Admitting: Family Medicine

## 2019-10-16 VITALS — BP 102/68 | HR 83 | Temp 98.2°F | Ht 66.0 in | Wt 225.0 lb

## 2019-10-16 DIAGNOSIS — Z6836 Body mass index (BMI) 36.0-36.9, adult: Secondary | ICD-10-CM

## 2019-10-16 DIAGNOSIS — E7849 Other hyperlipidemia: Secondary | ICD-10-CM | POA: Diagnosis not present

## 2019-10-16 DIAGNOSIS — E559 Vitamin D deficiency, unspecified: Secondary | ICD-10-CM

## 2019-10-22 ENCOUNTER — Ambulatory Visit: Payer: BC Managed Care – PPO | Admitting: Medical

## 2019-10-22 ENCOUNTER — Other Ambulatory Visit: Payer: Self-pay

## 2019-10-22 NOTE — Progress Notes (Signed)
   Subjective:    Patient ID: Jasmin Stewart, female    DOB: Aug 07, 1965, 54 y.o.   MRN: [id number]  HPI  Pt left due to family emergency. No charge.  Review of Systems     Objective:   Physical Exam        Assessment & Plan:

## 2019-10-22 NOTE — Progress Notes (Signed)
Chief Complaint:   OBESITY Jasmin Stewart is here to discuss her progress with her obesity treatment plan along with follow-up of her obesity related diagnoses. Jasmin Stewart is on the Category 3 Plan and states she is following her eating plan approximately 90% of the time. 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  Today's visit was #: 6 Starting weight: 238 lbs Starting date: 07/30/2019 Today's weight: 225 lbs Today's date: 10/16/2019 Total lbs lost to date: 13 Total lbs lost since last in-office visit: 2  Interim History: Jasmin Stewart did better over the last few weeks. She did have a few indulgences of hamburger bun and rosemary garlic bread with butter. She feels frustrated with weight loss of only 2 lbs. Next month she is moving into her own apartment.  Subjective:   1. Vitamin D deficiency Jasmin Stewart denies nausea, vomiting, or muscle weakness, but she notes fatigue. She is on prescription Vit D.  2. Other hyperlipidemia Jasmin Stewart's last LDL was 193 and she is on statin. She denies myalgias.  Assessment/Plan:   1. Vitamin D deficiency Low Vitamin D level contributes to fatigue and are associated with obesity, breast, and colon cancer. Jasmin Stewart agreed to continue taking prescription Vitamin D 50,000 IU every week, with no refill needed. She will follow-up for routine testing of Vitamin D, at least 2-3 times per year to avoid over-replacement.  2. Other hyperlipidemia Cardiovascular risk and specific lipid/LDL goals reviewed. We discussed several lifestyle modifications today and Jasmin Stewart will continue to work on diet, exercise and weight loss efforts. Jasmin Stewart will continue statin 10 mg daily, with no refill needed. Orders and follow up as documented in patient record.   Counseling Intensive lifestyle modifications are the first line treatment for this issue. . Dietary changes: Increase soluble fiber. Decrease simple carbohydrates. . Exercise changes:  Moderate to vigorous-intensity aerobic activity 150 minutes per week if tolerated. . Lipid-lowering medications: see documented in medical record.  3. Class 2 severe obesity with serious comorbidity and body mass index (BMI) of 36.0 to 36.9 in adult, unspecified obesity type [facility]) Jasmin Stewart is currently in the action stage of change. As such, her goal is to continue with weight loss efforts. She has agreed to the Category 3 Plan.   Exercise goals: No exercise has been prescribed at this time.  Behavioral modification strategies: increasing lean protein intake, increasing vegetables, meal planning and cooking strategies and keeping healthy foods in the home.  Jasmin Stewart has agreed to follow-up with our clinic in 2 weeks. She was informed of the importance of frequent follow-up visits to maximize her success with intensive lifestyle modifications for her multiple health conditions.   Objective:   Blood pressure 102/68, pulse 83, temperature 98.2 F (36.8 C), temperature source Oral, height 5\' 6"  (1.676 m), weight 225 lb (102.1 kg), SpO2 98 %. Body mass index is 36.32 kg/m.  General: Cooperative, alert, well developed, in no acute distress. HEENT: Conjunctivae and lids unremarkable. Cardiovascular: Regular rhythm.  Lungs: Normal work of breathing. Neurologic: No focal deficits.   Lab Results  Component Value Date   CREATININE 0.70 09/19/2019   BUN 12 09/19/2019   NA 139 09/19/2019   K 3.9 09/19/2019   CL 104 09/19/2019   CO2 25 09/19/2019   Lab Results  Component Value Date   ALT 17 09/19/2019   AST 15 09/19/2019   ALKPHOS 81 09/19/2019   BILITOT 0.7 09/19/2019   Lab  Results  Component Value Date   HGBA1C 6.1 (H) 07/30/2019   Lab Results  Component Value Date   INSULIN 14.4 07/30/2019   Lab Results  Component Value Date   TSH 1.64 05/27/2019   Lab Results  Component Value Date   CHOL 268 (H) 05/27/2019   HDL 48.50 05/27/2019   LDLCALC 193 (H) 05/27/2019   TRIG 134.0  05/27/2019   CHOLHDL 6 05/27/2019   Lab Results  Component Value Date   WBC 10.4 10/01/2019   HGB 13.7 10/01/2019   HCT 41.1 10/01/2019   MCV 88.9 10/01/2019   PLT 431.0 (H) 10/01/2019   No results found for: IRON, TIBC, FERRITIN  Attestation Statements:   Reviewed by clinician on day of visit: allergies, medications, problem list, medical history, surgical history, family history, social history, and previous encounter notes.  Time spent on visit including pre-visit chart review and post-visit care and charting was 15 minutes.    I, Trixie Dredge, am acting as transcriptionist for Coralie Common, MD.  I have reviewed the above documentation for accuracy and completeness, and I agree with the above. - Jinny Blossom, MD

## 2019-10-24 ENCOUNTER — Ambulatory Visit: Payer: BC Managed Care – PPO | Admitting: Nurse Practitioner

## 2019-10-24 ENCOUNTER — Encounter: Payer: Self-pay | Admitting: Nurse Practitioner

## 2019-10-24 ENCOUNTER — Other Ambulatory Visit (INDEPENDENT_AMBULATORY_CARE_PROVIDER_SITE_OTHER): Payer: BC Managed Care – PPO

## 2019-10-24 VITALS — BP 110/80 | HR 81 | Ht 66.5 in | Wt 230.2 lb

## 2019-10-24 DIAGNOSIS — K219 Gastro-esophageal reflux disease without esophagitis: Secondary | ICD-10-CM

## 2019-10-24 DIAGNOSIS — K529 Noninfective gastroenteritis and colitis, unspecified: Secondary | ICD-10-CM | POA: Diagnosis not present

## 2019-10-24 LAB — CBC WITH DIFFERENTIAL/PLATELET
Basophils Absolute: 0.1 10*3/uL (ref 0.0–0.1)
Basophils Relative: 1.2 % (ref 0.0–3.0)
Eosinophils Absolute: 0.2 10*3/uL (ref 0.0–0.7)
Eosinophils Relative: 2 % (ref 0.0–5.0)
HCT: 40 % (ref 36.0–46.0)
Hemoglobin: 13.2 g/dL (ref 12.0–15.0)
Lymphocytes Relative: 35.7 % (ref 12.0–46.0)
Lymphs Abs: 4 10*3/uL (ref 0.7–4.0)
MCHC: 33 g/dL (ref 30.0–36.0)
MCV: 89 fl (ref 78.0–100.0)
Monocytes Absolute: 0.6 10*3/uL (ref 0.1–1.0)
Monocytes Relative: 5.5 % (ref 3.0–12.0)
Neutro Abs: 6.3 10*3/uL (ref 1.4–7.7)
Neutrophils Relative %: 55.6 % (ref 43.0–77.0)
Platelets: 429 10*3/uL — ABNORMAL HIGH (ref 150.0–400.0)
RBC: 4.5 Mil/uL (ref 3.87–5.11)
RDW: 14.5 % (ref 11.5–15.5)
WBC: 11.3 10*3/uL — ABNORMAL HIGH (ref 4.0–10.5)

## 2019-10-24 LAB — BASIC METABOLIC PANEL
BUN: 14 mg/dL (ref 6–23)
CO2: 30 mEq/L (ref 19–32)
Calcium: 9.9 mg/dL (ref 8.4–10.5)
Chloride: 102 mEq/L (ref 96–112)
Creatinine, Ser: 0.75 mg/dL (ref 0.40–1.20)
GFR: 98.14 mL/min (ref >60.00)
Glucose, Bld: 104 mg/dL — ABNORMAL HIGH (ref 70–99)
Potassium: 4 mEq/L (ref 3.5–5.1)
Sodium: 140 mEq/L (ref 135–145)

## 2019-10-24 LAB — C-REACTIVE PROTEIN: CRP: <1 mg/dL (ref 0.5–20.0)

## 2019-10-24 NOTE — Progress Notes (Signed)
Addendum: Reviewed and agree with assessment and management plan. Evanee Lubrano M, MD  

## 2019-10-24 NOTE — Patient Instructions (Signed)
If you are age 52 or older, your body mass index should be between 23-30. Your Body mass index is 36.61 kg/m. If this is out of the aforementioned range listed, please consider follow up with your Primary Care Provider.  If you are age 56 or younger, your body mass index should be between 19-25. Your Body mass index is 36.61 kg/m. If this is out of the aformentioned range listed, please consider follow up with your Primary Care Provider.   Your provider has requested that you go to the basement level for lab work before leaving today. Press "B" on the elevator. The lab is located at the first door on the left as you exit the elevator.   You have been scheduled for a CT enterography of the abdomen and pelvis with contrast on 11/04/2019 at 11:00 at [facility]. Please arrive at 9:15 at radiology on the first floor to register and start with your prep. If you are unable to make this appointment please contact [phone].  Continue taking your nexium daily  Due to recent changes in healthcare laws, you may see the results of your imaging and laboratory studies on MyChart before your provider has had a chance to review them.  We understand that in some cases there may be results that are confusing or concerning to you. Not all laboratory results come back in the same time frame and the provider may be waiting for multiple results in order to interpret others.  Please give [place] 48 hours in order for your provider to thoroughly review all the results before contacting the office for clarification of your results.

## 2019-10-24 NOTE — Progress Notes (Signed)
.    10/24/2019 Jasmin Stewart [id number] 06/21/1965   Chief Complaint: Abnormal abdominal/pelvic CT scan   History of Present Illness: Jasmin Stewart is a 54 year old female with past medical history of asthma, upper cholesterolemia, anemia, vitamin D deficiency, PUD and colon polyps. Past C section and abdominal hernia repair.   She presents today for further evaluation regarding an abnormal abdominal/pelvic CAT scan which showed evidence of ileitis.  On September 16, 2019 around 3:15 PM she developed abrupt onset of central abdominal pain.  She vomited up the breakfast and lunch she ate earlier in the day, vomited at least 3 times.  No hematemesis.  She left work and vomited 2 or 3 more times until her stomach was completely empty.  She developed nonbloody watery diarrhea later that night. Thursday, 09/18/2019 her abdominal pain and diarrhea was much less and she went to work. She went home around 5:30 PM and she developed recurrence of her generalized abdominal pain, N/V and diarrhea. Her symptoms persisted and she presented to [facility] emergency room on 09/19/2019.  Labs in the ED showed a WBC 13.1. BUN/Cr and LFTs were normal.  She received IV fluids, Morphine, Ondansetron and Bentyl.  An abdominal/pelvic CT showed extensive inflammatory or infectious process involving the mid distal ileum all the way to the terminal ileum.  A 2 cm right adrenal gland mass was identified as well.  She was discharged home with a prescription for Dicyclomine 20 mg twice daily as needed.  Her nausea and vomiting abated following her treatment in the ED.  Her generalized abdominal pain and diarrhea resolved within 48 hours.  Currently, she denies having any nausea or vomiting.  No upper or lower abdominal pain.  She is passing a normal formed brown bowel movement daily.  She is attempting to lose weight through the wellness program.  She has lost 13 pounds in the past 6 weeks.  She underwent a colonoscopy by Dr. Pyrtle  06/07/2018 which identified 5 tubular adenomatous polyp/sessile serrated and hyperplastic polyps  which were removed.  A repeat colonoscopy in 3 years was recommended.  No family history of inflammatory bowel disease or colorectal cancer.  No NSAID use.  She uses Tylenol only as needed.  Her stress level is elevated.  She has occasional epigastric discomfort which she attributes to having a ulcer in the past found per EGD done in [place] in 2006.  She underwent an EGD 11/02/2016 which showed mild gastritis and peptic duodenitis.  No evidence of H. Pylori. She takes Nexium OTC as needed.  Maternal grandfather had esophageal cancer.   Labs 09/19/2019: WBC 13.1.  Hemoglobin 14.5.  Hematocrit 44.8.  MCV 90.7.  Platelet 428.  Sodium 139.  Potassium 3.9.  BUN 12.  Creatinine 0.70.  Alk phos 81.  Albumin 3.8.  Lipase 19.  AST 15.  ALT 17.  Total bili 0.7.  Abdominal/pelvic CT with contrast 09/19/2019: 1. Fairly extensive inflammatory or infectious process involving the mid distal ileum all the way to the terminal ileum. 2. No other significant abdominal/pelvic findings, mass lesions or adenopathy. 3. Age advanced atherosclerotic calcifications involving the aorta and iliac arteries. 4. 2 cm right adrenal gland lesion, most likely a benign adenoma. Recommend follow-up noncontrast abdominal CT scan in 6 months to document stability. 5. Duplicated right renal collecting system. 6. Aortic atherosclerosis.  Colonoscopy 06/07/2018: - Two 4 to 7 mm polyps in the transverse colon, removed with a cold snare. Resected and retrieved. - One   5 mm polyp in the descending colon, removed with a cold snare. Resected and retrieved. - Two 4 to 5 mm polyps in the sigmoid colon, removed with a cold snare. Resected and retrieved. - Small internal hemorrhoids.  1. Surgical [P], colon, transverse, polyp (2) - TUBULAR ADENOMA(S) WITHOUT HIGH-GRADE DYSPLASIA OR MALIGNANCY 2. Surgical [P], colon, sigmoid and descending, polyp  (3) - TUBULAR ADENOMA WITHOUT HIGH-GRADE DYSPLASIA OR MALIGNANCY - SESSILE SERRATED POLYP WITHOUT CYTOLOGIC DYSPLASIA - HYPERPLASTIC POLYP  EGD 11/02/2016:  - Normal esophagus. - Normal stomach. Biopsied. - Duodenitis. Biopsied. - Normal second portion of the duodenum.  Current Medications, Allergies, Past Medical History, Past Surgical History, Family History and Social History were reviewed in [facility] electronic medical record.   Physical Exam: BP 110/80   Pulse 81   Ht 5' 6.5" (1.689 m)   Wt 230 lb 4 oz (104.4 kg)   BMI 36.61 kg/m  General: Well developed 54 year old female in no acute distress. Head: Normocephalic and atraumatic. Eyes: No scleral icterus. Conjunctiva pink . Ears: Normal auditory acuity. Mouth: Dentition intact. No ulcers or lesions.  Lungs: Clear throughout to auscultation. Heart: Regular rate and rhythm, no murmur. Abdomen: Soft, nontender and nondistended. No masses or hepatomegaly. Normal bowel sounds x 4 quadrants.  Rectal: Deferred Musculoskeletal: Symmetrical with no gross deformities. Extremities: No edema. Neurological: Alert oriented x 4. No focal deficits.  Psychological: Alert and cooperative. Normal mood and affect  Assessment and Recommendations:  1.  54-year-old female with most likely infectious gastroenteritis 09/19/2019. Abdominal/pelvic CT showed extensive inflammatory verses infectious process involving the mid distal ileum all the way to the terminal ileum. N/V/D and abdominal pain have completely resolved. Colonoscopy 05/2018 showed multiple polyps, no colitis or IBD.  -CBC, CMP and CRP -CT enterography -Patient to call our office if N/V/D or abdominal pain recurs   2. History of  Tubular adenomatous/sessile serrated and hyperplastic polyps. -Next colonoscopy due 05/2021  3. 2cm right adrenal lesion -Patient to follow up with PCP regarding adrenal lesion identified per CTAP 09/19/2019  4. History of gastritis, peptic  duodenitis. Intermittent epigastric pain. -Nexium 20mg one po daily for now -Patient to call office if symptoms worsen  5. Vitamin D deficiency     

## 2019-10-29 ENCOUNTER — Ambulatory Visit (INDEPENDENT_AMBULATORY_CARE_PROVIDER_SITE_OTHER): Payer: BC Managed Care – PPO | Admitting: Family Medicine

## 2019-10-29 ENCOUNTER — Encounter (INDEPENDENT_AMBULATORY_CARE_PROVIDER_SITE_OTHER): Payer: Self-pay | Admitting: Family Medicine

## 2019-10-29 ENCOUNTER — Other Ambulatory Visit: Payer: Self-pay

## 2019-10-29 VITALS — BP 122/72 | HR 78 | Temp 98.3°F | Ht 66.0 in | Wt 227.0 lb

## 2019-10-29 DIAGNOSIS — E559 Vitamin D deficiency, unspecified: Secondary | ICD-10-CM | POA: Diagnosis not present

## 2019-10-29 DIAGNOSIS — Z6836 Body mass index (BMI) 36.0-36.9, adult: Secondary | ICD-10-CM

## 2019-10-29 DIAGNOSIS — R7303 Prediabetes: Secondary | ICD-10-CM

## 2019-10-29 DIAGNOSIS — Z9189 Other specified personal risk factors, not elsewhere classified: Secondary | ICD-10-CM | POA: Diagnosis not present

## 2019-10-29 MED ORDER — VITAMIN D (ERGOCALCIFEROL) 1.25 MG (50000 UNIT) PO CAPS: 50000.0000 [IU] | ORAL_CAPSULE | ORAL | 0 refills | Status: DC

## 2019-10-30 NOTE — Progress Notes (Signed)
Chief Complaint:   OBESITY Jasmin Stewart is here to discuss her progress with her obesity treatment plan along with follow-up of her obesity related diagnoses. Jasmin Stewart is on the Category 3 Plan and states she is following her eating plan approximately 60-70% of the time. Jasmin Stewart states she is walking for 30 minutes 5 times per week, bands for 10 minutes, 1 time per week, and exercises for 10 minutes 7 times per week.  Today's visit was #: 7 Starting weight: 238 lbs Starting date: 07/30/2019 Today's weight: 227 lbs Today's date: 10/29/2019 Total lbs lost to date: 11 Total lbs lost since last in-office visit: 0  Interim History: Jasmin Stewart is feeling very fatigued and bloated (around the time she would have her menses). 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 She is very conscious of calories and protein of each food she is eating. She voices that she is wondering how to incorporate eating out.  Subjective:   1. Vitamin D deficiency Jasmin Stewart denies nausea, vomiting, or muscle weakness, but she notes fatigue. She is on prescription Vit D.  2. Pre-diabetes Jasmin Stewart's last A1c was 6.1 and insulin 14.4. She is not on metformin.  3. At risk for osteoporosis Jasmin Stewart is at higher risk of osteopenia and osteoporosis due to Vitamin D deficiency.   Assessment/Plan:   1. Vitamin D deficiency Low Vitamin D level contributes to fatigue and are associated with obesity, breast, and colon cancer. We will refill prescription Vitamin D for 1 month. Jasmin Stewart will follow-up for routine testing of Vitamin D, at least 2-3 times per year to avoid over-replacement.  - Vitamin D, Ergocalciferol, (DRISDOL) 1.25 MG (50000 UNIT) CAPS capsule; Take 1 capsule (50,000 Units total) by mouth every 7 (seven) days.  Dispense: 4 capsule; Refill: 0  2. Pre-diabetes Good blood sugar control is important to decrease the likelihood of diabetic complications such as nephropathy, neuropathy, limb loss, blindness, coronary artery disease, and death.  Intensive lifestyle modification including diet, exercise and weight loss are the first line of treatment for diabetes. We will follow up on labs in 1 month, and Jasmin Stewart will continue to follow up as directed.  3. At risk for osteoporosis Jasmin Stewart was given approximately 15 minutes of osteoporosis prevention counseling today. Jasmin Stewart is at risk for osteopenia and osteoporosis due to her Vitamin D deficiency. She was encouraged to take her Vitamin D and follow her higher calcium diet and increase strengthening exercise to help strengthen her bones and decrease her risk of osteopenia and osteoporosis.  Repetitive spaced learning was employed today to elicit superior memory formation and behavioral change.  4. Class 2 severe obesity with serious comorbidity and body mass index (BMI) of 36.0 to 36.9 in adult, unspecified obesity type Jasmin Stewart) Jasmin Stewart is currently in the action stage of change. As such, her goal is to continue with weight loss efforts. She has agreed to the Category 3 Plan and keeping a food journal and adhering to recommended goals of 450-600 calories and 40+ grams of protein at supper daily.   Exercise goals: As is.  Behavioral modification strategies: increasing lean protein intake, increasing vegetables, meal planning and cooking strategies, keeping healthy foods in the home and planning for success.  Jasmin Stewart has agreed to follow-up with our clinic in 2 weeks. She was informed of the importance of frequent follow-up visits to maximize her success with intensive lifestyle modifications for her multiple health conditions.   Objective:   Blood pressure 122/72, pulse 78, temperature 98.3 F (  36.8 C), temperature source Oral, height 5\' 6"  (1.676 m), weight 227 lb (103 kg), SpO2 100 %. Body mass index is 36.64 kg/m.  General: Cooperative, alert, well developed, in no acute distress. HEENT: Conjunctivae and lids unremarkable. Cardiovascular: Regular rhythm.  Lungs: Normal work of breathing. Neurologic:  No focal deficits.   Lab Results  Component Value Date   CREATININE 0.75 10/24/2019   BUN 14 10/24/2019   NA 140 10/24/2019   K 4.0 10/24/2019   CL 102 10/24/2019   CO2 30 10/24/2019   Lab Results  Component Value Date   ALT 17 09/19/2019   AST 15 09/19/2019   ALKPHOS 81 09/19/2019   BILITOT 0.7 09/19/2019   Lab Results  Component Value Date   HGBA1C 6.1 (H) 07/30/2019   Lab Results  Component Value Date   INSULIN 14.4 07/30/2019   Lab Results  Component Value Date   TSH 1.64 05/27/2019   Lab Results  Component Value Date   CHOL 268 (H) 05/27/2019   HDL 48.50 05/27/2019   LDLCALC 193 (H) 05/27/2019   TRIG 134.0 05/27/2019   CHOLHDL 6 05/27/2019   Lab Results  Component Value Date   WBC 11.3 (H) 10/24/2019   HGB 13.2 10/24/2019   HCT 40.0 10/24/2019   MCV 89.0 10/24/2019   PLT 429.0 (H) 10/24/2019   No results found for: IRON, TIBC, FERRITIN  Attestation Statements:   Reviewed by clinician on day of visit: allergies, medications, problem list, medical history, surgical history, family history, social history, and previous encounter notes.   I, Trixie Dredge, am acting as transcriptionist for Coralie Common, MD.  I have reviewed the above documentation for accuracy and completeness, and I agree with the above. - Jinny Blossom, MD

## 2019-11-04 ENCOUNTER — Other Ambulatory Visit: Payer: Self-pay

## 2019-11-04 ENCOUNTER — Encounter (HOSPITAL_COMMUNITY): Payer: Self-pay

## 2019-11-04 ENCOUNTER — Ambulatory Visit (HOSPITAL_COMMUNITY)
Admission: RE | Admit: 2019-11-04 | Discharge: 2019-11-04 | Disposition: A | Discharge status: Home / Self Care | Payer: BC Managed Care – PPO | Source: Ambulatory Visit | Attending: Nurse Practitioner | Admitting: Nurse Practitioner

## 2019-11-04 DIAGNOSIS — K529 Noninfective gastroenteritis and colitis, unspecified: Secondary | ICD-10-CM | POA: Diagnosis not present

## 2019-11-04 DIAGNOSIS — I700 Atherosclerosis of aorta: Secondary | ICD-10-CM | POA: Diagnosis not present

## 2019-11-04 DIAGNOSIS — E278 Other specified disorders of adrenal gland: Secondary | ICD-10-CM | POA: Diagnosis not present

## 2019-11-04 DIAGNOSIS — D3500 Benign neoplasm of unspecified adrenal gland: Secondary | ICD-10-CM | POA: Diagnosis not present

## 2019-11-04 DIAGNOSIS — Q630 Accessory kidney: Secondary | ICD-10-CM | POA: Diagnosis not present

## 2019-11-04 MED ORDER — IOHEXOL 300 MG/ML  SOLN
100.0000 mL | Freq: Once | INTRAMUSCULAR | Status: AC | PRN
  Administered 2019-11-04: 100 mL via INTRAVENOUS

## 2019-11-04 MED ORDER — BARIUM SULFATE 0.1 % PO SUSP
ORAL | Status: AC
  Filled 2019-11-04: qty 3

## 2019-11-13 ENCOUNTER — Other Ambulatory Visit: Payer: Self-pay

## 2019-11-13 ENCOUNTER — Encounter (INDEPENDENT_AMBULATORY_CARE_PROVIDER_SITE_OTHER): Payer: Self-pay | Admitting: Family Medicine

## 2019-11-13 ENCOUNTER — Ambulatory Visit (INDEPENDENT_AMBULATORY_CARE_PROVIDER_SITE_OTHER): Payer: BC Managed Care – PPO | Admitting: Family Medicine

## 2019-11-13 VITALS — BP 125/74 | HR 78 | Temp 98.1°F | Ht 66.0 in | Wt 226.0 lb

## 2019-11-13 DIAGNOSIS — E559 Vitamin D deficiency, unspecified: Secondary | ICD-10-CM | POA: Diagnosis not present

## 2019-11-13 DIAGNOSIS — Z9189 Other specified personal risk factors, not elsewhere classified: Secondary | ICD-10-CM | POA: Diagnosis not present

## 2019-11-13 DIAGNOSIS — Z6836 Body mass index (BMI) 36.0-36.9, adult: Secondary | ICD-10-CM

## 2019-11-13 DIAGNOSIS — E7849 Other hyperlipidemia: Secondary | ICD-10-CM | POA: Diagnosis not present

## 2019-11-13 MED ORDER — ROSUVASTATIN CALCIUM 10 MG PO TABS: 10.0000 mg | ORAL_TABLET | Freq: Every day | ORAL | 0 refills | Status: DC

## 2019-11-17 NOTE — Progress Notes (Signed)
Chief Complaint:   OBESITY Jasmin Stewart is here to discuss her progress with her obesity treatment plan along with follow-up of her obesity related diagnoses. Jasmin Stewart is on the Category 3 Plan and keeping a food journal and adhering to recommended goals of 450-600 calories and 40+ grams of protein daily and states she is following her eating plan approximately 85% of the time. Jasmin Stewart states she is kayaking, in the pool, and walking for 3 hours-4.5 hours 1 and 7 times per week.  Today's visit was #: 8 Starting weight: 238 lbs Starting date: 07/30/2019 Today's weight: 226 lbs Today's date: 11/13/2019 Total lbs lost to date: 12 Total lbs lost since last in-office visit: 1  Interim History: Jasmin Stewart takes control of her apartment in 12 days which she is very excited about. She has also had a very emotional filled last week in relevance to her relationship. She denies hunger. She is an emotional non-eater and may not have eaten everything on plan over last week.  Subjective:   1. Other hyperlipidemia Itzamar denies myalgias on statin. Last LFTs were previously within normal limits.  2. Vitamin D deficiency Jasmin Stewart denies nausea, vomiting, or muscle weakness, but she notes fatigue. She is on prescription Vit D.  3. At risk for heart disease Jasmin Stewart is at a higher than average risk for cardiovascular disease due to obesity.   Assessment/Plan:   1. Other hyperlipidemia Cardiovascular risk and specific lipid/LDL goals reviewed. We discussed several lifestyle modifications today. Jasmin Stewart will continue to work on diet, exercise and weight loss efforts. We will refill Crestor for 1 month. Orders and follow up as documented in patient record.   Counseling Intensive lifestyle modifications are the first line treatment for this issue.  Dietary changes: Increase soluble fiber. Decrease simple carbohydrates.  Exercise changes: Moderate to vigorous-intensity aerobic activity 150 minutes per week if tolerated.  Lipid-lowering  medications: see documented in medical record.  - rosuvastatin (CRESTOR) 10 MG tablet; Take 1 tablet (10 mg total) by mouth daily.  Dispense: 30 tablet; Refill: 0  2. Vitamin D deficiency Low Vitamin D level contributes to fatigue and are associated with obesity, breast, and colon cancer. Jasmin Stewart agreed to continue taking prescription Vitamin D 50,000 IU every week, no refill needed. She will follow-up for routine testing of Vitamin D, at least 2-3 times per year to avoid over-replacement.  3. At risk for heart disease Jasmin Stewart was given approximately 15 minutes of coronary artery disease prevention counseling today. She is 54 y.o. female and has risk factors for heart disease including obesity. We discussed intensive lifestyle modifications today with an emphasis on specific weight loss instructions and strategies.   Repetitive spaced learning was employed today to elicit superior memory formation and behavioral change.  4. Class 2 severe obesity with serious comorbidity and body mass index (BMI) of 36.0 to 36.9 in adult, unspecified obesity type Jasmin Stewart) Jasmin Stewart is currently in the action stage of change. As such, her goal is to continue with weight loss efforts. She has agreed to the Category 3 Plan.   Exercise goals: As is.  Behavioral modification strategies: increasing lean protein intake, increasing vegetables, meal planning and cooking strategies, keeping healthy foods in the home and planning for success.  Jasmin Stewart has agreed to follow-up with our clinic in 2 to 3 weeks. She was informed of the importance of frequent follow-up visits to maximize her success with intensive lifestyle modifications for her multiple health conditions.   Objective:   Blood pressure 125/74,  pulse 78, temperature 98.1 F (36.7 C), temperature source Oral, height 5\' 6"  (1.676 m), weight 226 lb (102.5 kg), SpO2 96 %. Body mass index is 36.48 kg/m.  General: Cooperative, alert, well developed, in no acute distress. HEENT:  Conjunctivae and lids unremarkable. Cardiovascular: Regular rhythm.  Lungs: Normal work of breathing. Neurologic: No focal deficits.   Lab Results  Component Value Date   CREATININE 0.75 10/24/2019   BUN 14 10/24/2019   NA 140 10/24/2019   K 4.0 10/24/2019   CL 102 10/24/2019   CO2 30 10/24/2019   Lab Results  Component Value Date   ALT 17 09/19/2019   AST 15 09/19/2019   ALKPHOS 81 09/19/2019   BILITOT 0.7 09/19/2019   Lab Results  Component Value Date   HGBA1C 6.1 (H) 07/30/2019   Lab Results  Component Value Date   INSULIN 14.4 07/30/2019   Lab Results  Component Value Date   TSH 1.64 05/27/2019   Lab Results  Component Value Date   CHOL 268 (H) 05/27/2019   HDL 48.50 05/27/2019   LDLCALC 193 (H) 05/27/2019   TRIG 134.0 05/27/2019   CHOLHDL 6 05/27/2019   Lab Results  Component Value Date   WBC 11.3 (H) 10/24/2019   HGB 13.2 10/24/2019   HCT 40.0 10/24/2019   MCV 89.0 10/24/2019   PLT 429.0 (H) 10/24/2019   No results found for: IRON, TIBC, FERRITIN  Attestation Statements:   Reviewed by clinician on day of visit: allergies, medications, problem list, medical history, surgical history, family history, social history, and previous encounter notes.   I, Trixie Dredge, am acting as transcriptionist for Coralie Common, MD.  I have reviewed the above documentation for accuracy and completeness, and I agree with the above. - Jinny Blossom, MD

## 2019-11-23 ENCOUNTER — Encounter (INDEPENDENT_AMBULATORY_CARE_PROVIDER_SITE_OTHER): Payer: Self-pay | Admitting: Family Medicine

## 2019-11-24 NOTE — Telephone Encounter (Signed)
FYI

## 2019-11-25 ENCOUNTER — Ambulatory Visit (INDEPENDENT_AMBULATORY_CARE_PROVIDER_SITE_OTHER): Payer: BC Managed Care – PPO | Admitting: Family Medicine

## 2019-11-29 ENCOUNTER — Other Ambulatory Visit (INDEPENDENT_AMBULATORY_CARE_PROVIDER_SITE_OTHER): Payer: Self-pay | Admitting: Family Medicine

## 2019-11-29 DIAGNOSIS — E559 Vitamin D deficiency, unspecified: Secondary | ICD-10-CM

## 2020-01-30 DIAGNOSIS — Z1231 Encounter for screening mammogram for malignant neoplasm of breast: Secondary | ICD-10-CM | POA: Diagnosis not present

## 2020-04-15 ENCOUNTER — Encounter: Payer: BC Managed Care – PPO | Admitting: Obstetrics and Gynecology

## 2020-04-19 DIAGNOSIS — Z20822 Contact with and (suspected) exposure to covid-19: Secondary | ICD-10-CM | POA: Diagnosis not present

## 2020-04-21 ENCOUNTER — Encounter: Payer: BC Managed Care – PPO | Admitting: Medical

## 2020-05-07 ENCOUNTER — Encounter: Payer: BC Managed Care – PPO | Admitting: Medical

## 2020-06-02 ENCOUNTER — Other Ambulatory Visit: Payer: Self-pay | Admitting: *Deleted

## 2020-06-02 MED ORDER — NITROGLYCERIN 0.2 MG/HR TD PT24: MEDICATED_PATCH | TRANSDERMAL | 0 refills | Status: DC

## 2020-06-18 ENCOUNTER — Encounter: Payer: Self-pay | Admitting: Medical

## 2020-06-18 ENCOUNTER — Ambulatory Visit (INDEPENDENT_AMBULATORY_CARE_PROVIDER_SITE_OTHER): Payer: BC Managed Care – PPO | Admitting: Medical

## 2020-06-18 ENCOUNTER — Other Ambulatory Visit: Payer: Self-pay

## 2020-06-18 VITALS — BP 130/89 | HR 89 | Temp 98.2°F | Resp 18 | Ht 66.0 in | Wt 243.0 lb

## 2020-06-18 DIAGNOSIS — Z1159 Encounter for screening for other viral diseases: Secondary | ICD-10-CM | POA: Diagnosis not present

## 2020-06-18 DIAGNOSIS — F17200 Nicotine dependence, unspecified, uncomplicated: Secondary | ICD-10-CM | POA: Diagnosis not present

## 2020-06-18 DIAGNOSIS — R739 Hyperglycemia, unspecified: Secondary | ICD-10-CM | POA: Diagnosis not present

## 2020-06-18 DIAGNOSIS — Z0000 Encounter for general adult medical examination without abnormal findings: Secondary | ICD-10-CM | POA: Diagnosis not present

## 2020-06-18 NOTE — Progress Notes (Signed)
Subjective:    Patient ID: Jasmin Stewart, female    DOB: [birth year], 55 y.o.   MRN: [id number]  HPI  Pt in for wellness exam.  Pt working [employer]. Exercising since Monday. Treadmill and bike. Some light weights. Also dieting eating healthy. Avoiding carbs.   Pt had mammogram with [facility]. She states up to date.  States. Done 02-02-20  Pt will get pap with [facility].   Review of Systems  Constitutional: Negative for chills, fatigue and fever.  HENT: Negative for congestion and drooling.   Respiratory: Negative for cough, choking, chest tightness, shortness of breath and wheezing.   Cardiovascular: Negative for chest pain and palpitations.  Gastrointestinal: Negative for abdominal pain, diarrhea, nausea and vomiting.  Genitourinary: Negative for difficulty urinating, dyspareunia, dysuria, flank pain and frequency.  Musculoskeletal: Negative for back pain and neck pain.  Skin: Negative for rash.  Neurological: Negative for dizziness, speech difficulty, weakness, numbness and headaches.  Hematological: Negative for adenopathy. Does not bruise/bleed easily.  Psychiatric/Behavioral: Negative for behavioral problems and confusion.    Past Medical History:  Diagnosis Date  . Abdominal hernia   . Anemia   . Asthma   . Fibroid, uterine    never had surgery  . History of blood transfusion 1991; 1995   related to childbirth  . History of chicken pox    Age 59  . Hypercholesteremia   . Hypoglycemia   . Hypokalemia   . Hypotension   . Pneumonia 2013,2014, 2018  . PVC (premature ventricular contraction)   . Ulcer of esophagus      Social History   Socioeconomic History  . Marital status: Single    Spouse name: Not on file  . Number of children: Not on file  . Years of education: Not on file  . Highest education level: Not on file  Occupational History  . Not on file  Tobacco Use  . Smoking status: Current Every Day Smoker    Packs/day: 0.25    Years: 36.00     Pack years: 9.00    Types: Cigarettes  . Smokeless tobacco: Never Used  Vaping Use  . Vaping Use: Former  Substance and Sexual Activity  . Alcohol use: Not Currently    Alcohol/week: 0.0 standard drinks  . Drug use: No  . Sexual activity: Yes    Birth control/protection: Post-menopausal    Comment: 1st intercourse 55 yo-More than 5 partners  Other Topics Concern  . Not on file  Social History Narrative  . Not on file   Social Determinants of Health   Financial Resource Strain: Not on file  Food Insecurity: Not on file  Transportation Needs: Not on file  Physical Activity: Not on file  Stress: Not on file  Social Connections: Not on file  Intimate Partner Violence: Not on file    Past Surgical History:  Procedure Laterality Date  . ABDOMINAL HERNIA REPAIR  2007; 06/05/2017   Suprapubic, Surgery 2007 with mesh VHR w/mesh; OPEN VENTRAL HERNIA REPAIR WITH MESH  . [facility]; 1995; 1998  . ESOPHAGOGASTRODUODENOSCOPY  01/2017  . HERNIA REPAIR  2007; 06/05/2017  . INSERTION OF MESH N/A 06/05/2017   Procedure: INSERTION OF MESH;  Surgeon: Donnie Mesa, MD;  Location: [facility];  Service: General;  Laterality: N/A;  . VENTRAL HERNIA REPAIR N/A 06/05/2017   Procedure: OPEN VENTRAL HERNIA REPAIR WITH MESH;  Surgeon: Donnie Mesa, MD;  Location: [facility];  Service: General;  Laterality: N/A;  Family History  Problem Relation Age of Onset  . Thyroid disease Mother   . Atrial fibrillation Mother   . Hyperlipidemia Mother   . Hyperlipidemia Sister   . Breast cancer Sister   . Hyperlipidemia Brother   . Thyroid disease Maternal Grandmother   . Glaucoma Maternal Grandmother   . Cataracts Maternal Grandmother   . Esophageal cancer Maternal Grandfather   . Hypertension Maternal Uncle   . Obesity Maternal Uncle   . Allergies Son        Hay Fever  . Healthy Daughter   . Colon cancer Neg Hx   . Colon polyps Neg Hx   . Stomach cancer Neg Hx   . Rectal cancer Neg Hx      No Known Allergies  Current Outpatient Medications on File Prior to Visit  Medication Sig Dispense Refill  . esomeprazole (NEXIUM) 20 MG capsule Take 20 mg by mouth daily as needed.     . nitroGLYCERIN (NITRODUR - DOSED IN MG/24 HR) 0.2 mg/hr patch Cut and apply 1/4 patch to most painful area q24h. 30 patch 0  . rosuvastatin (CRESTOR) 10 MG tablet Take 1 tablet (10 mg total) by mouth daily. 30 tablet 0  . Vitamin D, Ergocalciferol, (DRISDOL) 1.25 MG (50000 UNIT) CAPS capsule Take 1 capsule (50,000 Units total) by mouth every 7 (seven) days. 4 capsule 0   No current facility-administered medications on file prior to visit.    BP 130/89   Pulse 89   Temp 98.2 F (36.8 C) (Oral)   Resp 18   Ht 5\' 6"  (1.676 m)   Wt 243 lb (110.2 kg)   SpO2 96%   BMI 39.22 kg/m      Objective:   Physical Exam  General Mental Status- Alert. General Appearance- Not in acute distress.   Skin General: Color- Normal Color. Moisture- Normal Moisture.  Neck Carotid Arteries- Normal color. Moisture- Normal Moisture. No carotid bruits. No JVD.  Chest and Lung Exam Auscultation: Breath Sounds:-Normal.  Cardiovascular Auscultation:Rythm- Regular. Murmurs & Other Heart Sounds:Auscultation of the heart reveals- No Murmurs.  Abdomen Inspection:-Inspeection Normal. Palpation/Percussion:Note:No mass. Palpation and Percussion of the abdomen reveal- Non Tender, Non Distended + BS, no rebound or guarding.    Neurologic Cranial Nerve exam:- CN III-XII intact(No nystagmus), symmetric smile. Strength:- 5/5 equal and symmetric strength both upper and lower extremities.      Assessment & Plan:  For you wellness exam today I have ordered cbc, cmp and  lipid panel,  Flu vaccine declined.  Recommend exercise and healthy diet.  We will let you know lab results as they come in.  Ask you get copy of your mammogram report. Bring by office or scan to my chart.  Please get pap smear updated. Please  get copy of the pap report as well.   Follow up date appointment will be determined after lab review.   Mackie Pai, PA-C

## 2020-06-18 NOTE — Patient Instructions (Addendum)
For you wellness exam today I have ordered cbc, cmp and  lipid panel,  Flu vaccine declined.  Recommend exercise and healthy diet.  We will let you know lab results as they come in.  Ask you get copy of your mammogram report. Bring by office or scan to my chart.  Please get pap smear updated. Please get copy of the pap report as well.   Follow up date appointment will be determined after lab review.    Preventive Care 36-53 Years Old, Female Preventive care refers to lifestyle choices and visits with your health care provider that can promote health and wellness. This includes:  A yearly physical exam. This is also called an annual wellness visit.  Regular dental and eye exams.  Immunizations.  Screening for certain conditions.  Healthy lifestyle choices, such as: ? Eating a healthy diet. ? Getting regular exercise. ? Not using drugs or products that contain nicotine and tobacco. ? Limiting alcohol use. What can I expect for my preventive care visit? Physical exam Your health care provider will check your:  Height and weight. These may be used to calculate your BMI (body mass index). BMI is a measurement that tells if you are at a healthy weight.  Heart rate and blood pressure.  Body temperature.  Skin for abnormal spots. Counseling Your health care provider may ask you questions about your:  Past medical problems.  Family's medical history.  Alcohol, tobacco, and drug use.  Emotional well-being.  Home life and relationship well-being.  Sexual activity.  Diet, exercise, and sleep habits.  Work and work Astronomer.  Access to firearms.  Method of birth control.  Menstrual cycle.  Pregnancy history. What immunizations do I need? Vaccines are usually given at various ages, according to a schedule. Your health care provider will recommend vaccines for you based on your age, medical history, and lifestyle or other factors, such as travel or where you  work.   What tests do I need? Blood tests  Lipid and cholesterol levels. These may be checked every 5 years, or more often if you are over 29 years old.  Hepatitis C test.  Hepatitis B test. Screening  Lung cancer screening. You may have this screening every year starting at age 25 if you have a 30-pack-year history of smoking and currently smoke or have quit within the past 15 years.  Colorectal cancer screening. ? All adults should have this screening starting at age 59 and continuing until age 76. ? Your health care provider may recommend screening at age 17 if you are at increased risk. ? You will have tests every 1-10 years, depending on your results and the type of screening test.  Diabetes screening. ? This is done by checking your blood sugar (glucose) after you have not eaten for a while (fasting). ? You may have this done every 1-3 years.  Mammogram. ? This may be done every 1-2 years. ? Talk with your health care provider about when you should start having regular mammograms. This may depend on whether you have a family history of breast cancer.  BRCA-related cancer screening. This may be done if you have a family history of breast, ovarian, tubal, or peritoneal cancers.  Pelvic exam and Pap test. ? This may be done every 3 years starting at age 35. ? Starting at age 47, this may be done every 5 years if you have a Pap test in combination with an HPV test. Other tests  STD (sexually transmitted  disease) testing, if you are at risk.  Bone density scan. This is done to screen for osteoporosis. You may have this scan if you are at high risk for osteoporosis. Talk with your health care provider about your test results, treatment options, and if necessary, the need for more tests. Follow these instructions at home: Eating and drinking  Eat a diet that includes fresh fruits and vegetables, whole grains, lean protein, and low-fat dairy products.  Take vitamin and mineral  supplements as recommended by your health care provider.  Do not drink alcohol if: ? Your health care provider tells you not to drink. ? You are pregnant, may be pregnant, or are planning to become pregnant.  If you drink alcohol: ? Limit how much you have to 0-1 drink a day. ? Be aware of how much alcohol is in your drink. In the [place] one drink equals one 12 oz bottle of beer (355 mL), one 5 oz glass of wine (148 mL), or one 1 oz glass of hard liquor (44 mL).   Lifestyle  Take daily care of your teeth and gums. Brush your teeth every morning and night with fluoride toothpaste. Floss one time each day.  Stay active. Exercise for at least 30 minutes 5 or more days each week.  Do not use any products that contain nicotine or tobacco, such as cigarettes, e-cigarettes, and chewing tobacco. If you need help quitting, ask your health care provider.  Do not use drugs.  If you are sexually active, practice safe sex. Use a condom or other form of protection to prevent STIs (sexually transmitted infections).  If you do not wish to become pregnant, use a form of birth control. If you plan to become pregnant, see your health care provider for a prepregnancy visit.  If told by your health care provider, take low-dose aspirin daily starting at age 50.  Find healthy ways to cope with stress, such as: ? Meditation, yoga, or listening to music. ? Journaling. ? Talking to a trusted person. ? Spending time with friends and family. Safety  Always wear your seat belt while driving or riding in a vehicle.  Do not drive: ? If you have been drinking alcohol. Do not ride with someone who has been drinking. ? When you are tired or distracted. ? While texting.  Wear a helmet and other protective equipment during sports activities.  If you have firearms in your house, make sure you follow all gun safety procedures. What's next?  Visit your health care provider once a year for an annual wellness  visit.  Ask your health care provider how often you should have your eyes and teeth checked.  Stay up to date on all vaccines. This information is not intended to replace advice given to you by your health care provider. Make sure you discuss any questions you have with your health care provider. Document Revised: 12/30/2019 Document Reviewed: 12/06/2017 Elsevier Patient Education  2021 Elsevier Inc.  

## 2020-06-19 DIAGNOSIS — H1032 Unspecified acute conjunctivitis, left eye: Secondary | ICD-10-CM | POA: Diagnosis not present

## 2020-06-21 NOTE — Addendum Note (Signed)
Addended by: Kelle Darting A on: 06/21/2020 03:19 PM   Modules accepted: Orders

## 2020-06-22 ENCOUNTER — Other Ambulatory Visit: Payer: BC Managed Care – PPO

## 2020-06-29 ENCOUNTER — Other Ambulatory Visit: Payer: Self-pay

## 2020-06-29 ENCOUNTER — Other Ambulatory Visit: Payer: Self-pay | Admitting: Family Medicine

## 2020-06-29 ENCOUNTER — Other Ambulatory Visit (INDEPENDENT_AMBULATORY_CARE_PROVIDER_SITE_OTHER): Payer: BC Managed Care – PPO

## 2020-06-29 DIAGNOSIS — Z1159 Encounter for screening for other viral diseases: Secondary | ICD-10-CM | POA: Diagnosis not present

## 2020-06-29 DIAGNOSIS — Z0000 Encounter for general adult medical examination without abnormal findings: Secondary | ICD-10-CM | POA: Diagnosis not present

## 2020-06-29 DIAGNOSIS — R739 Hyperglycemia, unspecified: Secondary | ICD-10-CM | POA: Diagnosis not present

## 2020-06-29 DIAGNOSIS — E785 Hyperlipidemia, unspecified: Secondary | ICD-10-CM | POA: Diagnosis not present

## 2020-06-30 ENCOUNTER — Encounter: Payer: Self-pay | Admitting: Medical

## 2020-06-30 LAB — COMPREHENSIVE METABOLIC PANEL
ALT: 10 IU/L (ref 0–32)
AST: 14 IU/L (ref 0–40)
Albumin/Globulin Ratio: 1.7 (ref 1.2–2.2)
Albumin: 4.3 g/dL (ref 3.8–4.9)
Alkaline Phosphatase: 105 IU/L (ref 44–121)
BUN/Creatinine Ratio: 21 (ref 9–23)
BUN: 13 mg/dL (ref 6–24)
Bilirubin Total: <0.2 mg/dL (ref 0.0–1.2)
CO2: 22 mmol/L (ref 20–29)
Calcium: 9.7 mg/dL (ref 8.7–10.2)
Chloride: 103 mmol/L (ref 96–106)
Creatinine, Ser: 0.63 mg/dL (ref 0.57–1.00)
Globulin, Total: 2.5 g/dL (ref 1.5–4.5)
Glucose: 108 mg/dL — ABNORMAL HIGH (ref 65–99)
Potassium: 4.7 mmol/L (ref 3.5–5.2)
Sodium: 142 mmol/L (ref 134–144)
Total Protein: 6.8 g/dL (ref 6.0–8.5)
eGFR: 107 mL/min/{1.73_m2} (ref >59)

## 2020-06-30 LAB — LIPID PANEL
Chol/HDL Ratio: 6.5 ratio — ABNORMAL HIGH (ref 0.0–4.4)
Cholesterol, Total: 260 mg/dL — ABNORMAL HIGH (ref 100–199)
HDL: 40 mg/dL (ref >39)
LDL Chol Calc (NIH): 187 mg/dL — ABNORMAL HIGH (ref 0–99)
Triglycerides: 178 mg/dL — ABNORMAL HIGH (ref 0–149)
VLDL Cholesterol Cal: 33 mg/dL (ref 5–40)

## 2020-06-30 LAB — CBC WITH DIFFERENTIAL/PLATELET
Basophils Absolute: 0.1 10*3/uL (ref 0.0–0.2)
Basos: 1 %
EOS (ABSOLUTE): 0.2 10*3/uL (ref 0.0–0.4)
Eos: 2 %
Hematocrit: 42.2 % (ref 34.0–46.6)
Hemoglobin: 13.8 g/dL (ref 11.1–15.9)
Immature Grans (Abs): 0 10*3/uL (ref 0.0–0.1)
Immature Granulocytes: 0 %
Lymphocytes Absolute: 3.7 10*3/uL — ABNORMAL HIGH (ref 0.7–3.1)
Lymphs: 37 %
MCH: 29.2 pg (ref 26.6–33.0)
MCHC: 32.7 g/dL (ref 31.5–35.7)
MCV: 89 fL (ref 79–97)
Monocytes Absolute: 0.6 10*3/uL (ref 0.1–0.9)
Monocytes: 6 %
Neutrophils Absolute: 5.3 10*3/uL (ref 1.4–7.0)
Neutrophils: 54 %
Platelets: 412 10*3/uL (ref 150–450)
RBC: 4.72 x10E6/uL (ref 3.77–5.28)
RDW: 14.4 % (ref 11.7–15.4)
WBC: 10 10*3/uL (ref 3.4–10.8)

## 2020-06-30 LAB — HEPATITIS C ANTIBODY: Hep C Virus Ab: <0.1 s/co ratio (ref 0.0–0.9)

## 2020-06-30 LAB — HEMOGLOBIN A1C
Est. average glucose Bld gHb Est-mCnc: 123 mg/dL
Hgb A1c MFr Bld: 5.9 % — ABNORMAL HIGH (ref 4.8–5.6)

## 2020-07-01 ENCOUNTER — Telehealth: Payer: Self-pay | Admitting: Medical

## 2020-07-01 DIAGNOSIS — E7849 Other hyperlipidemia: Secondary | ICD-10-CM

## 2020-07-01 MED ORDER — ROSUVASTATIN CALCIUM 10 MG PO TABS: 10.0000 mg | ORAL_TABLET | Freq: Every day | ORAL | 3 refills | Status: DC

## 2020-07-01 NOTE — Telephone Encounter (Signed)
Opened to review and send in prescription.

## 2020-07-01 NOTE — Telephone Encounter (Signed)
Rx crestor sent to pharmacy.

## 2020-07-05 DIAGNOSIS — J3089 Other allergic rhinitis: Secondary | ICD-10-CM | POA: Diagnosis not present

## 2020-07-05 DIAGNOSIS — Z20822 Contact with and (suspected) exposure to covid-19: Secondary | ICD-10-CM | POA: Diagnosis not present

## 2020-07-25 DIAGNOSIS — J040 Acute laryngitis: Secondary | ICD-10-CM | POA: Diagnosis not present

## 2020-07-25 DIAGNOSIS — J039 Acute tonsillitis, unspecified: Secondary | ICD-10-CM | POA: Diagnosis not present

## 2020-07-25 DIAGNOSIS — J029 Acute pharyngitis, unspecified: Secondary | ICD-10-CM | POA: Diagnosis not present

## 2020-08-24 ENCOUNTER — Other Ambulatory Visit: Payer: Self-pay

## 2020-08-24 ENCOUNTER — Other Ambulatory Visit (HOSPITAL_COMMUNITY)
Admission: RE | Admit: 2020-08-24 | Discharge: 2020-08-24 | Disposition: A | Discharge status: Home / Self Care | Payer: BC Managed Care – PPO | Source: Ambulatory Visit | Attending: Nurse Practitioner | Admitting: Nurse Practitioner

## 2020-08-24 ENCOUNTER — Encounter: Payer: Self-pay | Admitting: Nurse Practitioner

## 2020-08-24 ENCOUNTER — Ambulatory Visit (INDEPENDENT_AMBULATORY_CARE_PROVIDER_SITE_OTHER): Payer: BC Managed Care – PPO | Admitting: Nurse Practitioner

## 2020-08-24 ENCOUNTER — Telehealth: Payer: Self-pay | Admitting: *Deleted

## 2020-08-24 ENCOUNTER — Encounter: Payer: Self-pay | Admitting: *Deleted

## 2020-08-24 VITALS — BP 124/76 | Ht 67.0 in | Wt 232.0 lb

## 2020-08-24 DIAGNOSIS — Z01419 Encounter for gynecological examination (general) (routine) without abnormal findings: Secondary | ICD-10-CM | POA: Diagnosis not present

## 2020-08-24 DIAGNOSIS — Z780 Asymptomatic menopausal state: Secondary | ICD-10-CM

## 2020-08-24 NOTE — Telephone Encounter (Signed)
Patient called was seen today for visit, reports she left earlier from work to attend this visit. Patient asked if a doctor note could be provided. Letter sent via my chart, patient aware.

## 2020-08-24 NOTE — Progress Notes (Signed)
   Jasmin Stewart Aug 19, 1965 [id number]   History:  55 y.o. [id number] presents for annual exam. Postmenopausal - no HRT, no bleeding. Normal pap and mammogram history. Smoker - 1/4 ppd. Down 15 pounds since March with diet and exercise. Her work is now providing services for smoking cessation, weight loss, and preventative lab work.   Gynecologic History No LMP recorded. Patient is perimenopausal.   Contraception/Family planning: post menopausal status  Health Maintenance Last Pap: 07/28/2016. Results were: normal Last mammogram: 12/2019. Results were: normal Last colonoscopy: 06/07/2018. Results were: pre-cancerous polyps, 3-year recall Last Dexa: Not indicated  Past medical history, past surgical history, family history and social history were all reviewed and documented in the EPIC chart. Long-term boyfriend. 4 children - oldest son is 90, daughters age 73-31. Work for [employer].   ROS:  A ROS was performed and pertinent positives and negatives are included.  Exam:  Vitals:   08/24/20 1509  BP: 124/76  Weight: 232 lb (105.2 kg)  Height: 5\' 7"  (1.702 m)   Body mass index is 36.34 kg/m.  General appearance:  Normal Thyroid:  Symmetrical, normal in size, without palpable masses or nodularity. Respiratory  Auscultation:  Clear without wheezing or rhonchi Cardiovascular  Auscultation:  Regular rate, without rubs, murmurs or gallops  Edema/varicosities:  Not grossly evident Abdominal  Soft,nontender, without masses, guarding or rebound.  Liver/spleen:  No organomegaly noted  Hernia:  None appreciated  Skin  Inspection:  Grossly normal Breasts: Examined lying and sitting.   Right: Without masses, retractions, nipple discharge or axillary adenopathy.   Left: Without masses, retractions, nipple discharge or axillary adenopathy. Genitourinary   Inguinal/mons:  Normal without inguinal adenopathy  External genitalia:  Normal appearing vulva with no masses, tenderness, or  lesions  BUS/Urethra/Skene's glands:  Normal  Vagina:  Normal appearing with normal color and discharge, no lesions  Cervix:  Normal appearing without discharge or lesions  Uterus:  Normal in size, shape and contour.  Midline and mobile, nontender  Adnexa/parametria:     Rt: Normal in size, without masses or tenderness.   Lt: Normal in size, without masses or tenderness.  Anus and perineum: Normal  Digital rectal exam: Normal sphincter tone without palpated masses or tenderness  Assessment/Plan:  55 y.o. [id number] for annual exam.   Well female exam with routine gynecological exam - Plan: Cytology - PAP( [place]). Education provided on SBEs, importance of preventative screenings, current guidelines, high calcium diet, regular exercise, and multivitamin daily. Congratulated on weight loss and encouraged to use smoking cessation program. Labs with work.   Postmenopausal - no HRT, no bleeding.   Screening for cervical cancer - Normal Pap history.  Pap today.  Screening for breast cancer - Normal mammogram history.  Continue annual screenings.  Normal breast exam today.  Screening for colon cancer - Colonoscopy 05/2020. Will repeat at GI's recommended interval.   Return in 1 year for annual.    Tamela Gammon DNP, 3:28 PM 08/24/2020

## 2020-08-24 NOTE — Patient Instructions (Signed)

## 2020-08-26 LAB — CYTOLOGY - PAP
Adequacy: ABSENT
Comment: NEGATIVE
Diagnosis: NEGATIVE
High risk HPV: NEGATIVE

## 2020-08-27 ENCOUNTER — Encounter: Payer: Self-pay | Admitting: Nurse Practitioner

## 2020-08-27 NOTE — Progress Notes (Signed)
Patient scheduled for May 26. °

## 2020-09-02 ENCOUNTER — Ambulatory Visit: Payer: BC Managed Care – PPO | Admitting: Nurse Practitioner

## 2020-09-02 ENCOUNTER — Other Ambulatory Visit: Payer: Self-pay

## 2020-09-02 ENCOUNTER — Encounter: Payer: Self-pay | Admitting: Nurse Practitioner

## 2020-09-02 VITALS — BP 122/76

## 2020-09-02 DIAGNOSIS — Z113 Encounter for screening for infections with a predominantly sexual mode of transmission: Secondary | ICD-10-CM

## 2020-09-02 NOTE — Progress Notes (Signed)
   Acute Office Visit  Subjective:    Patient ID: LARENDA REEDY, female    DOB: 25-Feb-1966, 55 y.o.   MRN: [id number]   HPI 55 y.o. presents today for STD screening. Pap 08/24/20 showed presence of trichomonas. She is here today for confirmation testing as recommended. Denies any symptoms. Long-term partner.    Review of Systems  Constitutional: Negative.   Genitourinary: Negative.        Objective:    Physical Exam Constitutional:      Appearance: Normal appearance.  Genitourinary:    General: Normal vulva.     Vagina: Normal.     Cervix: Normal.     BP 122/76  Wt Readings from Last 3 Encounters:  08/24/20 232 lb (105.2 kg)  06/18/20 243 lb (110.2 kg)  11/13/19 226 lb (102.5 kg)        Assessment & Plan:   Problem List Items Addressed This Visit   None   Visit Diagnoses    Screen for STD (sexually transmitted disease)    -  Primary   Relevant Orders   SURESWAB CT/NG/T. vaginalis     Plan: STD panel pending, will triage based on results.      Tamela Gammon DNP, 2:04 PM 09/02/2020

## 2020-09-03 ENCOUNTER — Other Ambulatory Visit: Payer: Self-pay | Admitting: Nurse Practitioner

## 2020-09-03 DIAGNOSIS — A599 Trichomoniasis, unspecified: Secondary | ICD-10-CM

## 2020-09-03 LAB — SURESWAB CT/NG/T. VAGINALIS
C. trachomatis RNA, TMA: NOT DETECTED
N. gonorrhoeae RNA, TMA: NOT DETECTED
Trichomonas vaginalis RNA: DETECTED — AB

## 2020-09-03 MED ORDER — METRONIDAZOLE 500 MG PO TABS: 500.0000 mg | ORAL_TABLET | Freq: Two times a day (BID) | ORAL | 0 refills | Status: AC

## 2020-09-06 ENCOUNTER — Encounter: Payer: Self-pay | Admitting: Nurse Practitioner

## 2020-09-15 DIAGNOSIS — H5203 Hypermetropia, bilateral: Secondary | ICD-10-CM | POA: Diagnosis not present

## 2020-11-03 ENCOUNTER — Telehealth: Payer: Self-pay | Admitting: Family Medicine

## 2020-11-03 MED ORDER — NITROGLYCERIN 0.2 MG/HR TD PT24: MEDICATED_PATCH | TRANSDERMAL | 0 refills | Status: DC

## 2020-11-03 NOTE — Telephone Encounter (Signed)
LOV 02/27/2019  ----Pt called request refill on Nitro patch for chronic L shoulder pain .   --Pt uses :   [facility] [id number] - [place], [place] - [address] AT [place]  [address], [place] [address]  Phone:  [phone]  Fax:  [phone]   ----Forwarding request to med asst for review w/ provider.  --glh

## 2020-11-03 NOTE — Telephone Encounter (Signed)
Refill sent. See meds.  

## 2020-12-05 ENCOUNTER — Encounter: Payer: Self-pay | Admitting: Nurse Practitioner

## 2020-12-09 ENCOUNTER — Ambulatory Visit: Payer: BC Managed Care – PPO | Admitting: Nurse Practitioner

## 2020-12-31 ENCOUNTER — Other Ambulatory Visit: Payer: Self-pay | Admitting: Medical

## 2020-12-31 DIAGNOSIS — E7849 Other hyperlipidemia: Secondary | ICD-10-CM

## 2021-01-03 ENCOUNTER — Ambulatory Visit: Payer: BC Managed Care – PPO | Admitting: Family Medicine

## 2021-01-03 ENCOUNTER — Encounter: Payer: Self-pay | Admitting: Medical

## 2021-01-03 VITALS — Ht 66.5 in | Wt 231.6 lb

## 2021-01-03 DIAGNOSIS — M5412 Radiculopathy, cervical region: Secondary | ICD-10-CM

## 2021-01-03 DIAGNOSIS — E7849 Other hyperlipidemia: Secondary | ICD-10-CM

## 2021-01-03 MED ORDER — ROSUVASTATIN CALCIUM 10 MG PO TABS: 10.0000 mg | ORAL_TABLET | Freq: Every day | ORAL | 3 refills | Status: DC

## 2021-01-03 MED ORDER — GABAPENTIN 100 MG PO CAPS: 100.0000 mg | ORAL_CAPSULE | Freq: Three times a day (TID) | ORAL | 3 refills | Status: DC

## 2021-01-03 NOTE — Patient Instructions (Signed)
Good to see you Please try heat  Please try the exercises  Please try the gabapentin before bed. Then you can increase to 2 or 3 times daily as you tolerate.   Please send me a message in MyChart with any questions or updates.  Please see me back in 4 weeks.   --Dr. Raeford Razor

## 2021-01-03 NOTE — Progress Notes (Signed)
Jasmin Stewart - 55 y.o. female MRN [id number]  Date of birth: [birth year]  SUBJECTIVE:  Including CC & ROS.  No chief complaint on file.   Jasmin Stewart is a 55 y.o. female that is presenting with left arm pain.  She also had intermittent right arm pain.  Pain is intermittent in nature and has gotten worse with weeks.  No injury or inciting event.   Review of Systems See HPI   HISTORY: Past Medical, Surgical, Social, and Family History Reviewed & Updated per EMR.   Pertinent Historical Findings include:  Past Medical History:  Diagnosis Date   Abdominal hernia    Anemia    Asthma    Fibroid, uterine    never had surgery   History of blood transfusion 1991; 1995   related to childbirth   History of chicken pox    Age 38   Hypercholesteremia    Hypoglycemia    Hypokalemia    Hypotension    Pneumonia 2013,2014, 2018   PVC (premature ventricular contraction)    Ulcer of esophagus     Past Surgical History:  Procedure Laterality Date   ABDOMINAL HERNIA REPAIR  2007; 06/05/2017   Suprapubic, Surgery 2007 with mesh VHR w/mesh; OPEN VENTRAL HERNIA REPAIR WITH [place]; 1995; 1998   ESOPHAGOGASTRODUODENOSCOPY  01/2017   HERNIA REPAIR  2007; 06/05/2017   INSERTION OF MESH N/A 06/05/2017   Procedure: INSERTION OF MESH;  Surgeon: Donnie Mesa, MD;  Location: [facility];  Service: General;  Laterality: N/A;   VENTRAL HERNIA REPAIR N/A 06/05/2017   Procedure: OPEN VENTRAL [place];  Surgeon: Donnie Mesa, MD;  Location: [place];  Service: General;  Laterality: N/A;    Family History  Problem Relation Age of Onset   Thyroid disease Mother    Atrial fibrillation Mother    Hyperlipidemia Mother    Hyperlipidemia Sister    Breast cancer Sister    Hyperlipidemia Brother    Thyroid disease Maternal Grandmother    Glaucoma Maternal Grandmother    Cataracts Maternal Grandmother    Esophageal cancer Maternal Grandfather    Hypertension Maternal Uncle     Obesity Maternal Uncle    Allergies Son        Hay Fever   Healthy Daughter    Colon cancer Neg Hx    Colon polyps Neg Hx    Stomach cancer Neg Hx    Rectal cancer Neg Hx     Social History   Socioeconomic History   Marital status: Single    Spouse name: Not on file   Number of children: Not on file   Years of education: Not on file   Highest education level: Not on file  Occupational History   Not on file  Tobacco Use   Smoking status: Every Day    Packs/day: 0.25    Years: 36.00    Pack years: 9.00    Types: Cigarettes   Smokeless tobacco: Never  Vaping Use   Vaping Use: Former  Substance and Sexual Activity   Alcohol use: Not Currently    Alcohol/week: 0.0 standard drinks   Drug use: No   Sexual activity: Yes    Birth control/protection: Post-menopausal    Comment: 1st intercourse 55 yo-More than 5 partners  Other Topics Concern   Not on file  Social History Narrative   Not on file   Social Determinants of Health   Financial Resource Strain: Not on  file  Food Insecurity: Not on file  Transportation Needs: Not on file  Physical Activity: Not on file  Stress: Not on file  Social Connections: Not on file  Intimate Partner Violence: Not on file     PHYSICAL EXAM:  VS: Ht 5' 6.5" (1.689 m)   Wt 231 lb 9.6 oz (105.1 kg)   BMI 36.82 kg/m  Physical Exam Gen: NAD, alert, cooperative with exam, well-appearing      ASSESSMENT & PLAN:   Cervical radiculopathy Symptoms more consistent with a radicular pain as well as a spasm in the posterior neck.  Less likely for calcific tendinitis.  -Counseled on home exercise therapy and supportive care. -Gabapentin. -Could consider further imaging and/or physical therapy.

## 2021-01-03 NOTE — Assessment & Plan Note (Signed)
Symptoms more consistent with a radicular pain as well as a spasm in the posterior neck.  Less likely for calcific tendinitis.  -Counseled on home exercise therapy and supportive care. -Gabapentin. -Could consider further imaging and/or physical therapy.

## 2021-01-07 ENCOUNTER — Telehealth: Payer: Self-pay | Admitting: Family Medicine

## 2021-01-07 ENCOUNTER — Encounter: Payer: Self-pay | Admitting: Family Medicine

## 2021-01-07 ENCOUNTER — Other Ambulatory Visit: Payer: Self-pay | Admitting: Family Medicine

## 2021-01-07 DIAGNOSIS — M5412 Radiculopathy, cervical region: Secondary | ICD-10-CM

## 2021-01-07 MED ORDER — HYDROCODONE-ACETAMINOPHEN 5-325 MG PO TABS: 1.0000 | ORAL_TABLET | Freq: Three times a day (TID) | ORAL | 0 refills | Status: DC | PRN

## 2021-01-07 NOTE — Telephone Encounter (Signed)
Patient cld states Rt arm pain  & that she has  been sending Mychart msg , advised pt we are in Clinic & patient in the office & that provider or medical staff will contact her when the get a free moment.   Pt very agitated (unsure to advise her if she is in that much pain maybe she should go to our Emergency Room or Urgent Care for immediate medical attention (Please advise)  --forwarding message to med asst for review with provider.  --glh

## 2021-01-07 NOTE — Telephone Encounter (Signed)
See 01/07/21 Mychart patient message. Patient was advised of MD's plan via MyChart. See meds and imaging xray orders.

## 2021-01-07 NOTE — Progress Notes (Signed)
We will proceed with cervical x-rays and provided Norco for severe pain  Rosemarie Ax, MD [facility] 01/07/2021, 11:48 AM

## 2021-01-08 ENCOUNTER — Emergency Department (HOSPITAL_BASED_OUTPATIENT_CLINIC_OR_DEPARTMENT_OTHER)
Admission: EM | Admit: 2021-01-08 | Discharge: 2021-01-08 | Disposition: A | Discharge status: Home / Self Care | Payer: BC Managed Care – PPO | Attending: Emergency Medicine | Admitting: Emergency Medicine

## 2021-01-08 ENCOUNTER — Emergency Department (HOSPITAL_BASED_OUTPATIENT_CLINIC_OR_DEPARTMENT_OTHER): Payer: BC Managed Care – PPO

## 2021-01-08 ENCOUNTER — Encounter (HOSPITAL_BASED_OUTPATIENT_CLINIC_OR_DEPARTMENT_OTHER): Payer: Self-pay

## 2021-01-08 ENCOUNTER — Encounter: Payer: Self-pay | Admitting: Family Medicine

## 2021-01-08 ENCOUNTER — Other Ambulatory Visit: Payer: Self-pay

## 2021-01-08 DIAGNOSIS — F17210 Nicotine dependence, cigarettes, uncomplicated: Secondary | ICD-10-CM | POA: Diagnosis not present

## 2021-01-08 DIAGNOSIS — M25511 Pain in right shoulder: Secondary | ICD-10-CM | POA: Diagnosis not present

## 2021-01-08 DIAGNOSIS — M7531 Calcific tendinitis of right shoulder: Secondary | ICD-10-CM | POA: Diagnosis not present

## 2021-01-08 DIAGNOSIS — J45909 Unspecified asthma, uncomplicated: Secondary | ICD-10-CM | POA: Diagnosis not present

## 2021-01-08 DIAGNOSIS — M7551 Bursitis of right shoulder: Secondary | ICD-10-CM | POA: Diagnosis not present

## 2021-01-08 MED ORDER — DICLOFENAC SODIUM 1 % EX GEL: 4.0000 g | Freq: Four times a day (QID) | CUTANEOUS | 0 refills | Status: AC

## 2021-01-08 MED ORDER — MORPHINE SULFATE (PF) 4 MG/ML IV SOLN
8.0000 mg | Freq: Once | INTRAVENOUS | Status: DC
  Filled 2021-01-08: qty 2

## 2021-01-08 MED ORDER — KETOROLAC TROMETHAMINE 60 MG/2ML IM SOLN
30.0000 mg | Freq: Once | INTRAMUSCULAR | Status: AC
  Administered 2021-01-08: 30 mg via INTRAMUSCULAR
  Filled 2021-01-08: qty 2

## 2021-01-08 MED ORDER — METHOCARBAMOL 500 MG PO TABS
500.0000 mg | ORAL_TABLET | Freq: Once | ORAL | Status: AC
  Administered 2021-01-08: 500 mg via ORAL
  Filled 2021-01-08: qty 1

## 2021-01-08 MED ORDER — LIDOCAINE 5 % EX PTCH: 1.0000 | MEDICATED_PATCH | CUTANEOUS | 0 refills | Status: AC

## 2021-01-08 NOTE — ED Triage Notes (Signed)
Shoulder pain  Saw Dr. Raeford Razor on 01/03/21  Rx for Gabapentin  F/u via messaging on my chart due to increased pain Rx for Norco & plan for xray on Monday Pt. Continues to get more Severe with any movement of RUE.

## 2021-01-08 NOTE — ED Notes (Signed)
Pt. Declines MSO4. Drove here and does not have alternate transportation.

## 2021-01-08 NOTE — ED Notes (Signed)
Attempted to obtain vitals on Pt, Pt wasn't in the room at the time. Will obtain vitals when Pt returns.

## 2021-01-08 NOTE — ED Notes (Signed)
Pt. Provided heat pack for shoulder.

## 2021-01-08 NOTE — ED Provider Notes (Signed)
Care was taken over from Dr. Doren Custard.  Patient has a 3-day history of worsening pain to her right shoulder.  She has limited range of motion the shoulder due to pain.  She has no current associated neck pain.  No neurologic deficits.  Pulses are intact.  There is no swelling, warmth or erythema that would be more concerning for septic joint.  She was awaiting MRI.  MRI shows evidence of moderate bursitis around the [facility] joint.  There is also some mild calcific tendinitis.  Her pain seems to be mainly located around the [facility] joint which would be consistent with MRI findings of bursitis.  She recently was given a prescription yesterday for Norco.  She also was recently started on gabapentin.  She states that she cannot really use ibuprofen much due to peptic ulcer disease.  Will start Lidoderm patches and Voltaren gel and she will follow-up on Monday or Tuesday with Dr. Raeford Razor and potentially may need an injection pending that evaluation.  Return precautions were given.   Malvin Johns, MD 01/08/21 1630

## 2021-01-08 NOTE — ED Notes (Signed)
Patient transported to MRI 

## 2021-01-08 NOTE — ED Provider Notes (Signed)
[facility] Provider Note   CSN: [id number] Arrival date & time: 01/08/21  0859     History Chief Complaint  Patient presents with   Shoulder Pain    Jasmin Stewart is a 55 y.o. female.   Shoulder Pain Associated symptoms: no back pain, no fatigue, no fever and no neck pain   Patient presents for left shoulder pain.  She is followed by sports medicine for previous areas of pain.  She has been diagnosed with calcific tendinitis of the left shoulder.  It is suspected that she has cervical radiculopathy and she has been taking gabapentin for this.  She reports that she has experienced new right shoulder pain over the past 4 days.  This is different pain than she has been experiencing with her known chronic conditions.  Pain is located throughout the right shoulder.  When it began 4 days ago, it was mild.  The following day, pain became more severe.  She has been unable to sleep for the past 2 days due to the severity of the pain.  Pain is worsened with any movement of the right shoulder.  Patient has had to keep her arm in a fixed position.  She is concerned that this will interfere with work.  She is right-handed.  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  Patient called her sports medicine doctor over the weekend who prescribed her some Norco.  She took Norco early this morning, in addition to Aleve and Tylenol.  She experienced minimal relief from the pain with these medications.  Patient denies any recent injuries to the area of her right shoulder.  She does state that she received a shingles vaccine in the right shoulder approximately 1 week ago.  She states that, before the onset of this pain, she had full range of motion of her right shoulder and was even recently kayaking.  She denies any systemic symptoms.    Past Medical History:  Diagnosis Date   Abdominal hernia    Anemia    Asthma    Fibroid, uterine    never had surgery   History of blood transfusion 1991;  1995   related to childbirth   History of chicken pox    Age 38   Hypercholesteremia    Hypoglycemia    Hypokalemia    Hypotension    Pneumonia 2013,2014, 2018   PVC (premature ventricular contraction)    Ulcer of esophagus     Patient Active Problem List   Diagnosis Date Noted   Cervical radiculopathy 01/03/2021   Enteritis 10/24/2019   Calcific tendinitis of left shoulder 12/02/2018   Hyperlipidemia 04/19/2018   Ventral incisional hernia 06/05/2017   PVC's (premature ventricular contractions) 06/09/2016    Past Surgical History:  Procedure Laterality Date   ABDOMINAL HERNIA REPAIR  2007; 06/05/2017   Suprapubic, Surgery 2007 with mesh VHR w/mesh; [place]; 1995; 1998   ESOPHAGOGASTRODUODENOSCOPY  01/2017   HERNIA REPAIR  2007; 06/05/2017   INSERTION OF MESH N/A 06/05/2017   Procedure: INSERTION OF MESH;  Surgeon: Donnie Mesa, MD;  Location: [facility];  Service: General;  Laterality: N/A;   VENTRAL HERNIA REPAIR N/A 06/05/2017   Procedure: [place];  Surgeon: Donnie Mesa, MD;  Location: [facility];  Service: General;  Laterality: N/A;     OB History     Gravida  7   Para  4  Term      Preterm      AB  3   Living  4      SAB      IAB      Ectopic      Multiple      Live Births              Family History  Problem Relation Age of Onset   Thyroid disease Mother    Atrial fibrillation Mother    Hyperlipidemia Mother    Hyperlipidemia Sister    Breast cancer Sister    Hyperlipidemia Brother    Thyroid disease Maternal Grandmother    Glaucoma Maternal Grandmother    Cataracts Maternal Grandmother    Esophageal cancer Maternal Grandfather    Hypertension Maternal Uncle    Obesity Maternal Uncle    Allergies Son        Hay Fever   Healthy Daughter    Colon cancer Neg Hx    Colon polyps Neg Hx    Stomach cancer Neg Hx    Rectal cancer Neg Hx     Social History    Tobacco Use   Smoking status: Every Day    Packs/day: 0.25    Years: 36.00    Pack years: 9.00    Types: Cigarettes   Smokeless tobacco: Never  Vaping Use   Vaping Use: Former  Substance Use Topics   Alcohol use: Not Currently    Alcohol/week: 0.0 standard drinks   Drug use: No    Home Medications Prior to Admission medications   Medication Sig Start Date End Date Taking? Authorizing Provider  diclofenac Sodium (VOLTAREN) 1 % GEL Apply 4 g topically 4 (four) times daily. 01/08/21  Yes Malvin Johns, MD  lidocaine (LIDODERM) 5 % Place 1 patch onto the skin daily. Remove & Discard patch within 12 hours or as directed by MD 01/08/21  Yes Malvin Johns, MD  esomeprazole (NEXIUM) 20 MG capsule Take 20 mg by mouth daily as needed.     [provider]  gabapentin (NEURONTIN) 100 MG capsule Take 1 capsule (100 mg total) by mouth 3 (three) times daily. 01/03/21   Rosemarie Ax, MD  HYDROcodone-acetaminophen (NORCO/VICODIN) 5-325 MG tablet Take 1 tablet by mouth every 8 (eight) hours as needed. 01/07/21   Rosemarie Ax, MD  nitroGLYCERIN (NITRODUR - DOSED IN MG/24 HR) 0.2 mg/hr patch Cut and apply 1/4 patch to most painful area q24h. 11/03/20   Rosemarie Ax, MD  rosuvastatin (CRESTOR) 10 MG tablet Take 1 tablet (10 mg total) by mouth daily. 01/03/21   Saguier, Percell Miller, PA-C    Allergies    Patient has no known allergies.  Review of Systems   Review of Systems  Constitutional:  Negative for activity change, appetite change, chills, fatigue and fever.  HENT:  Negative for ear pain and sore throat.   Eyes:  Negative for pain and visual disturbance.  Respiratory:  Negative for cough, chest tightness and shortness of breath.   Cardiovascular:  Negative for chest pain and palpitations.  Gastrointestinal:  Negative for abdominal pain, nausea and vomiting.  Genitourinary:  Negative for dysuria and hematuria.  Musculoskeletal:  Positive for arthralgias. Negative for back  pain, gait problem, joint swelling, myalgias and neck pain.  Skin:  Negative for color change and rash.  Neurological:  Negative for dizziness, seizures, syncope, weakness, light-headedness, numbness and headaches.  Hematological:  Does not bruise/bleed easily.  All other systems reviewed and are  negative.  Physical Exam Updated Vital Signs BP (!) 122/91 (BP Location: Left Arm)   Pulse 70   Temp 98.4 F (36.9 C) (Oral)   Resp 16   Ht 5' 6.5" (1.689 m)   Wt 104.3 kg   SpO2 98%   BMI 36.57 kg/m   Physical Exam Vitals and nursing note reviewed.  Constitutional:      General: She is not in acute distress.    Appearance: Normal appearance. She is well-developed and normal weight. She is not ill-appearing, toxic-appearing or diaphoretic.  HENT:     Head: Normocephalic and atraumatic.     Right Ear: External ear normal.     Left Ear: External ear normal.     Nose: Nose normal.  Eyes:     Extraocular Movements: Extraocular movements intact.     Conjunctiva/sclera: Conjunctivae normal.  Cardiovascular:     Rate and Rhythm: Normal rate and regular rhythm.     Heart sounds: No murmur heard. Pulmonary:     Effort: Pulmonary effort is normal. No respiratory distress.     Breath sounds: Normal breath sounds.  Abdominal:     Palpations: Abdomen is soft.     Tenderness: There is no abdominal tenderness.  Musculoskeletal:        General: Tenderness present. No swelling or deformity.     Cervical back: Normal range of motion and neck supple.     Right lower leg: No edema.     Left lower leg: No edema.  Skin:    General: Skin is warm and dry.     Capillary Refill: Capillary refill takes less than 2 seconds.     Coloration: Skin is not jaundiced or pale.  Neurological:     General: No focal deficit present.     Mental Status: She is alert and oriented to person, place, and time.     Sensory: No sensory deficit.     Motor: No weakness.  Psychiatric:        Mood and Affect: Mood is  anxious. Affect is angry and tearful.        Speech: Speech normal. Speech is not slurred or tangential.        Behavior: Behavior is agitated.    ED Results / Procedures / Treatments   Labs (all labs ordered are listed, but only abnormal results are displayed) Labs Reviewed - No data to display  EKG None  Radiology DG Shoulder Right  Result Date: 01/08/2021 CLINICAL DATA:  Lateral shoulder pain, atraumatic EXAM: RIGHT SHOULDER - 2 VIEW COMPARISON:  03/01/2018 FINDINGS: Homogeneous calcification at the rotator cuff which increased from prior. No fracture or subluxation. No significant spurring. IMPRESSION: Calcific tendinitis of the rotator cuff. Electronically Signed   By: Jorje Guild M.D.   On: 01/08/2021 10:43   MR SHOULDER RIGHT WO CONTRAST  Result Date: 01/08/2021 CLINICAL DATA:  Severe right shoulder pain for the past 3 days. No injury or prior surgery. EXAM: MRI OF THE RIGHT SHOULDER WITHOUT CONTRAST TECHNIQUE: Multiplanar, multisequence MR imaging of the shoulder was performed. No intravenous contrast was administered. COMPARISON:  Right shoulder x-rays from same day. FINDINGS: Rotator cuff: Intact rotator cuff. Mild supraspinatus and infraspinatus tendinosis with 5 mm calcifications in the distal tendons (series 10, images 3 and 7). Muscles: No atrophy or abnormal signal of the muscles of the rotator cuff. Biceps long head:  Intact and normally positioned. Acromioclavicular Joint: Moderate arthropathy of the acromioclavicular joint. Type I acromion. Moderate amount of fluid  in the subacromial/subdeltoid bursa with mild synovitis. Glenohumeral Joint: No joint effusion. No chondral defect. Labrum: Grossly intact, but evaluation is limited by lack of intraarticular fluid. Bones:  No marrow abnormality, fracture or dislocation. Other: None. IMPRESSION: 1. Mild supraspinatus and infraspinatus tendinosis with calcific tendinitis. No rotator cuff tendon tear. 2. Moderate  subacromial/subdeltoid bursitis. 3. Moderate acromioclavicular osteoarthritis. Electronically Signed   By: Titus Dubin M.D.   On: 01/08/2021 16:09    Procedures Procedures   Medications Ordered in ED Medications  ketorolac (TORADOL) injection 30 mg (30 mg Intramuscular Given 01/08/21 1211)  methocarbamol (ROBAXIN) tablet 500 mg (500 mg Oral Given 01/08/21 1221)    ED Course  I have reviewed the triage vital signs and the nursing notes.  Pertinent labs & imaging results that were available during my care of the patient were reviewed by me and considered in my medical decision making (see chart for details).    MDM Rules/Calculators/A&P                          Patient presents for 4 days of worsening right shoulder pain.  She denies any recent injuries.  Pain is located throughout her right shoulder.  On exam, she has tenderness throughout the lateral aspect of her right shoulder.  Pain is worsened with active and passive range of motion.  Due to the pain, patient is distressed and anxious.  At times, she is tearful describing her anxiety about having to go to work with this shoulder pain.  Last doses of pain medication were approximately 4 AM this morning.  Toradol and Robaxin were given in the ED.  X-ray imaging showed calcific tendinitis with no other acute findings.  I have low suspicion for septic joint.  Patient's presentation is consistent with frozen shoulder, however, the onset over the past 4 days would be peculiar.  MRI was ordered to identify possible frozen shoulder or other etiologies of her acute severe pain and disability.  At time of signout, results of MRI imaging was pending.  Care of patient was signed out to oncoming ED provider.  Final Clinical Impression(s) / ED Diagnoses Final diagnoses:  Acute bursitis of right shoulder    Rx / DC Orders ED Discharge Orders          Ordered    lidocaine (LIDODERM) 5 %  Every 24 hours        01/08/21 1627    diclofenac Sodium  (VOLTAREN) 1 % GEL  4 times daily        01/08/21 1627             Godfrey Pick, MD 01/09/21 0013

## 2021-01-11 ENCOUNTER — Ambulatory Visit (INDEPENDENT_AMBULATORY_CARE_PROVIDER_SITE_OTHER): Payer: BC Managed Care – PPO | Admitting: Medical

## 2021-01-11 ENCOUNTER — Encounter: Payer: Self-pay | Admitting: Family Medicine

## 2021-01-11 ENCOUNTER — Other Ambulatory Visit: Payer: Self-pay

## 2021-01-11 ENCOUNTER — Ambulatory Visit (INDEPENDENT_AMBULATORY_CARE_PROVIDER_SITE_OTHER): Payer: BC Managed Care – PPO | Admitting: Family Medicine

## 2021-01-11 ENCOUNTER — Ambulatory Visit: Payer: Self-pay

## 2021-01-11 VITALS — BP 118/61 | HR 78 | Resp 18 | Ht 66.5 in | Wt 232.0 lb

## 2021-01-11 VITALS — Ht 66.5 in | Wt 230.0 lb

## 2021-01-11 DIAGNOSIS — M7551 Bursitis of right shoulder: Secondary | ICD-10-CM

## 2021-01-11 DIAGNOSIS — R739 Hyperglycemia, unspecified: Secondary | ICD-10-CM

## 2021-01-11 DIAGNOSIS — Z6837 Body mass index (BMI) 37.0-37.9, adult: Secondary | ICD-10-CM

## 2021-01-11 DIAGNOSIS — E7849 Other hyperlipidemia: Secondary | ICD-10-CM

## 2021-01-11 DIAGNOSIS — M7532 Calcific tendinitis of left shoulder: Secondary | ICD-10-CM

## 2021-01-11 DIAGNOSIS — E559 Vitamin D deficiency, unspecified: Secondary | ICD-10-CM | POA: Diagnosis not present

## 2021-01-11 MED ORDER — METHYLPREDNISOLONE ACETATE 40 MG/ML IJ SUSP
40.0000 mg | Freq: Once | INTRAMUSCULAR | Status: AC
  Administered 2021-01-11: 40 mg via INTRA_ARTICULAR

## 2021-01-11 NOTE — Patient Instructions (Signed)
Good to see you Please try ice  Please get the neck xrays and I'll call with the results.   Please send me a message in MyChart with any questions or updates.  Please see me back in 2 weeks.   --Dr. Raeford Razor

## 2021-01-11 NOTE — Progress Notes (Signed)
Jasmin Stewart - 55 y.o. female MRN [id number]  Date of birth: [birth year]  SUBJECTIVE:  Including CC & ROS.  No chief complaint on file.   Jasmin Stewart is a 55 y.o. female that is presenting with acute severe right shoulder pain.  Having pain anytime she does any form of abduction.  Also having pain with internal rotation.  Independent review of the right shoulder x-ray shows no acute changes.  Independent review of right shoulder MRI on 10/11 shows bursitis,   Review of Systems See HPI   HISTORY: Past Medical, Surgical, Social, and Family History Reviewed & Updated per EMR.   Pertinent Historical Findings include:  Past Medical History:  Diagnosis Date   Abdominal hernia    Anemia    Asthma    Fibroid, uterine    never had surgery   History of blood transfusion 1991; 1995   related to childbirth   History of chicken pox    Age 27   Hypercholesteremia    Hypoglycemia    Hypokalemia    Hypotension    Pneumonia 2013,2014, 2018   PVC (premature ventricular contraction)    Ulcer of esophagus     Past Surgical History:  Procedure Laterality Date   ABDOMINAL HERNIA REPAIR  2007; 06/05/2017   Suprapubic, Surgery 2007 with mesh VHR w/mesh; OPEN VENTRAL HERNIA REPAIR WITH [place]; 1995; 1998   ESOPHAGOGASTRODUODENOSCOPY  01/2017   HERNIA REPAIR  2007; 06/05/2017   INSERTION OF MESH N/A 06/05/2017   Procedure: INSERTION OF MESH;  Surgeon: Donnie Mesa, MD;  Location: [facility];  Service: General;  Laterality: N/A;   VENTRAL HERNIA REPAIR N/A 06/05/2017   Procedure: OPEN VENTRAL [place];  Surgeon: Donnie Mesa, MD;  Location: [facility];  Service: General;  Laterality: N/A;    Family History  Problem Relation Age of Onset   Thyroid disease Mother    Atrial fibrillation Mother    Hyperlipidemia Mother    Hyperlipidemia Sister    Breast cancer Sister    Hyperlipidemia Brother    Thyroid disease Maternal Grandmother    Glaucoma Maternal  Grandmother    Cataracts Maternal Grandmother    Esophageal cancer Maternal Grandfather    Hypertension Maternal Uncle    Obesity Maternal Uncle    Allergies Son        Hay Fever   Healthy Daughter    Colon cancer Neg Hx    Colon polyps Neg Hx    Stomach cancer Neg Hx    Rectal cancer Neg Hx     Social History   Socioeconomic History   Marital status: Single    Spouse name: Not on file   Number of children: Not on file   Years of education: Not on file   Highest education level: Not on file  Occupational History   Not on file  Tobacco Use   Smoking status: Every Day    Packs/day: 0.25    Years: 36.00    Pack years: 9.00    Types: Cigarettes   Smokeless tobacco: Never  Vaping Use   Vaping Use: Former  Substance and Sexual Activity   Alcohol use: Not Currently    Alcohol/week: 0.0 standard drinks   Drug use: No   Sexual activity: Yes    Birth control/protection: Post-menopausal    Comment: 1st intercourse 55 yo-More than 5 partners  Other Topics Concern   Not on file  Social History Narrative  Not on file   Social Determinants of Health   Financial Resource Strain: Not on file  Food Insecurity: Not on file  Transportation Needs: Not on file  Physical Activity: Not on file  Stress: Not on file  Social Connections: Not on file  Intimate Partner Violence: Not on file     PHYSICAL EXAM:  VS: Ht 5' 6.5" (1.689 m)   Wt 230 lb (104.3 kg)   BMI 36.57 kg/m  Physical Exam Gen: NAD, alert, cooperative with exam, well-appearing    Aspiration/Injection Procedure Note Jasmin Stewart 08/27/1965  Procedure: Injection Indications: Right shoulder pain  Procedure Details Consent: Risks of procedure as well as the alternatives and risks of each were explained to the (patient/caregiver).  Consent for procedure obtained. Time Out: Verified patient identification, verified procedure, site/side was marked, verified correct patient position, special equipment/implants  available, medications/allergies/relevent history reviewed, required imaging and test results available.  Performed.  The area was cleaned with iodine and alcohol swabs.    The right subacromial space was injected using 1 cc's of 40 mg Depo-Medrol and 4 cc's of 0.25% bupivacaine with a 21 1 1/2" needle.  Ultrasound was used. Images were obtained in long views showing the injection.     A sterile dressing was applied.  Patient did tolerate procedure well.     ASSESSMENT & PLAN:   Subacromial bursitis of right shoulder joint She does have calcific changes of the supraspinatus.  Symptoms seem more consistent with a bursitis.  Does have degenerative changes of the [facility] joint.  Likely has a component of the radicular type pain given the severity of the pain -Counseled on home exercise therapy and supportive care. -Injection today. -Could consider shockwave therapy

## 2021-01-11 NOTE — Patient Instructions (Addendum)
For bursitis and tendinitis of shoulder follow up with Dr.  Jolly. Would expect current comprehensive med regimen will eventually resolve your pain.  For high cholesterol continue current statin and placed future lipid panel.  For elevated sugar placed  future cmp and a1c level.  For vit d deficiency hx placed future vit D level.  For obesity continue lifestyle changes /diet changes advised by weight loss management. Keep up with weight loss.  Follow up date to be determined after lab review.

## 2021-01-11 NOTE — Progress Notes (Signed)
Subjective:    Patient ID: Jasmin Stewart, female    DOB: May 26, 1965, 55 y.o.   MRN: [id number]  HPI  Pt in for follow up.   Pt just seen in ED for rt shoulder pain/buritis. I reviewed not on day of visit when ED sent me the note.  Arrival date & time: 01/08/21  4098   A/P   "Patient presents for 4 days of worsening right shoulder pain.  She denies any recent injuries.  Pain is located throughout her right shoulder.  On exam, she has tenderness throughout the lateral aspect of her right shoulder.  Pain is worsened with active and passive range of motion.  Due to the pain, patient is distressed and anxious.  At times, she is tearful describing her anxiety about having to go to work with this shoulder pain.  Last doses of pain medication were approximately 4 AM this morning.  Toradol and Robaxin were given in the ED.  X-ray imaging showed calcific tendinitis with no other acute findings.  I have low suspicion for septic joint.  Patient's presentation is consistent with frozen shoulder, however, the onset over the past 4 days would be peculiar.  MRI was ordered to identify possible frozen shoulder or other etiologies of her acute severe pain and disability.  At time of signout, results of MRI imaging was pending.  Care of patient was signed out to oncoming ED provider."   Mri results on 01-08-2021  IMPRESSION: 1. Mild supraspinatus and infraspinatus tendinosis with calcific tendinitis. No rotator cuff tendon tear. 2. Moderate subacromial/subdeltoid bursitis. 3. Moderate acromioclavicular osteoarthritis.    Pt just saw Dr. Jordan Likes. She got injection today. On review she got depomedrol im.  Pt also has gabapentin. Lidocaine patch and muscle relaxant. Also has voltaren gel to apply.    Pt updates me that she still has been trying to lose weight. Exerciing and eating healthier. Lowest weight was 227 lb. In past she states high was 243 lbs.   Eating 1500 calories. In past went to weight loss  clinic.   Pt has high cholesterol. She is on crestor.  Hx of prediabetes.   Pt had 4 covid vaccines. Most recent august 2022.   Pt seeing Dr Allyson Sabal cardiologist next month.    Review of Systems  Constitutional:  Negative for chills, fatigue and fever.  Respiratory:  Negative for cough, chest tightness, shortness of breath and wheezing.   Cardiovascular:  Negative for chest pain and palpitations.  Gastrointestinal:  Negative for abdominal pain.  Genitourinary:  Negative for dysuria and flank pain.  Musculoskeletal:  Negative for back pain.       Rt shoulder pain.    Past Medical History:  Diagnosis Date   Abdominal hernia    Anemia    Asthma    Fibroid, uterine    never had surgery   History of blood transfusion 1991; 1995   related to childbirth   History of chicken pox    Age 64   Hypercholesteremia    Hypoglycemia    Hypokalemia    Hypotension    Pneumonia 2013,2014, 2018   PVC (premature ventricular contraction)    Ulcer of esophagus      Social History   Socioeconomic History   Marital status: Single    Spouse name: Not on file   Number of children: Not on file   Years of education: Not on file   Highest education level: Not on file  Occupational History  Not on file  Tobacco Use   Smoking status: Every Day    Packs/day: 0.25    Years: 36.00    Pack years: 9.00    Types: Cigarettes   Smokeless tobacco: Never  Vaping Use   Vaping Use: Former  Substance and Sexual Activity   Alcohol use: Not Currently    Alcohol/week: 0.0 standard drinks   Drug use: No   Sexual activity: Yes    Birth control/protection: Post-menopausal    Comment: 1st intercourse 55 yo-More than 5 partners  Other Topics Concern   Not on file  Social History Narrative   Not on file   Social Determinants of Health   Financial Resource Strain: Not on file  Food Insecurity: Not on file  Transportation Needs: Not on file  Physical Activity: Not on file  Stress: Not on file   Social Connections: Not on file  Intimate Partner Violence: Not on file    Past Surgical History:  Procedure Laterality Date   ABDOMINAL HERNIA REPAIR  2007; 06/05/2017   Suprapubic, Surgery 2007 with mesh VHR w/mesh; OPEN VENTRAL HERNIA REPAIR WITH MESH   CESAREAN SECTION  1991; 1995; 1998   ESOPHAGOGASTRODUODENOSCOPY  01/2017   HERNIA REPAIR  2007; 06/05/2017   INSERTION OF MESH N/A 06/05/2017   Procedure: INSERTION OF MESH;  Surgeon: Manus Rudd, MD;  Location: [facility];  Service: General;  Laterality: N/A;   VENTRAL HERNIA REPAIR N/A 06/05/2017   Procedure: OPEN VENTRAL HERNIA REPAIR WITH MESH;  Surgeon: Manus Rudd, MD;  Location: [facility];  Service: General;  Laterality: N/A;    Family History  Problem Relation Age of Onset   Thyroid disease Mother    Atrial fibrillation Mother    Hyperlipidemia Mother    Hyperlipidemia Sister    Breast cancer Sister    Hyperlipidemia Brother    Thyroid disease Maternal Grandmother    Glaucoma Maternal Grandmother    Cataracts Maternal Grandmother    Esophageal cancer Maternal Grandfather    Hypertension Maternal Uncle    Obesity Maternal Uncle    Allergies Son        Hay Fever   Healthy Daughter    Colon cancer Neg Hx    Colon polyps Neg Hx    Stomach cancer Neg Hx    Rectal cancer Neg Hx     No Known Allergies  Current Outpatient Medications on File Prior to Visit  Medication Sig Dispense Refill   diclofenac Sodium (VOLTAREN) 1 % GEL Apply 4 g topically 4 (four) times daily. 150 g 0   esomeprazole (NEXIUM) 20 MG capsule Take 20 mg by mouth daily as needed.      gabapentin (NEURONTIN) 100 MG capsule Take 1 capsule (100 mg total) by mouth 3 (three) times daily. 30 capsule 3   HYDROcodone-acetaminophen (NORCO/VICODIN) 5-325 MG tablet Take 1 tablet by mouth every 8 (eight) hours as needed. 15 tablet 0   lidocaine (LIDODERM) 5 % Place 1 patch onto the skin daily. Remove & Discard patch within 12 hours or as directed by MD 30 patch 0    nitroGLYCERIN (NITRODUR - DOSED IN MG/24 HR) 0.2 mg/hr patch Cut and apply 1/4 patch to most painful area q24h. 30 patch 0   rosuvastatin (CRESTOR) 10 MG tablet Take 1 tablet (10 mg total) by mouth daily. 30 tablet 3   No current facility-administered medications on file prior to visit.    BP 118/61   Pulse 78   Resp 18   Ht  5' 6.5" (1.689 m)   Wt 232 lb (105.2 kg)   SpO2 96%   BMI 36.88 kg/m       Objective:   Physical Exam  General- No acute distress. Pleasant patient. Neck- Full range of motion, no jvd Lungs- Clear, even and unlabored. Heart- regular rate and rhythm. Neurologic- CNII- XII grossly intact.   Rt shoulder- mild tender to palpation.         Assessment & Plan:   Patient Instructions  For bursitis and tendinitis of shoulder follow up with Dr. Emilee Hero. Would expect current comprehensive med regimen will eventually resolve your pain.  For high cholesterol continue current statin and placed future lipid panel.  For elevated sugar placed  future cmp and a1c level.  For vit d deficiency hx placed future vit D level.  For obesity continue lifestyle changes /diet changes advised by weight loss management. Keep up with weight loss.  Follow up date to be determined after lab review.   Esperanza Richters, PA-C    Time spent with patient today was 31  minutes which consisted of chart revdiew, discussing diagnosis, work up treatment and documentation.

## 2021-01-11 NOTE — Assessment & Plan Note (Signed)
She does have calcific changes of the supraspinatus.  Symptoms seem more consistent with a bursitis.  Does have degenerative changes of the [facility] joint.  Likely has a component of the radicular type pain given the severity of the pain -Counseled on home exercise therapy and supportive care. -Injection today. -Could consider shockwave therapy

## 2021-01-12 ENCOUNTER — Encounter: Payer: Self-pay | Admitting: Medical

## 2021-01-24 ENCOUNTER — Emergency Department (HOSPITAL_BASED_OUTPATIENT_CLINIC_OR_DEPARTMENT_OTHER)
Admission: EM | Admit: 2021-01-24 | Discharge: 2021-01-24 | Disposition: A | Discharge status: Home / Self Care | Payer: BC Managed Care – PPO | Attending: Emergency Medicine | Admitting: Emergency Medicine

## 2021-01-24 ENCOUNTER — Encounter (HOSPITAL_BASED_OUTPATIENT_CLINIC_OR_DEPARTMENT_OTHER): Payer: Self-pay | Admitting: Emergency Medicine

## 2021-01-24 ENCOUNTER — Emergency Department (HOSPITAL_BASED_OUTPATIENT_CLINIC_OR_DEPARTMENT_OTHER): Payer: BC Managed Care – PPO

## 2021-01-24 ENCOUNTER — Other Ambulatory Visit: Payer: Self-pay

## 2021-01-24 DIAGNOSIS — J45909 Unspecified asthma, uncomplicated: Secondary | ICD-10-CM | POA: Insufficient documentation

## 2021-01-24 DIAGNOSIS — Y92480 Sidewalk as the place of occurrence of the external cause: Secondary | ICD-10-CM | POA: Diagnosis not present

## 2021-01-24 DIAGNOSIS — S8992XA Unspecified injury of left lower leg, initial encounter: Secondary | ICD-10-CM | POA: Diagnosis not present

## 2021-01-24 DIAGNOSIS — W010XXA Fall on same level from slipping, tripping and stumbling without subsequent striking against object, initial encounter: Secondary | ICD-10-CM | POA: Insufficient documentation

## 2021-01-24 DIAGNOSIS — Z79899 Other long term (current) drug therapy: Secondary | ICD-10-CM | POA: Insufficient documentation

## 2021-01-24 DIAGNOSIS — S80212A Abrasion, left knee, initial encounter: Secondary | ICD-10-CM | POA: Diagnosis not present

## 2021-01-24 DIAGNOSIS — F17210 Nicotine dependence, cigarettes, uncomplicated: Secondary | ICD-10-CM | POA: Diagnosis not present

## 2021-01-24 DIAGNOSIS — S80211A Abrasion, right knee, initial encounter: Secondary | ICD-10-CM | POA: Insufficient documentation

## 2021-01-24 DIAGNOSIS — M25562 Pain in left knee: Secondary | ICD-10-CM | POA: Diagnosis not present

## 2021-01-24 DIAGNOSIS — M25462 Effusion, left knee: Secondary | ICD-10-CM | POA: Diagnosis not present

## 2021-01-24 DIAGNOSIS — S50812A Abrasion of left forearm, initial encounter: Secondary | ICD-10-CM | POA: Diagnosis not present

## 2021-01-24 DIAGNOSIS — M13862 Other specified arthritis, left knee: Secondary | ICD-10-CM | POA: Diagnosis not present

## 2021-01-24 DIAGNOSIS — S50811A Abrasion of right forearm, initial encounter: Secondary | ICD-10-CM | POA: Diagnosis not present

## 2021-01-24 NOTE — ED Triage Notes (Signed)
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 Tripped over lifted cement on sidewalk, landed forwards, onto elbows, knees, chest and abdomen. Did not hit head, no LOC or thinners. Has been using Voltaren w/no relief. Main c/o of L knee pain and swelling, limited ROM

## 2021-01-24 NOTE — ED Provider Notes (Signed)
[place] EMERGENCY DEPARTMENT Provider Note   CSN: [id number] Arrival date & time: 01/24/21  0547     History Chief Complaint  Patient presents with   Fall   Knee Pain    Jasmin Stewart is a 55 y.o. female.  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  She states that the sidewalk had a raised lip that she tripped on and fell forwards complaining of bilateral knee pain but worse on her left knee.  This occurred around 4 PM yesterday, symptoms did not improve and she presents to the ER last night.  Otherwise denies head injury or loss of consciousness.  Denies back pain.  Denies fevers cough vomiting or diarrhea.      Past Medical History:  Diagnosis Date   Abdominal hernia    Anemia    Asthma    Fibroid, uterine    never had surgery   History of blood transfusion 1991; 1995   related to childbirth   History of chicken pox    Age 40   Hypercholesteremia    Hypoglycemia    Hypokalemia    Hypotension    Pneumonia 2013,2014, 2018   PVC (premature ventricular contraction)    Ulcer of esophagus     Patient Active Problem List   Diagnosis Date Noted   Subacromial bursitis of right shoulder joint 01/11/2021   Cervical radiculopathy 01/03/2021   Enteritis 10/24/2019   Calcific tendinitis of left shoulder 12/02/2018   Hyperlipidemia 04/19/2018   Ventral incisional hernia 06/05/2017   PVC's (premature ventricular contractions) 06/09/2016    Past Surgical History:  Procedure Laterality Date   ABDOMINAL HERNIA REPAIR  2007; 06/05/2017   Suprapubic, Surgery 2007 with mesh VHR w/mesh; OPEN VENTRAL HERNIA REPAIR WITH [place]; 1995; 1998   ESOPHAGOGASTRODUODENOSCOPY  01/2017   HERNIA REPAIR  2007; 06/05/2017   INSERTION OF MESH N/A 06/05/2017   Procedure: INSERTION OF MESH;  Surgeon: Donnie Mesa, MD;  Location: [facility];  Service: General;  Laterality: N/A;   VENTRAL HERNIA REPAIR N/A 06/05/2017   Procedure: [place];  Surgeon: Donnie Mesa, MD;  Location: [facility];  Service: General;  Laterality: N/A;     OB History     Gravida  7   Para  4   Term      Preterm      AB  3   Living  4      SAB      IAB      Ectopic      Multiple      Live Births              Family History  Problem Relation Age of Onset   Thyroid disease Mother    Atrial fibrillation Mother    Hyperlipidemia Mother    Hyperlipidemia Sister    Breast cancer Sister    Hyperlipidemia Brother    Thyroid disease Maternal Grandmother    Glaucoma Maternal Grandmother    Cataracts Maternal Grandmother    Esophageal cancer Maternal Grandfather    Hypertension Maternal Uncle    Obesity Maternal Uncle    Allergies Son        Hay Fever   Healthy Daughter    Colon cancer Neg Hx    Colon polyps Neg Hx    Stomach cancer Neg Hx    Rectal cancer Neg Hx     Social History  Tobacco Use   Smoking status: Every Day    Packs/day: 0.25    Years: 36.00    Pack years: 9.00    Types: Cigarettes   Smokeless tobacco: Never  Vaping Use   Vaping Use: Former  Substance Use Topics   Alcohol use: Not Currently    Alcohol/week: 0.0 standard drinks   Drug use: No    Home Medications Prior to Admission medications   Medication Sig Start Date End Date Taking? Authorizing Provider  diclofenac Sodium (VOLTAREN) 1 % GEL Apply 4 g topically 4 (four) times daily. 01/08/21   Malvin Johns, MD  esomeprazole (NEXIUM) 20 MG capsule Take 20 mg by mouth daily as needed.     [provider]  gabapentin (NEURONTIN) 100 MG capsule Take 1 capsule (100 mg total) by mouth 3 (three) times daily. 01/03/21   Rosemarie Ax, MD  HYDROcodone-acetaminophen (NORCO/VICODIN) 5-325 MG tablet Take 1 tablet by mouth every 8 (eight) hours as needed. 01/07/21   Rosemarie Ax, MD  lidocaine (LIDODERM) 5 % Place 1 patch onto the skin daily. Remove & Discard patch within 12 hours or as directed by MD 01/08/21    Malvin Johns, MD  nitroGLYCERIN (NITRODUR - DOSED IN MG/24 HR) 0.2 mg/hr patch Cut and apply 1/4 patch to most painful area q24h. 11/03/20   Rosemarie Ax, MD  rosuvastatin (CRESTOR) 10 MG tablet Take 1 tablet (10 mg total) by mouth daily. 01/03/21   Saguier, Percell Miller, PA-C    Allergies    Patient has no known allergies.  Review of Systems   Review of Systems  Constitutional:  Negative for fever.  HENT:  Negative for ear pain.   Eyes:  Negative for pain.  Respiratory:  Negative for cough.   Cardiovascular:  Negative for chest pain.  Gastrointestinal:  Negative for abdominal pain.  Genitourinary:  Negative for flank pain.  Musculoskeletal:  Negative for back pain.  Skin:  Negative for rash.  Neurological:  Negative for headaches.   Physical Exam Updated Vital Signs BP 118/73 (BP Location: Right Arm)   Pulse 76   Temp 98.5 F (36.9 C) (Oral)   Resp 18   Wt 105.2 kg   SpO2 97%   BMI 36.87 kg/m   Physical Exam Constitutional:      General: She is not in acute distress.    Appearance: Normal appearance.  HENT:     Head: Normocephalic.     Nose: Nose normal.  Eyes:     Extraocular Movements: Extraocular movements intact.  Cardiovascular:     Rate and Rhythm: Normal rate.  Pulmonary:     Effort: Pulmonary effort is normal.  Musculoskeletal:     Cervical back: Normal range of motion.     Comments: Patient ranging her right knee well without pain or discomfort.  No varus or valgus laxity no posterior or anterior laxity no tenderness palpation noted.  Left knee however she can range it fully but very slowly due to pain.  Left knee does appear swollen mild abrasion on the kneecap.  Tenderness to joint space of the left knee.  Otherwise neurovascularly intact bilateral upper and lower extremities.  No C or T or L-spine tenderness or step-offs noted.  Patient ranging bilateral upper extremities and neck fine without any pain or discomfort.  Skin:    Comments: Superficial  mild abrasion seen on bilateral forearms and bilateral kneecaps.  Neurological:     General: No focal deficit present.  Mental Status: She is alert. Mental status is at baseline.    ED Results / Procedures / Treatments   Labs (all labs ordered are listed, but only abnormal results are displayed) Labs Reviewed - No data to display  EKG None  Radiology DG Knee Complete 4 Views Left  Result Date: 01/24/2021 CLINICAL DATA:  Pain post fall EXAM: LEFT KNEE - COMPLETE 4+ VIEW COMPARISON:  None. FINDINGS: No evidence of fracture or dislocation. Small effusion in the suprapatellar bursa. There is mild narrowing of the tracheal cartilage in the medial compartment with early tricompartmental marginal spurring IMPRESSION: 1. Negative for fracture or other acute bone abnormality. 2. Tricompartmental DJD most marked medially, with small effusion Electronically Signed   By: Lucrezia Europe M.D.   On: 01/24/2021 06:54    Procedures .Ortho Injury Treatment  Date/Time: 01/24/2021 7:31 AM Performed by: Luna Fuse, MD Authorized by: Luna Fuse, MD  Immobilization: crutches Post-procedure neurovascular assessment: post-procedure neurovascularly intact Comments: Crutches and left knee immobilizer placed.  Neurovascular intact after placement.     Medications Ordered in ED Medications - No data to display  ED Course  I have reviewed the triage vital signs and the nursing notes.  Pertinent labs & imaging results that were available during my care of the patient were reviewed by me and considered in my medical decision making (see chart for details).    MDM Rules/Calculators/A&P                           X-rays of the left knee shows tricompartment arthritis, no fracture noted small effusion noted.  Patient placed in a knee immobilizer and crutch training provided.  Advised outpatient follow-up with her sports medicine physician within the week.  Advising return for worsening pain or  fevers.  Patient declined pain medications while here in the ER.  Final Clinical Impression(s) / ED Diagnoses Final diagnoses:  Acute pain of left knee    Rx / DC Orders ED Discharge Orders     None        Luna Fuse, MD 01/24/21 [phone]

## 2021-01-24 NOTE — Discharge Instructions (Addendum)
Call your primary care doctor or specialist as discussed in the next 2-3 days.   Return immediately back to the ER if:  Your symptoms worsen within the next 12-24 hours. You develop new symptoms such as new fevers, persistent vomiting, new pain, shortness of breath, or new weakness or numbness, or if you have any other concerns.  

## 2021-01-24 NOTE — ED Notes (Signed)
Pt discharged to home. Discharge instructions have been discussed with patient and/or family members. Pt verbally acknowledges understanding d/c instructions, and endorses comprehension to checkout at registration before leaving.  °

## 2021-01-24 NOTE — ED Notes (Signed)
EMT at bedside to apply knee immobilizer and use of crutches

## 2021-01-28 ENCOUNTER — Encounter: Payer: Self-pay | Admitting: Medical

## 2021-01-28 ENCOUNTER — Other Ambulatory Visit: Payer: Self-pay

## 2021-01-28 ENCOUNTER — Other Ambulatory Visit (INDEPENDENT_AMBULATORY_CARE_PROVIDER_SITE_OTHER): Payer: BC Managed Care – PPO

## 2021-01-28 DIAGNOSIS — E7849 Other hyperlipidemia: Secondary | ICD-10-CM | POA: Diagnosis not present

## 2021-01-28 DIAGNOSIS — E559 Vitamin D deficiency, unspecified: Secondary | ICD-10-CM | POA: Diagnosis not present

## 2021-01-28 DIAGNOSIS — R739 Hyperglycemia, unspecified: Secondary | ICD-10-CM

## 2021-01-28 DIAGNOSIS — Z1231 Encounter for screening mammogram for malignant neoplasm of breast: Secondary | ICD-10-CM | POA: Diagnosis not present

## 2021-01-28 LAB — LIPID PANEL
Cholesterol: 180 mg/dL (ref 0–200)
HDL: 42.2 mg/dL (ref >39.00)
LDL Cholesterol: 108 mg/dL — ABNORMAL HIGH (ref 0–99)
NonHDL: 138.08
Total CHOL/HDL Ratio: 4
Triglycerides: 148 mg/dL (ref 0.0–149.0)
VLDL: 29.6 mg/dL (ref 0.0–40.0)

## 2021-01-28 LAB — HEMOGLOBIN A1C: Hgb A1c MFr Bld: 6.2 % (ref 4.6–6.5)

## 2021-01-28 LAB — COMPREHENSIVE METABOLIC PANEL
ALT: 12 U/L (ref 0–35)
AST: 12 U/L (ref 0–37)
Albumin: 4.2 g/dL (ref 3.5–5.2)
Alkaline Phosphatase: 95 U/L (ref 39–117)
BUN: 17 mg/dL (ref 6–23)
CO2: 27 mEq/L (ref 19–32)
Calcium: 9.6 mg/dL (ref 8.4–10.5)
Chloride: 104 mEq/L (ref 96–112)
Creatinine, Ser: 0.67 mg/dL (ref 0.40–1.20)
GFR: 99.82 mL/min (ref >60.00)
Glucose, Bld: 121 mg/dL — ABNORMAL HIGH (ref 70–99)
Potassium: 4.3 mEq/L (ref 3.5–5.1)
Sodium: 139 mEq/L (ref 135–145)
Total Bilirubin: 0.5 mg/dL (ref 0.2–1.2)
Total Protein: 6.8 g/dL (ref 6.0–8.3)

## 2021-01-31 ENCOUNTER — Encounter: Payer: Self-pay | Admitting: Medical

## 2021-02-01 ENCOUNTER — Other Ambulatory Visit: Payer: Self-pay | Admitting: *Deleted

## 2021-02-01 LAB — VITAMIN D 1,25 DIHYDROXY
Vitamin D 1, 25 (OH)2 Total: 52 pg/mL (ref 18–72)
Vitamin D2 1, 25 (OH)2: 10 pg/mL
Vitamin D3 1, 25 (OH)2: 42 pg/mL

## 2021-02-01 MED ORDER — NITROGLYCERIN 0.2 MG/HR TD PT24
MEDICATED_PATCH | TRANSDERMAL | 0 refills | Status: DC
Start: 2021-02-01 — End: 2021-03-07

## 2021-02-03 ENCOUNTER — Ambulatory Visit: Payer: BC Managed Care – PPO | Admitting: Family Medicine

## 2021-02-04 ENCOUNTER — Other Ambulatory Visit: Payer: Self-pay

## 2021-02-04 ENCOUNTER — Encounter: Payer: Self-pay | Admitting: Medical

## 2021-02-04 ENCOUNTER — Ambulatory Visit (INDEPENDENT_AMBULATORY_CARE_PROVIDER_SITE_OTHER): Payer: BC Managed Care – PPO | Admitting: Medical

## 2021-02-04 VITALS — BP 116/72 | HR 79 | Resp 18 | Ht 66.0 in | Wt 231.0 lb

## 2021-02-04 DIAGNOSIS — G4700 Insomnia, unspecified: Secondary | ICD-10-CM | POA: Diagnosis not present

## 2021-02-04 DIAGNOSIS — M25561 Pain in right knee: Secondary | ICD-10-CM

## 2021-02-04 DIAGNOSIS — R42 Dizziness and giddiness: Secondary | ICD-10-CM

## 2021-02-04 DIAGNOSIS — F32A Depression, unspecified: Secondary | ICD-10-CM | POA: Diagnosis not present

## 2021-02-04 DIAGNOSIS — R002 Palpitations: Secondary | ICD-10-CM | POA: Diagnosis not present

## 2021-02-04 MED ORDER — TRAZODONE HCL 50 MG PO TABS: 25.0000 mg | ORAL_TABLET | Freq: Every evening | ORAL | 0 refills | Status: DC | PRN

## 2021-02-04 MED ORDER — MECLIZINE HCL 12.5 MG PO TABS: 12.5000 mg | ORAL_TABLET | Freq: Three times a day (TID) | ORAL | 0 refills | Status: DC | PRN

## 2021-02-04 NOTE — Patient Instructions (Addendum)
Recent description of mostly vertigo over the past week.  Normal neurologic exam today.  No vertigo sensation since yesterday.  Will prescribe meclizine to use every 8 hours as needed as needed dizziness/vertigo.  Recommend using if symptoms lasting for more than 5 minutes.  Stay well-hydrated.  If you have dizziness or vertigo with gross motor or sensory function deficits I did recommend ED evaluation for CT imaging.  Presently imaging not indicated.   Would recommend that you try Epley maneuvers.  Print out education sheet given today.  You could also go online and see if video available for Epley maneuvers.  You are probably able to review a video of the maneuvers performed by physical therapist.  History of palpitations in the past.  We did EKG today.  It showed normal sinus rhythm.  I recommended to keep appointment with cardiologist coming next week.  Avoid any caffeine.  Depression and insomnia recently.  PHQ-9 score was mildly high.  Recommend trying trazodone low-dose at night and we will see if sleeping better helps her mood.  Consider other depression medication if needed.  Fall recently with very tender right knee on palpation.  Placed right knee x-ray ordered today.  You can get that done at the x-ray department.  Follow up in 2 weeks or sooner if needed.

## 2021-02-04 NOTE — Progress Notes (Signed)
Subjective:    Patient ID: Jasmin Stewart, female    DOB: [birth year], 55 y.o.   MRN: [id number]  HPI    Pt states last week on Thursday at work she felt woozy, light headed and started to feel like she was spinning. She started to sweat some. On Thursday lasted 2-3 minutes. On Friday she was laying in bed and had other event she states felt like room was spinnig. This lasted about a minute.   Same happened Sunday, Tuesday and Thursday. All the same.(Similar events).  No motor or sensory deficits associated with the above.  Pt not sleeping well. Pt feeling depressed. Scores 11 on phq-9 score. Pt feels like mood some better when she sleeps. Denies snoring. Talks to her ex and states that depresses her.   Pt has some palpitations at time. About once a day. Last time 12 noon. Pt will see Dr. Gwenlyn Found her cardiologist next week.   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 She tripped and landed on both knees then fell forward hitting left elbow. No head trauma or LOC. This happened 2 weeks ago.     Review of Systems  Constitutional:  Negative for chills, fatigue and fever.  Respiratory:  Negative for cough, chest tightness, shortness of breath and wheezing.   Cardiovascular:  Positive for palpitations. Negative for chest pain.  Gastrointestinal:  Negative for abdominal pain, diarrhea, nausea and vomiting.  Genitourinary:  Negative for dysuria.  Musculoskeletal:  Negative for back pain, myalgias and neck pain.  Skin:  Negative for rash.  Neurological:  Positive for dizziness. Negative for speech difficulty, weakness, numbness and headaches.       See hpi. But not presently. More vertigo like.  Hematological:  Negative for adenopathy. Does not bruise/bleed easily.  Psychiatric/Behavioral:  Positive for dysphoric mood and sleep disturbance. Negative for behavioral problems, confusion and self-injury. The patient is not nervous/anxious.     Past Medical History:   Diagnosis Date   Abdominal hernia    Anemia    Asthma    Fibroid, uterine    never had surgery   History of blood transfusion 1991; 1995   related to childbirth   History of chicken pox    Age 24   Hypercholesteremia    Hypoglycemia    Hypokalemia    Hypotension    Pneumonia 2013,2014, 2018   PVC (premature ventricular contraction)    Ulcer of esophagus      Social History   Socioeconomic History   Marital status: Single    Spouse name: Not on file   Number of children: Not on file   Years of education: Not on file   Highest education level: Not on file  Occupational History   Not on file  Tobacco Use   Smoking status: Every Day    Packs/day: 0.25    Years: 36.00    Pack years: 9.00    Types: Cigarettes   Smokeless tobacco: Never  Vaping Use   Vaping Use: Former  Substance and Sexual Activity   Alcohol use: Not Currently    Alcohol/week: 0.0 standard drinks   Drug use: No   Sexual activity: Yes    Birth control/protection: Post-menopausal    Comment: 1st intercourse 55 yo-More than 5 partners  Other Topics Concern   Not on file  Social History Narrative   Not on file   Social Determinants of Health   Financial Resource Strain: Not on file  Food Insecurity: Not  on file  Transportation Needs: Not on file  Physical Activity: Not on file  Stress: Not on file  Social Connections: Not on file  Intimate Partner Violence: Not on file    Past Surgical History:  Procedure Laterality Date   ABDOMINAL HERNIA REPAIR  2007; 06/05/2017   Suprapubic, Surgery 2007 with mesh VHR w/mesh; OPEN VENTRAL HERNIA REPAIR WITH [place]; 1995; 1998   ESOPHAGOGASTRODUODENOSCOPY  01/2017   HERNIA REPAIR  2007; 06/05/2017   INSERTION OF MESH N/A 06/05/2017   Procedure: INSERTION OF MESH;  Surgeon: Donnie Mesa, MD;  Location: [facility];  Service: General;  Laterality: N/A;   VENTRAL HERNIA REPAIR N/A 06/05/2017   Procedure: [place];  Surgeon: Donnie Mesa, MD;  Location: [place];  Service: General;  Laterality: N/A;    Family History  Problem Relation Age of Onset   Thyroid disease Mother    Atrial fibrillation Mother    Hyperlipidemia Mother    Hyperlipidemia Sister    Breast cancer Sister    Hyperlipidemia Brother    Thyroid disease Maternal Grandmother    Glaucoma Maternal Grandmother    Cataracts Maternal Grandmother    Esophageal cancer Maternal Grandfather    Hypertension Maternal Uncle    Obesity Maternal Uncle    Allergies Son        Hay Fever   Healthy Daughter    Colon cancer Neg Hx    Colon polyps Neg Hx    Stomach cancer Neg Hx    Rectal cancer Neg Hx     No Known Allergies  Current Outpatient Medications on File Prior to Visit  Medication Sig Dispense Refill   diclofenac Sodium (VOLTAREN) 1 % GEL Apply 4 g topically 4 (four) times daily. 150 g 0   esomeprazole (NEXIUM) 20 MG capsule Take 20 mg by mouth daily as needed.      gabapentin (NEURONTIN) 100 MG capsule Take 1 capsule (100 mg total) by mouth 3 (three) times daily. 30 capsule 3   HYDROcodone-acetaminophen (NORCO/VICODIN) 5-325 MG tablet Take 1 tablet by mouth every 8 (eight) hours as needed. 15 tablet 0   lidocaine (LIDODERM) 5 % Place 1 patch onto the skin daily. Remove & Discard patch within 12 hours or as directed by MD 30 patch 0   nitroGLYCERIN (NITRODUR - DOSED IN MG/24 HR) 0.2 mg/hr patch Cut and apply 1/4 patch to most painful area q24h. 30 patch 0   rosuvastatin (CRESTOR) 10 MG tablet Take 1 tablet (10 mg total) by mouth daily. 30 tablet 3   No current facility-administered medications on file prior to visit.    BP 108/72   Pulse 79   Resp 18   Ht 5\' 6"  (1.676 m)   Wt 231 lb (104.8 kg)   SpO2 99%   BMI 37.28 kg/m        Objective:   Physical Exam  General Mental Status- Alert. General Appearance- Not in acute distress.   Skin General: Color- Normal Color. Moisture- Normal Moisture.  Neck Carotid  Arteries- Normal color. Moisture- Normal Moisture. No carotid bruits. No JVD.  Chest and Lung Exam Auscultation: Breath Sounds:-Normal.  Cardiovascular Auscultation:Rythm- Regular. Murmurs & Other Heart Sounds:Auscultation of the heart reveals- No Murmurs.  Abdomen Inspection:-Inspeection Normal. Palpation/Percussion:Note:No mass. Palpation and Percussion of the abdomen reveal- Non Tender, Non Distended + BS, no rebound or guarding.   Neurologic Cranial Nerve exam:- CN III-XII intact(No nystagmus), symmetric smile.  EOM  movements intact but did feel slightly lightheaded during testing. Finger to Nose:- Normal/Intact Strength:- 5/5 equal and symmetric strength both upper and lower extremities.  On lying supine and turning head to the right and left no dizziness or vertigo.  Lt knee-no obvious pain on palpation.  Good range of motion. Right knee-very tender to even light palpation over patella. Upper extremities no pain on palpation.  Good range of motion of elbows and no pain on palpation.    Assessment & Plan:   . Patient Instructions  Recent description of mostly vertigo over the past week.  Normal neurologic exam today.  No vertigo sensation since yesterday.  Will prescribe meclizine to use every 8 hours as needed as needed dizziness/vertigo.  Recommend using if symptoms lasting for more than 5 minutes.  Stay well-hydrated.  If you have dizziness or vertigo with gross motor or sensory function deficits I did recommend ED evaluation for CT imaging.  Presently imaging not indicated.   Would recommend that you try Epley maneuvers.  Print out education sheet given today.  You could also go online and see if video available for Epley maneuvers.  You are probably able to review a video of the maneuvers performed by physical therapist.  History of palpitations in the past.  We did EKG today.  It showed normal sinus rhythm.  I recommended to keep appointment with cardiologist coming  next week.  Avoid any caffeine.  Depression and insomnia recently.  PHQ-9 score was mildly high.  Recommend trying trazodone low-dose at night and we will see if sleeping better helps her mood.  Consider other depression medication if needed.  Fall recently with very tender right knee on palpation.  Placed right knee x-ray ordered today.  You can get that done at the x-ray department.   Mackie Pai, PA-C   Time spent with patient today was  40 minutes which consisted of chart revdiew, discussing diagnosis, work up treatment and documentation.

## 2021-02-11 ENCOUNTER — Ambulatory Visit (INDEPENDENT_AMBULATORY_CARE_PROVIDER_SITE_OTHER): Payer: BC Managed Care – PPO

## 2021-02-11 ENCOUNTER — Encounter: Payer: Self-pay | Admitting: Cardiovascular Disease

## 2021-02-11 ENCOUNTER — Ambulatory Visit (INDEPENDENT_AMBULATORY_CARE_PROVIDER_SITE_OTHER): Payer: BC Managed Care – PPO | Admitting: Cardiovascular Disease

## 2021-02-11 ENCOUNTER — Other Ambulatory Visit: Payer: Self-pay

## 2021-02-11 DIAGNOSIS — I493 Ventricular premature depolarization: Secondary | ICD-10-CM

## 2021-02-11 DIAGNOSIS — E782 Mixed hyperlipidemia: Secondary | ICD-10-CM | POA: Diagnosis not present

## 2021-02-11 MED ORDER — METOPROLOL TARTRATE 25 MG PO TABS: 12.5000 mg | ORAL_TABLET | Freq: Two times a day (BID) | ORAL | 3 refills | Status: DC

## 2021-02-11 NOTE — Progress Notes (Signed)
02/11/2021 [name]   11-May-1965  [id number]  Primary Physician Saguier, Iris Pert Primary Cardiologist: Lorretta Harp MD Jasmin Stewart, [place]  HPI:  Jasmin Stewart is a 55 y.o.  mildly overweight female mother of 50, grandmother of 3 grandchildren works at [employer] in [employer] .  I last saw her in the office 04/19/2018.  Her cardiac risk factors include 10-20-pack-year history of tobacco abuse currently smoking one third pack per day but otherwise negative. She did have episode of palpitations back in May 2012 and had a Holter monitor that showed PVCs. She was on metoprolol for 6 months which was then discontinued. She recently had sinusitis and pneumonia was treated with bronchodilators and pseudoephedrine. She did notice some palpitations similar to her prior PVCs over these have since resolved and the left several weeks.   Since I saw her almost 3 years ago she has had increased frequency of her palpitations recently.  She has a lot of arthritic pain in her neck shoulders back knees.  She is on multiple medications including gabapentin, Voltaren gel, tramadol and Vicodin.  She is also on trazodone for difficulty sleeping.  She denies chest pain or shortness of breath.  She continues to smoke a half a pack a day.   Current Meds  Medication Sig   diclofenac Sodium (VOLTAREN) 1 % GEL Apply 4 g topically 4 (four) times daily.   esomeprazole (NEXIUM) 20 MG capsule Take 20 mg by mouth daily as needed.    gabapentin (NEURONTIN) 100 MG capsule Take 1 capsule (100 mg total) by mouth 3 (three) times daily.   lidocaine (LIDODERM) 5 % Place 1 patch onto the skin daily. Remove & Discard patch within 12 hours or as directed by MD   meclizine (ANTIVERT) 12.5 MG tablet Take 1 tablet (12.5 mg total) by mouth 3 (three) times daily as needed for dizziness.   nitroGLYCERIN (NITRODUR - DOSED IN MG/24 HR) 0.2 mg/hr patch Cut and apply 1/4 patch to most painful area q24h.    rosuvastatin (CRESTOR) 10 MG tablet Take 1 tablet (10 mg total) by mouth daily.   traZODone (DESYREL) 50 MG tablet Take 0.5-1 tablets (25-50 mg total) by mouth at bedtime as needed for sleep.     No Known Allergies  Social History   Socioeconomic History   Marital status: Single    Spouse name: Not on file   Number of children: Not on file   Years of education: Not on file   Highest education level: Not on file  Occupational History   Not on file  Tobacco Use   Smoking status: Every Day    Packs/day: 0.25    Years: 36.00    Pack years: 9.00    Types: Cigarettes   Smokeless tobacco: Never  Vaping Use   Vaping Use: Former  Substance and Sexual Activity   Alcohol use: Not Currently    Alcohol/week: 0.0 standard drinks   Drug use: No   Sexual activity: Yes    Birth control/protection: Post-menopausal    Comment: 1st intercourse 55 yo-More than 5 partners  Other Topics Concern   Not on file  Social History Narrative   Not on file   Social Determinants of Health   Financial Resource Strain: Not on file  Food Insecurity: Not on file  Transportation Needs: Not on file  Physical Activity: Not on file  Stress: Not on file  Social Connections: Not on file  Intimate  Partner Violence: Not on file     Review of Systems: General: negative for chills, fever, night sweats or weight changes.  Cardiovascular: negative for chest pain, dyspnea on exertion, edema, orthopnea, palpitations, paroxysmal nocturnal dyspnea or shortness of breath Dermatological: negative for rash Respiratory: negative for cough or wheezing Urologic: negative for hematuria Abdominal: negative for nausea, vomiting, diarrhea, bright red blood per rectum, melena, or hematemesis Neurologic: negative for visual changes, syncope, or dizziness All other systems reviewed and are otherwise negative except as noted above.    Blood pressure 120/80, pulse 84, height 5\' 6"  (1.676 m), weight 237 lb 3.2 oz (107.6 kg),  SpO2 95 %.  General appearance: alert and no distress Neck: no adenopathy, no carotid bruit, no JVD, supple, symmetrical, trachea midline, and thyroid not enlarged, symmetric, no tenderness/mass/nodules Lungs: clear to auscultation bilaterally Heart: regular rate and rhythm, S1, S2 normal, no murmur, click, rub or gallop Extremities: extremities normal, atraumatic, no cyanosis or edema Pulses: 2+ and symmetric Skin: Skin color, texture, turgor normal. No rashes or lesions Neurologic: Grossly normal  EKG sinus rhythm 84 without ST or T wave changes.  I personally reviewed this EKG.  ASSESSMENT AND PLAN:   PVC's (premature ventricular contractions) History of PVCs in the past by monitor back in 2012.  She is under a lot of stress lately as well as chronic pain and depression and has noticed increasing frequency of her PVCs.  I am going to get a 2-week Zio patch and empirically begin her on a low-dose beta-blocker which she has been on in the past.  Hyperlipidemia History of hyperlipidemia on low-dose statin therapy with lipid profile performed 01/28/2021 revealing total cholesterol 180, LDL 108 and HDL of 42.  I am going to get a coronary calcium score to help direct intensity of statin therapy.     Lorretta Harp MD FACP,FACC,FAHA, [facility] 02/11/2021 1:56 PM

## 2021-02-11 NOTE — Assessment & Plan Note (Signed)
History of PVCs in the past by monitor back in 2012.  She is under a lot of stress lately as well as chronic pain and depression and has noticed increasing frequency of her PVCs.  I am going to get a 2-week Zio patch and empirically begin her on a low-dose beta-blocker which she has been on in the past.

## 2021-02-11 NOTE — Assessment & Plan Note (Signed)
History of hyperlipidemia on low-dose statin therapy with lipid profile performed 01/28/2021 revealing total cholesterol 180, LDL 108 and HDL of 42.  I am going to get a coronary calcium score to help direct intensity of statin therapy.

## 2021-02-11 NOTE — Patient Instructions (Addendum)
Medication Instructions:   -Start taking metoprolol tartrate (lopressor) 12.5mg  twice daily.  *If you need a refill on your cardiac medications before your next appointment, please call your pharmacy*   Testing/Procedures: Dr. Gwenlyn Found has ordered a CT coronary calcium score. This test is done at [address]. This is $99 out of pocket.   Coronary CalciumScan A coronary calcium scan is an imaging test used to look for deposits of calcium and other fatty materials (plaques) in the inner lining of the blood vessels of the heart (coronary arteries). These deposits of calcium and plaques can partly clog and narrow the coronary arteries without producing any symptoms or warning signs. This puts a person at risk for a heart attack. This test can detect these deposits before symptoms develop. Tell a health care provider about: Any allergies you have. All medicines you are taking, including vitamins, herbs, eye drops, creams, and over-the-counter medicines. Any problems you or family members have had with anesthetic medicines. Any blood disorders you have. Any surgeries you have had. Any medical conditions you have. Whether you are pregnant or may be pregnant. What are the risks? Generally, this is a safe procedure. However, problems may occur, including: Harm to a pregnant woman and her unborn baby. This test involves the use of radiation. Radiation exposure can be dangerous to a pregnant woman and her unborn baby. If you are pregnant, you generally should not have this procedure done. Slight increase in the risk of cancer. This is because of the radiation involved in the test. What happens before the procedure? No preparation is needed for this procedure. What happens during the procedure? You will undress and remove any jewelry around your neck or chest. You will put on a hospital gown. Sticky electrodes will be placed on your chest. The electrodes will be connected to an  electrocardiogram (ECG) machine to record a tracing of the electrical activity of your heart. A CT scanner will take pictures of your heart. During this time, you will be asked to lie still and hold your breath for 2-3 seconds while a picture of your heart is being taken. The procedure may vary among health care providers and hospitals. What happens after the procedure? You can get dressed. You can return to your normal activities. It is up to you to get the results of your test. Ask your health care provider, or the department that is doing the test, when your results will be ready. Summary A coronary calcium scan is an imaging test used to look for deposits of calcium and other fatty materials (plaques) in the inner lining of the blood vessels of the heart (coronary arteries). Generally, this is a safe procedure. Tell your health care provider if you are pregnant or may be pregnant. No preparation is needed for this procedure. A CT scanner will take pictures of your heart. You can return to your normal activities after the scan is done. This information is not intended to replace advice given to you by your health care provider. Make sure you discuss any questions you have with your health care provider. Document Released: 09/23/2007 Document Revised: 02/14/2016 Document Reviewed: 02/14/2016 Elsevier Interactive Patient Education  2017 [employer] Term Monitor Instructions  Your physician has requested you wear a ZIO patch monitor for 14 days.  This is a single patch monitor. [employer] supplies one patch monitor per enrollment. Additional stickers are not available. Please do not apply patch if you will  be having a Nuclear Stress Test,  Echocardiogram, Cardiac CT, MRI, or Chest Xray during the period you would be wearing the  monitor. The patch cannot be worn during these tests. You cannot remove and re-apply the  ZIO XT patch monitor.  Your ZIO patch monitor will be mailed  3 day USPS to your address on file. It may take 3-5 days  to receive your monitor after you have been enrolled.  Once you have received your monitor, please review the enclosed instructions. Your monitor  has already been registered assigning a specific monitor serial # to you.  Billing and Patient Assistance Program Information  We have supplied [employer] with any of your insurance information on file for billing purposes. [employer] offers a sliding scale Patient Assistance Program for patients that do not have  insurance, or whose insurance does not completely cover the cost of the ZIO monitor.  You must apply for the Patient Assistance Program to qualify for this discounted rate.  To apply, please call [employer] at [phone], select option 4, select option 2, ask to apply for  Patient Assistance Program. Theodore Demark will ask your household income, and how many people  are in your household. They will quote your out-of-pocket cost based on that information.  [employer] will also be able to set up a 74-month, interest-free payment plan if needed.  Applying the monitor   Shave hair from upper left chest.  Hold abrader disc by orange tab. Rub abrader in 40 strokes over the upper left chest as  indicated in your monitor instructions.  Clean area with 4 enclosed alcohol pads. Let dry.  Apply patch as indicated in monitor instructions. Patch will be placed under collarbone on left  side of chest with arrow pointing upward.  Rub patch adhesive wings for 2 minutes. Remove white label marked "1". Remove the white  label marked "2". Rub patch adhesive wings for 2 additional minutes.  While looking in a mirror, press and release button in center of patch. A small green light will  flash 3-4 times. This will be your only indicator that the monitor has been turned on.  Do not shower for the first 24 hours. You may shower after the first 24 hours.  Press the button if you feel a symptom. You will hear a small  click. Record Date, Time and  Symptom in the Patient Logbook.  When you are ready to remove the patch, follow instructions on the last 2 pages of Patient  Logbook. Stick patch monitor onto the last page of Patient Logbook.  Place Patient Logbook in the blue and white box. Use locking tab on box and tape box closed  securely. The blue and white box has prepaid postage on it. Please place it in the mailbox as  soon as possible. Your physician should have your test results approximately 7 days after the  monitor has been mailed back to [facility].  Call [facility] at [phone] if you have questions regarding  your ZIO XT patch monitor. Call them immediately if you see an orange light blinking on your  monitor.  If your monitor falls off in less than 4 days, contact our Monitor department at [phone].  If your monitor becomes loose or falls off after 4 days call [employer] at [phone] for  suggestions on securing your monitor   Follow-Up: At [facility], you and your health needs are our priority.  As part of our continuing mission to provide you with exceptional  heart care, we have created designated Provider Care Teams.  These Care Teams include your primary Cardiologist (physician) and Advanced Practice Providers (APPs -  Physician Assistants and Nurse Practitioners) who all work together to provide you with the care you need, when you need it.  We recommend signing up for the patient portal called "MyChart".  Sign up information is provided on this After Visit Summary.  MyChart is used to connect with patients for Virtual Visits (Telemedicine).  Patients are able to view lab/test results, encounter notes, upcoming appointments, etc.  Non-urgent messages can be sent to your provider as well.   To learn more about what you can do with MyChart, go to [email or web address].    Your next appointment:   3 month(s)  The format for your next appointment:   In  Person  Provider:   Coletta Memos, FNP, Fabian Sharp, PA-C, Sande Rives, PA-C, Caron Presume, PA-C, Jory Sims, DNP, ANP, or Almyra Deforest, PA-C      Then, Dr. Quay Burow will plan to see you again in 6 month(s).

## 2021-02-11 NOTE — Progress Notes (Unsigned)
Enrolled patient for a 14 day Zio XT  monitor to be mailed to patients home  °

## 2021-02-14 DIAGNOSIS — I493 Ventricular premature depolarization: Secondary | ICD-10-CM

## 2021-02-22 ENCOUNTER — Ambulatory Visit: Payer: BC Managed Care – PPO | Admitting: Medical

## 2021-02-23 ENCOUNTER — Ambulatory Visit: Payer: BC Managed Care – PPO | Admitting: Medical

## 2021-02-25 ENCOUNTER — Ambulatory Visit: Payer: BC Managed Care – PPO | Admitting: Medical

## 2021-02-28 ENCOUNTER — Other Ambulatory Visit: Payer: Self-pay | Admitting: Medical

## 2021-03-03 ENCOUNTER — Other Ambulatory Visit: Payer: Self-pay | Admitting: Family Medicine

## 2021-03-04 ENCOUNTER — Other Ambulatory Visit: Payer: Self-pay | Admitting: Medical

## 2021-03-08 ENCOUNTER — Ambulatory Visit (INDEPENDENT_AMBULATORY_CARE_PROVIDER_SITE_OTHER)
Admission: RE | Admit: 2021-03-08 | Discharge: 2021-03-08 | Disposition: A | Discharge status: Home / Self Care | Payer: Self-pay | Source: Ambulatory Visit | Attending: Cardiovascular Disease | Admitting: Cardiovascular Disease

## 2021-03-08 ENCOUNTER — Other Ambulatory Visit: Payer: Self-pay

## 2021-03-08 DIAGNOSIS — I493 Ventricular premature depolarization: Secondary | ICD-10-CM | POA: Diagnosis not present

## 2021-03-08 DIAGNOSIS — E782 Mixed hyperlipidemia: Secondary | ICD-10-CM

## 2021-03-09 ENCOUNTER — Encounter: Payer: Self-pay | Admitting: Cardiovascular Disease

## 2021-03-09 ENCOUNTER — Encounter: Payer: Self-pay | Admitting: Family Medicine

## 2021-03-09 DIAGNOSIS — E7849 Other hyperlipidemia: Secondary | ICD-10-CM

## 2021-03-09 DIAGNOSIS — E782 Mixed hyperlipidemia: Secondary | ICD-10-CM

## 2021-03-09 MED ORDER — ROSUVASTATIN CALCIUM 40 MG PO TABS: 40.0000 mg | ORAL_TABLET | Freq: Every day | ORAL | 1 refills | Status: DC

## 2021-03-11 ENCOUNTER — Other Ambulatory Visit: Payer: Self-pay | Admitting: Family Medicine

## 2021-03-16 ENCOUNTER — Encounter: Payer: Self-pay | Admitting: Medical

## 2021-03-18 ENCOUNTER — Telehealth: Payer: Self-pay | Admitting: Medical

## 2021-03-18 MED ORDER — MECLIZINE HCL 12.5 MG PO TABS: ORAL_TABLET | ORAL | 0 refills | Status: DC

## 2021-03-18 NOTE — Telephone Encounter (Signed)
Chart opened to rx med, order lab, review chart, respond to my chart message or send message to staff member  

## 2021-04-05 ENCOUNTER — Other Ambulatory Visit: Payer: Self-pay | Admitting: Medical

## 2021-04-16 ENCOUNTER — Other Ambulatory Visit: Payer: Self-pay

## 2021-04-16 ENCOUNTER — Emergency Department (HOSPITAL_COMMUNITY): Payer: BC Managed Care – PPO

## 2021-04-16 ENCOUNTER — Emergency Department (HOSPITAL_COMMUNITY)
Admission: EM | Admit: 2021-04-16 | Discharge: 2021-04-16 | Disposition: A | Discharge status: Home / Self Care | Payer: BC Managed Care – PPO | Attending: Emergency Medicine | Admitting: Emergency Medicine

## 2021-04-16 ENCOUNTER — Encounter (HOSPITAL_COMMUNITY): Payer: Self-pay

## 2021-04-16 DIAGNOSIS — R4020 Unspecified coma: Secondary | ICD-10-CM | POA: Diagnosis not present

## 2021-04-16 DIAGNOSIS — Z79899 Other long term (current) drug therapy: Secondary | ICD-10-CM | POA: Diagnosis not present

## 2021-04-16 DIAGNOSIS — S299XXA Unspecified injury of thorax, initial encounter: Secondary | ICD-10-CM | POA: Diagnosis not present

## 2021-04-16 DIAGNOSIS — J939 Pneumothorax, unspecified: Secondary | ICD-10-CM | POA: Insufficient documentation

## 2021-04-16 DIAGNOSIS — S0990XA Unspecified injury of head, initial encounter: Secondary | ICD-10-CM | POA: Diagnosis not present

## 2021-04-16 DIAGNOSIS — I10 Essential (primary) hypertension: Secondary | ICD-10-CM | POA: Diagnosis not present

## 2021-04-16 DIAGNOSIS — R0789 Other chest pain: Secondary | ICD-10-CM | POA: Diagnosis not present

## 2021-04-16 DIAGNOSIS — Y92410 Unspecified street and highway as the place of occurrence of the external cause: Secondary | ICD-10-CM | POA: Insufficient documentation

## 2021-04-16 DIAGNOSIS — S270XXA Traumatic pneumothorax, initial encounter: Secondary | ICD-10-CM

## 2021-04-16 DIAGNOSIS — S2232XA Fracture of one rib, left side, initial encounter for closed fracture: Secondary | ICD-10-CM | POA: Diagnosis not present

## 2021-04-16 DIAGNOSIS — I700 Atherosclerosis of aorta: Secondary | ICD-10-CM | POA: Diagnosis not present

## 2021-04-16 DIAGNOSIS — S0003XA Contusion of scalp, initial encounter: Secondary | ICD-10-CM | POA: Insufficient documentation

## 2021-04-16 DIAGNOSIS — R609 Edema, unspecified: Secondary | ICD-10-CM | POA: Diagnosis not present

## 2021-04-16 MED ORDER — ACETAMINOPHEN 325 MG PO TABS
650.0000 mg | ORAL_TABLET | Freq: Once | ORAL | Status: AC
  Administered 2021-04-16: 650 mg via ORAL
  Filled 2021-04-16: qty 2

## 2021-04-16 MED ORDER — LIDOCAINE 5 % EX PTCH
2.0000 | MEDICATED_PATCH | CUTANEOUS | Status: DC
  Administered 2021-04-16: 2 via TRANSDERMAL
  Filled 2021-04-16: qty 2

## 2021-04-16 MED ORDER — OXYCODONE HCL 5 MG PO TABS
5.0000 mg | ORAL_TABLET | Freq: Once | ORAL | Status: AC
  Administered 2021-04-16: 5 mg via ORAL
  Filled 2021-04-16: qty 1

## 2021-04-16 MED ORDER — ACETAMINOPHEN 325 MG PO TABS: 650.0000 mg | ORAL_TABLET | Freq: Four times a day (QID) | ORAL | 0 refills | Status: AC | PRN

## 2021-04-16 MED ORDER — OXYCODONE HCL 5 MG PO TABS: 5.0000 mg | ORAL_TABLET | Freq: Four times a day (QID) | ORAL | 0 refills | Status: DC | PRN

## 2021-04-16 NOTE — ED Notes (Signed)
I instructed pt how to use incentive spirometer. Pt verbalized understanding and was able to properly do return demonstration of use of IS
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
## 2021-04-22 ENCOUNTER — Ambulatory Visit (INDEPENDENT_AMBULATORY_CARE_PROVIDER_SITE_OTHER): Payer: BC Managed Care – PPO | Admitting: Medical

## 2021-04-22 ENCOUNTER — Other Ambulatory Visit: Payer: Self-pay

## 2021-04-22 ENCOUNTER — Encounter: Payer: Self-pay | Admitting: Medical

## 2021-04-22 ENCOUNTER — Ambulatory Visit (HOSPITAL_BASED_OUTPATIENT_CLINIC_OR_DEPARTMENT_OTHER)
Admission: RE | Admit: 2021-04-22 | Discharge: 2021-04-22 | Disposition: A | Discharge status: Home / Self Care | Payer: BC Managed Care – PPO | Source: Ambulatory Visit | Attending: Medical | Admitting: Medical

## 2021-04-22 VITALS — BP 139/80 | HR 72 | Resp 20 | Ht 66.0 in | Wt 243.6 lb

## 2021-04-22 DIAGNOSIS — S2232XD Fracture of one rib, left side, subsequent encounter for fracture with routine healing: Secondary | ICD-10-CM | POA: Diagnosis not present

## 2021-04-22 DIAGNOSIS — S0003XD Contusion of scalp, subsequent encounter: Secondary | ICD-10-CM

## 2021-04-22 DIAGNOSIS — J939 Pneumothorax, unspecified: Secondary | ICD-10-CM

## 2021-04-22 MED ORDER — OXYCODONE HCL 5 MG PO TABS: 5.0000 mg | ORAL_TABLET | Freq: Four times a day (QID) | ORAL | 0 refills | Status: DC | PRN
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
## 2021-05-09 ENCOUNTER — Ambulatory Visit (INDEPENDENT_AMBULATORY_CARE_PROVIDER_SITE_OTHER): Payer: BC Managed Care – PPO | Admitting: Medical

## 2021-05-09 ENCOUNTER — Ambulatory Visit (HOSPITAL_BASED_OUTPATIENT_CLINIC_OR_DEPARTMENT_OTHER)
Admission: RE | Admit: 2021-05-09 | Discharge: 2021-05-09 | Disposition: A | Discharge status: Home / Self Care | Payer: BC Managed Care – PPO | Source: Ambulatory Visit | Attending: Medical | Admitting: Medical

## 2021-05-09 ENCOUNTER — Other Ambulatory Visit: Payer: Self-pay

## 2021-05-09 VITALS — BP 130/70 | HR 68 | Resp 18 | Ht 66.0 in | Wt 241.0 lb

## 2021-05-09 DIAGNOSIS — R051 Acute cough: Secondary | ICD-10-CM | POA: Insufficient documentation

## 2021-05-09 DIAGNOSIS — R059 Cough, unspecified: Secondary | ICD-10-CM | POA: Diagnosis not present

## 2021-05-09 DIAGNOSIS — S2232XD Fracture of one rib, left side, subsequent encounter for fracture with routine healing: Secondary | ICD-10-CM | POA: Diagnosis not present

## 2021-05-09 DIAGNOSIS — J9811 Atelectasis: Secondary | ICD-10-CM | POA: Diagnosis not present

## 2021-05-09 MED ORDER — HYDROCODONE-ACETAMINOPHEN 5-325 MG PO TABS: 1.0000 | ORAL_TABLET | Freq: Four times a day (QID) | ORAL | 0 refills | Status: DC | PRN

## 2021-05-09 MED ORDER — AZITHROMYCIN 250 MG PO TABS: ORAL_TABLET | ORAL | 0 refills | Status: AC
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
## 2021-05-09 NOTE — Addendum Note (Signed)
Addended by: Anabel Halon on: 05/09/2021 07:06 PM   Modules accepted: Orders
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
## 2021-05-16 ENCOUNTER — Ambulatory Visit: Payer: BC Managed Care – PPO | Admitting: General Practice

## 2021-05-16 NOTE — Telephone Encounter (Signed)
Tried to call pt and phone just goes straight to VM, unable to contact pt. Will cancel appt since unable to contact

## 2021-05-30 ENCOUNTER — Ambulatory Visit (INDEPENDENT_AMBULATORY_CARE_PROVIDER_SITE_OTHER): Payer: BC Managed Care – PPO | Admitting: Medical

## 2021-05-30 VITALS — BP 127/71 | HR 69 | Temp 98.2°F | Resp 18 | Ht 66.0 in | Wt 236.0 lb

## 2021-05-30 DIAGNOSIS — S2232XD Fracture of one rib, left side, subsequent encounter for fracture with routine healing: Secondary | ICD-10-CM | POA: Diagnosis not present

## 2021-05-30 DIAGNOSIS — R0781 Pleurodynia: Secondary | ICD-10-CM
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
## 2021-06-02 ENCOUNTER — Other Ambulatory Visit: Payer: Self-pay

## 2021-06-02 DIAGNOSIS — E782 Mixed hyperlipidemia: Secondary | ICD-10-CM

## 2021-08-24 ENCOUNTER — Encounter: Payer: Self-pay | Admitting: Internal Medicine

## 2021-08-25 ENCOUNTER — Ambulatory Visit: Payer: BC Managed Care – PPO | Admitting: Nurse Practitioner

## 2021-08-30 ENCOUNTER — Ambulatory Visit (INDEPENDENT_AMBULATORY_CARE_PROVIDER_SITE_OTHER): Payer: BC Managed Care – PPO | Admitting: Nurse Practitioner

## 2021-08-30 ENCOUNTER — Encounter: Payer: Self-pay | Admitting: Nurse Practitioner

## 2021-08-30 ENCOUNTER — Ambulatory Visit: Payer: BC Managed Care – PPO | Admitting: Nurse Practitioner

## 2021-08-30 VITALS — BP 122/82 | Ht 66.0 in | Wt 237.0 lb

## 2021-08-30 DIAGNOSIS — Z780 Asymptomatic menopausal state: Secondary | ICD-10-CM | POA: Diagnosis not present

## 2021-08-30 DIAGNOSIS — Z01419 Encounter for gynecological examination (general) (routine) without abnormal findings: Secondary | ICD-10-CM
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
## 2021-09-04 ENCOUNTER — Other Ambulatory Visit: Payer: Self-pay | Admitting: Cardiovascular Disease

## 2021-09-04 DIAGNOSIS — E7849 Other hyperlipidemia: Secondary | ICD-10-CM

## 2021-09-19 ENCOUNTER — Ambulatory Visit: Payer: BC Managed Care – PPO | Admitting: Nurse Practitioner

## 2021-10-27 DIAGNOSIS — H5203 Hypermetropia, bilateral: Secondary | ICD-10-CM | POA: Diagnosis not present

## 2021-11-16 ENCOUNTER — Encounter (INDEPENDENT_AMBULATORY_CARE_PROVIDER_SITE_OTHER): Payer: Self-pay

## 2022-01-26 DIAGNOSIS — J069 Acute upper respiratory infection, unspecified: Secondary | ICD-10-CM | POA: Diagnosis not present

## 2022-01-27 DIAGNOSIS — Z1231 Encounter for screening mammogram for malignant neoplasm of breast: Secondary | ICD-10-CM | POA: Diagnosis not present

## 2022-03-18 ENCOUNTER — Other Ambulatory Visit: Payer: Self-pay | Admitting: Cardiovascular Disease

## 2022-03-18 DIAGNOSIS — E7849 Other hyperlipidemia: Secondary | ICD-10-CM

## 2022-03-21 ENCOUNTER — Other Ambulatory Visit: Payer: Self-pay | Admitting: Cardiovascular Disease

## 2022-03-21 DIAGNOSIS — E7849 Other hyperlipidemia: Secondary | ICD-10-CM

## 2022-03-22 ENCOUNTER — Encounter: Payer: Self-pay | Admitting: Internal Medicine

## 2022-03-25 DIAGNOSIS — H6693 Otitis media, unspecified, bilateral: Secondary | ICD-10-CM | POA: Diagnosis not present

## 2022-03-25 DIAGNOSIS — J069 Acute upper respiratory infection, unspecified: Secondary | ICD-10-CM | POA: Diagnosis not present

## 2022-03-25 DIAGNOSIS — B349 Viral infection, unspecified: Secondary | ICD-10-CM | POA: Diagnosis not present

## 2022-03-25 DIAGNOSIS — R051 Acute cough: Secondary | ICD-10-CM | POA: Diagnosis not present

## 2022-03-28 DIAGNOSIS — J069 Acute upper respiratory infection, unspecified: Secondary | ICD-10-CM | POA: Diagnosis not present

## 2022-03-31 ENCOUNTER — Other Ambulatory Visit: Payer: Self-pay

## 2022-03-31 DIAGNOSIS — E7849 Other hyperlipidemia: Secondary | ICD-10-CM

## 2022-03-31 MED ORDER — ROSUVASTATIN CALCIUM 40 MG PO TABS: ORAL_TABLET | ORAL | 0 refills | Status: DC

## 2022-04-05 DIAGNOSIS — K219 Gastro-esophageal reflux disease without esophagitis: Secondary | ICD-10-CM | POA: Diagnosis not present

## 2022-04-05 DIAGNOSIS — R0789 Other chest pain: Secondary | ICD-10-CM | POA: Diagnosis not present

## 2022-04-22 ENCOUNTER — Other Ambulatory Visit: Payer: Self-pay | Admitting: Cardiovascular Disease

## 2022-04-22 DIAGNOSIS — E7849 Other hyperlipidemia: Secondary | ICD-10-CM

## 2022-04-28 ENCOUNTER — Ambulatory Visit (INDEPENDENT_AMBULATORY_CARE_PROVIDER_SITE_OTHER): Payer: BC Managed Care – PPO | Admitting: Medical

## 2022-04-28 ENCOUNTER — Emergency Department (HOSPITAL_BASED_OUTPATIENT_CLINIC_OR_DEPARTMENT_OTHER)
Admission: EM | Admit: 2022-04-28 | Discharge: 2022-04-28 | Disposition: A | Discharge status: Home / Self Care | Payer: BC Managed Care – PPO | Attending: Emergency Medicine | Admitting: Emergency Medicine

## 2022-04-28 ENCOUNTER — Emergency Department (HOSPITAL_BASED_OUTPATIENT_CLINIC_OR_DEPARTMENT_OTHER): Payer: BC Managed Care – PPO

## 2022-04-28 ENCOUNTER — Other Ambulatory Visit: Payer: Self-pay

## 2022-04-28 VITALS — BP 146/80 | HR 90 | Temp 98.6°F | Resp 18 | Ht 66.0 in | Wt 234.0 lb

## 2022-04-28 DIAGNOSIS — J069 Acute upper respiratory infection, unspecified: Secondary | ICD-10-CM | POA: Insufficient documentation

## 2022-04-28 DIAGNOSIS — R059 Cough, unspecified: Secondary | ICD-10-CM

## 2022-04-28 DIAGNOSIS — R079 Chest pain, unspecified: Secondary | ICD-10-CM | POA: Diagnosis not present

## 2022-04-28 DIAGNOSIS — F17210 Nicotine dependence, cigarettes, uncomplicated: Secondary | ICD-10-CM | POA: Insufficient documentation

## 2022-04-28 DIAGNOSIS — R0789 Other chest pain: Secondary | ICD-10-CM | POA: Insufficient documentation

## 2022-04-28 DIAGNOSIS — J45909 Unspecified asthma, uncomplicated: Secondary | ICD-10-CM | POA: Insufficient documentation

## 2022-04-28 LAB — BASIC METABOLIC PANEL
Anion gap: 11 (ref 5–15)
BUN: 15 mg/dL (ref 6–20)
CO2: 25 mmol/L (ref 22–32)
Calcium: 9.6 mg/dL (ref 8.9–10.3)
Chloride: 104 mmol/L (ref 98–111)
Creatinine, Ser: 0.72 mg/dL (ref 0.44–1.00)
GFR, Estimated: >60 mL/min (ref >60)
Glucose, Bld: 100 mg/dL — ABNORMAL HIGH (ref 70–99)
Potassium: 4.3 mmol/L (ref 3.5–5.1)
Sodium: 140 mmol/L (ref 135–145)

## 2022-04-28 LAB — CBC
HCT: 41.9 % (ref 36.0–46.0)
Hemoglobin: 13.6 g/dL (ref 12.0–15.0)
MCH: 28.3 pg (ref 26.0–34.0)
MCHC: 32.5 g/dL (ref 30.0–36.0)
MCV: 87.3 fL (ref 80.0–100.0)
Platelets: 369 10*3/uL (ref 150–400)
RBC: 4.8 MIL/uL (ref 3.87–5.11)
RDW: 14.6 % (ref 11.5–15.5)
WBC: 12 10*3/uL — ABNORMAL HIGH (ref 4.0–10.5)
nRBC: 0 % (ref 0.0–0.2)

## 2022-04-28 LAB — POCT INFLUENZA A/B
Influenza A, POC: NEGATIVE
Influenza B, POC: NEGATIVE

## 2022-04-28 LAB — TROPONIN I (HIGH SENSITIVITY)
Troponin I (High Sensitivity): 3 ng/L (ref <18)
Troponin I (High Sensitivity): <2 ng/L (ref <18)

## 2022-04-28 LAB — POC COVID19 BINAXNOW: SARS Coronavirus 2 Ag: NEGATIVE

## 2022-04-28 LAB — PREGNANCY, URINE: Preg Test, Ur: NEGATIVE

## 2022-04-28 MED ORDER — IPRATROPIUM-ALBUTEROL 0.5-2.5 (3) MG/3ML IN SOLN
3.0000 mL | Freq: Once | RESPIRATORY_TRACT | Status: AC
  Administered 2022-04-28: 3 mL via RESPIRATORY_TRACT
  Filled 2022-04-28: qty 3

## 2022-04-28 MED ORDER — DOXYCYCLINE HYCLATE 100 MG PO CAPS: 100.0000 mg | ORAL_CAPSULE | Freq: Two times a day (BID) | ORAL | 0 refills | Status: AC

## 2022-04-28 MED ORDER — PREDNISONE 10 MG (21) PO TBPK: ORAL_TABLET | Freq: Every day | ORAL | 0 refills | Status: DC

## 2022-04-28 NOTE — Patient Instructions (Addendum)
Patient is in today summarizing that 1 month ago she had viral/uri/flu like signs and symptoms and she was given Augmentin and promethazine.  She states that she did recover from that but then 1 day ago developed similar symptoms but milder.  Describing more upper respiratory infection signs and symptoms this time.  Flu and COVID test was negative today.  Some productive mucus when she coughs.  However at the onset of the interview she started off saying that it felt like somebody was sitting on her chest.  Later in the interview she brought this up again and on discussion states that since this morning she has had a persistent pressure sensation.  Yesterday she noticed some right side chest pain.  She has no nausea, vomiting jaw pain or shoulder pain.  On review of her chart she does have a history of smoking for 20 years.  On review of her coronary calcium score it showed a 78 which was 17 percentile for age.  She does have history of PVCs and EKG today shows a range of 76 with short PR interval.  Looks similar to previous EKGs.  Counseled the patient that with her description of sensation of someone sitting on her chest and her risk factors it is best to have her evaluated in the emergency department today.  Will call down to the emergency department physician.  Follow-up date to be determined after emergency department evaluation.

## 2022-04-28 NOTE — Discharge Instructions (Addendum)
It was a pleasure caring for you today in the emergency department.  Please return to the emergency department for any worsening or worrisome symptoms.  Please continue to cut back on tobacco use

## 2022-04-28 NOTE — ED Provider Notes (Signed)
[facility] AT [facility] Provider Note  CSN: [id number] Arrival date & time: 04/28/22 1528  Chief Complaint(s) Generalized Body Aches and Chest Pain  HPI Jasmin Stewart is a 57 y.o. female with past medical history as below, significant for HLD, PVC, asthma, fibroid who presents to the ED with complaint of bodyaches, chest pressure, cough.  She was seen primary care earlier today, sent to the ED for evaluation of her chest pain.  Patient reports cough, chest pressure over the past 24-36 hrs.  Present cough intermittently with brown sputum.  No fevers or chills, no nausea or vomiting.  No change to p.o. intake.  No change in bowel or bladder function.  She tried Mucinex with mild relief. No sig dib. No chest pain, more-so tightness/pressure. Midsternum up to her neck.   Past Medical History Past Medical History:  Diagnosis Date   Abdominal hernia    Anemia    Asthma    Fibroid, uterine    never had surgery   History of blood transfusion 1991; 1995   related to childbirth   History of chicken pox    Age 44   Hypercholesteremia    Hypoglycemia    Hypokalemia    Hypotension    Pneumonia 2013,2014, 2018   PVC (premature ventricular contraction)    Ulcer of esophagus    Patient Active Problem List   Diagnosis Date Noted   Subacromial bursitis of right shoulder joint 01/11/2021   Cervical radiculopathy 01/03/2021   Enteritis 10/24/2019   Calcific tendinitis of left shoulder 12/02/2018   Hyperlipidemia 04/19/2018   Ventral incisional hernia 06/05/2017   PVC's (premature ventricular contractions) 06/09/2016   Home Medication(s) Prior to Admission medications   Medication Sig Start Date End Date Taking? Authorizing Provider  acetaminophen (TYLENOL) 325 MG tablet Take 2 tablets (650 mg total) by mouth every 6 (six) hours as needed for up to 30 doses for moderate pain or mild pain. 04/16/21   Wyvonnia Dusky, MD  diclofenac Sodium (VOLTAREN) 1 % GEL  Apply 4 g topically 4 (four) times daily. 01/08/21   Malvin Johns, MD  doxycycline (VIBRAMYCIN) 100 MG capsule Take 1 capsule (100 mg total) by mouth 2 (two) times daily for 5 days. 04/28/22 05/03/22 Yes Wynona Dove A, DO  esomeprazole (NEXIUM) 20 MG capsule Take 20 mg by mouth daily as needed.     [provider]  lidocaine (LIDODERM) 5 % Place 1 patch onto the skin daily. Remove & Discard patch within 12 hours or as directed by MD 01/08/21   Malvin Johns, MD  meclizine (ANTIVERT) 12.5 MG tablet TAKE 1 TABLET(12.5 MG) BY MOUTH THREE TIMES DAILY AS NEEDED FOR DIZZINESS 03/18/21   Saguier, Percell Miller, PA-C  metoprolol tartrate (LOPRESSOR) 25 MG tablet Take 0.5 tablets (12.5 mg total) by mouth 2 (two) times daily. 02/11/21   Lorretta Harp, MD  nitroGLYCERIN (NITRODUR - DOSED IN MG/24 HR) 0.2 mg/hr patch CUT AND APPLY 1/4 PATCH TO MOIST PAINFUL AREA EVERY 24 HOURS 03/07/21   Rosemarie Ax, MD  predniSONE (STERAPRED UNI-PAK 21 TAB) 10 MG (21) TBPK tablet Take by mouth daily. Take 6 tabs by mouth daily  for 2 days, then 5 tabs for 2 days, then 4 tabs for 2 days, then 3 tabs for 2 days, 2 tabs for 2 days, then 1 tab by mouth daily for 2 days 04/28/22  Yes Wynona Dove A, DO  rosuvastatin (CRESTOR) 40 MG tablet TAKE 1 TABLET BY  MOUTH DAILY 04/24/22   Lorretta Harp, MD                                                                                                                                    Past Surgical History Past Surgical History:  Procedure Laterality Date   ABDOMINAL HERNIA REPAIR  2007; 06/05/2017   Suprapubic, Surgery 2007 with mesh VHR w/mesh; OPEN VENTRAL HERNIA REPAIR WITH MESH   CESAREAN SECTION  1991; 1995; 1998   ESOPHAGOGASTRODUODENOSCOPY  01/2017   HERNIA REPAIR  2007; 06/05/2017   INSERTION OF MESH N/A 06/05/2017   Procedure: INSERTION OF MESH;  Surgeon: Donnie Mesa, MD;  Location: [facility];  Service: General;  Laterality: N/A;   VENTRAL HERNIA REPAIR N/A 06/05/2017    Procedure: OPEN VENTRAL HERNIA REPAIR WITH MESH;  Surgeon: Donnie Mesa, MD;  Location: [facility];  Service: General;  Laterality: N/A;   Family History Family History  Problem Relation Age of Onset   Thyroid disease Mother    Atrial fibrillation Mother    Hyperlipidemia Mother    Hyperlipidemia Sister    Breast cancer Sister    Hyperlipidemia Brother    Thyroid disease Maternal Grandmother    Glaucoma Maternal Grandmother    Cataracts Maternal Grandmother    Esophageal cancer Maternal Grandfather    Hypertension Maternal Uncle    Obesity Maternal Uncle    Allergies Son        Hay Fever   Healthy Daughter    Colon cancer Neg Hx    Colon polyps Neg Hx    Stomach cancer Neg Hx    Rectal cancer Neg Hx     Social History Social History   Tobacco Use   Smoking status: Every Day    Packs/day: 0.25    Years: 36.00    Total pack years: 9.00    Types: Cigarettes   Smokeless tobacco: Never  Vaping Use   Vaping Use: Former  Substance Use Topics   Alcohol use: Not Currently    Alcohol/week: 0.0 standard drinks of alcohol   Drug use: No   Allergies Patient has no known allergies.  Review of Systems Review of Systems  Constitutional:  Positive for fatigue. Negative for chills and fever.  HENT:  Negative for facial swelling and trouble swallowing.   Eyes:  Negative for photophobia and visual disturbance.  Respiratory:  Positive for cough and chest tightness. Negative for shortness of breath.   Cardiovascular:  Negative for chest pain and palpitations.  Gastrointestinal:  Negative for abdominal pain, nausea and vomiting.  Endocrine: Negative for polydipsia and polyuria.  Genitourinary:  Negative for difficulty urinating and hematuria.  Musculoskeletal:  Positive for arthralgias. Negative for gait problem and joint swelling.  Skin:  Negative for pallor and rash.  Neurological:  Negative for syncope and headaches.  Psychiatric/Behavioral:  Negative for agitation and confusion.      Physical Exam Vital Signs  I have reviewed the triage vital signs BP 127/63   Pulse 75   Temp 98.7 F (37.1 C) (Oral)   Resp 16   LMP 08/22/2017 Comment: patient has some spotting last spotting was in January   SpO2 96%  Physical Exam Vitals and nursing note reviewed.  Constitutional:      General: She is not in acute distress.    Appearance: Normal appearance. She is well-developed. She is not ill-appearing or diaphoretic.  HENT:     Head: Normocephalic and atraumatic.     Right Ear: External ear normal.     Left Ear: External ear normal.     Nose: Nose normal.     Mouth/Throat:     Mouth: Mucous membranes are moist.  Eyes:     General: No scleral icterus.       Right eye: No discharge.        Left eye: No discharge.  Cardiovascular:     Rate and Rhythm: Normal rate and regular rhythm.     Pulses: Normal pulses.     Heart sounds: Normal heart sounds.     No S3 or S4 sounds.  Pulmonary:     Effort: Pulmonary effort is normal. No respiratory distress.     Breath sounds: Normal breath sounds. No decreased breath sounds or rhonchi.  Abdominal:     General: Abdomen is flat.     Palpations: Abdomen is soft.     Tenderness: There is no abdominal tenderness. There is no guarding or rebound.  Musculoskeletal:        General: Normal range of motion.     Cervical back: Normal range of motion.     Right lower leg: No edema.     Left lower leg: No edema.  Skin:    General: Skin is warm and dry.     Capillary Refill: Capillary refill takes less than 2 seconds.  Neurological:     Mental Status: She is alert.  Psychiatric:        Mood and Affect: Mood normal.        Behavior: Behavior normal.     ED Results and Treatments Labs (all labs ordered are listed, but only abnormal results are displayed) Labs Reviewed  BASIC METABOLIC PANEL - Abnormal; Notable for the following components:      Result Value   Glucose, Bld 100 (*)    All other components within normal  limits  CBC - Abnormal; Notable for the following components:   WBC 12.0 (*)    All other components within normal limits  PREGNANCY, URINE  TROPONIN I (HIGH SENSITIVITY)  TROPONIN I (HIGH SENSITIVITY)                                                                                                                          Radiology DG Chest 2 View  Result Date: 04/28/2022 CLINICAL DATA:  Chest pain. EXAM: CHEST - 2 VIEW COMPARISON:  05/09/2021. FINDINGS: The  heart size and mediastinal contours are within normal limits. Both lungs are clear. No visible pleural effusions or pneumothorax. No acute osseous abnormality. IMPRESSION: No active cardiopulmonary disease. Electronically Signed   By: Margaretha Sheffield M.D.   On: 04/28/2022 16:00    Pertinent labs & imaging results that were available during my care of the patient were reviewed by me and considered in my medical decision making (see MDM for details).  Medications Ordered in ED Medications  ipratropium-albuterol (DUONEB) 0.5-2.5 (3) MG/3ML nebulizer solution 3 mL (3 mLs Nebulization Given 04/28/22 1757)                                                                                                                                     Procedures Procedures  (including critical care time)  Medical Decision Making / ED Course   MDM:  FOSTER SONNIER is a 57 y.o. female with past medical history as below, significant for HLD, PVC, asthma, fibroid who presents to the ED with complaint of bodyaches, chest pressure, cough.. The complaint involves an extensive differential diagnosis and also carries with it a high risk of complications and morbidity.  Serious etiology was considered. Ddx includes but is not limited to: Differential includes all life-threatening causes for chest pain. This includes but is not exclusive to acute coronary syndrome, aortic dissection, pulmonary embolism, cardiac tamponade, community-acquired pneumonia, pericarditis,  musculoskeletal chest wall pain, etc.   On initial assessment the patient is: Resting comfortably, no acute distress, breathing comfortably on ambient air. Vital signs and nursing notes were reviewed     Patient with URI symptoms with chest pressure, tightness.  Will try nebulized breathing treatment given her chronic history of tobacco use.  She has no wheezing or significant dyspnea on exam.  Will start patient on steroids, Doxy for home for mucolysis.  She has promethazine that was prescribed previously that she can take at home for cough.  She has albuterol inhaler at home and does not need a refill.  She refrain from tobacco use.  Discussed other supportive care measures at home  Patient does report Somewhat better after DuoNeb.  She is breathing comfortably on ambient air, no stridor or wheezing.  No conversational dyspnea.  Stable for discharge and close o/ patient follow-up  The patient's chest pain is not suggestive of pulmonary embolus, cardiac ischemia, aortic dissection, pericarditis, myocarditis, pulmonary embolism, pneumothorax, pneumonia, Zoster, or esophageal perforation, or other serious etiology.  Historically not abrupt in onset, tearing or ripping, pulses symmetric. EKG nonspecific for ischemia/infarction. No dysrhythmias, brugada, WPW, prolonged QT noted.   Troponin negative x2. CXR reviewed. Labs without demonstration of acute pathology unless otherwise noted above. Low HEART Score: 0-3 points (0.9-1.7% risk of MACE).  Given the extremely low risk of these diagnoses further testing and evaluation for these possibilities does not appear to be indicated at this time. Patient in no distress and overall condition improved here  in the ED. Detailed discussions were had with the patient regarding current findings, and need for close f/u with PCP or on call doctor. The patient has been instructed to return immediately if the symptoms worsen in any way for re-evaluation. Patient  verbalized understanding and is in agreement with current care plan. All questions answered prior to discharge.   Additional history obtained: -Additional history obtained from na -External records from outside source obtained and reviewed including: Chart review including previous notes, labs, imaging, consultation notes including primary care documentation, prior labs and imaging, home medications, prior ED visit   Lab Tests: -I ordered, reviewed, and interpreted labs.   The pertinent results include:   Labs Reviewed  BASIC METABOLIC PANEL - Abnormal; Notable for the following components:      Result Value   Glucose, Bld 100 (*)    All other components within normal limits  CBC - Abnormal; Notable for the following components:   WBC 12.0 (*)    All other components within normal limits  PREGNANCY, URINE  TROPONIN I (HIGH SENSITIVITY)  TROPONIN I (HIGH SENSITIVITY)    Notable for stable  EKG   EKG Interpretation  Date/Time:  Friday April 28 2022 15:36:04 EST Ventricular Rate:  75 PR Interval:  112 QRS Duration: 95 QT Interval:  387 QTC Calculation: 433 R Axis:   60 Text Interpretation: Sinus rhythm Borderline short PR interval Abnormal R-wave progression, early transition Borderline T wave abnormalities no stemi similar to prior Confirmed by Wynona Dove ([id number]) on 04/28/2022 5:26:21 PM         Imaging Studies ordered: I ordered imaging studies including CXR I independently visualized the following imaging with scope of interpretation limited to determining acute life threatening conditions related to emergency care: CXR w/o overt pna or ptx I independently visualized and interpreted imaging. I agree with the radiologist interpretation   Medicines ordered and prescription drug management: Meds ordered this encounter  Medications   ipratropium-albuterol (DUONEB) 0.5-2.5 (3) MG/3ML nebulizer solution 3 mL   predniSONE (STERAPRED UNI-PAK 21 TAB) 10 MG (21) TBPK  tablet    Sig: Take by mouth daily. Take 6 tabs by mouth daily  for 2 days, then 5 tabs for 2 days, then 4 tabs for 2 days, then 3 tabs for 2 days, 2 tabs for 2 days, then 1 tab by mouth daily for 2 days    Dispense:  42 tablet    Refill:  0   doxycycline (VIBRAMYCIN) 100 MG capsule    Sig: Take 1 capsule (100 mg total) by mouth 2 (two) times daily for 5 days.    Dispense:  10 capsule    Refill:  0    -I have reviewed the patients home medicines and have made adjustments as needed   Consultations Obtained: na   Cardiac Monitoring: The patient was maintained on a cardiac monitor.  I personally viewed and interpreted the cardiac monitored which showed an underlying rhythm of: nsr  Social Determinants of Health:  Diagnosis or treatment significantly limited by social determinants of health: current smoker and obesity Counseled patient for approximately 3 minutes regarding smoking cessation. Discussed risks of smoking and how they applied and affected their visit here today. Patient not ready to quit at this time, however will follow up with their primary doctor when they are.   CPT code: [id number]: intermediate counseling for smoking cessation     Reevaluation: After the interventions noted above, I reevaluated the patient and found that they have  improved  Co morbidities that complicate the patient evaluation  Past Medical History:  Diagnosis Date   Abdominal hernia    Anemia    Asthma    Fibroid, uterine    never had surgery   History of blood transfusion 1991; 1995   related to childbirth   History of chicken pox    Age 30   Hypercholesteremia    Hypoglycemia    Hypokalemia    Hypotension    Pneumonia 2013,2014, 2018   PVC (premature ventricular contraction)    Ulcer of esophagus       Dispostion: Disposition decision including need for hospitalization was considered, and patient discharged from emergency department.    Final Clinical Impression(s) / ED  Diagnoses Final diagnoses:  Atypical chest pain  URI with cough and congestion     This chart was dictated using voice recognition software.  Despite best efforts to proofread,  errors can occur which can change the documentation meaning.    Jeanell Sparrow, DO 04/28/22 1904

## 2022-04-28 NOTE — Progress Notes (Signed)
Subjective:    Patient ID: Jasmin Stewart, female    DOB: [birth year], 57 y.o.   MRN: [id number]  HPI Pt in reporting some symptoms of upper chest discomfort. He states feels like has sensation as if someone is sitting on chest. She got this sensation since this morning . States yesterday had some chest pain on rt side. No wheezing or shortness of breath.  Pt tells me pressure sensation since this morning. Clarifies again like pressure     States a month ago had uri signs and symptoms and tested negative for the flu . She finished augmenin an phenergan dm. Pt states eventually she did get better. She tested negative for covid back then.   Pt states she got coughing up some light tan brown colored mucus with past 24 hours with cough, congestion and runny nose for as well. Today flu and covid test negative.   Pt flu and covid test was negative.  Pt does not have fever.  On review pt still is smoking. 20 years per pt. Hx of pvcs   IMPRESSION: Coronary calcium score of 78. This was 95 percentile for age-, race-, and sex-matched controls.   The 10-year ASCVD risk score (Arnett DK, et al., 2019) is: 10.7%   Values used to calculate the score:     Age: 88 years     Sex: Female     Is Non-Hispanic African American: Yes     Diabetic: No     Tobacco smoker: Yes     Systolic Blood Pressure: 867 mmHg     Is BP treated: No     HDL Cholesterol: 42.2 mg/dL     Total Cholesterol: 180 mg/dL   Review of Systems  Constitutional:  Negative for chills, fatigue and fever.  HENT:  Negative for congestion and ear discharge.   Respiratory:  Positive for cough. Negative for chest tightness, shortness of breath and wheezing.   Cardiovascular:  Negative for chest pain and palpitations.  Gastrointestinal:  Negative for abdominal pain.  Musculoskeletal:  Negative for back pain and joint swelling.  Neurological:  Negative for dizziness and headaches.  Hematological:  Negative for adenopathy. Does not  bruise/bleed easily.  Psychiatric/Behavioral:  Negative for behavioral problems and confusion.     Past Medical History:  Diagnosis Date   Abdominal hernia    Anemia    Asthma    Fibroid, uterine    never had surgery   History of blood transfusion 1991; 1995   related to childbirth   History of chicken pox    Age 44   Hypercholesteremia    Hypoglycemia    Hypokalemia    Hypotension    Pneumonia 2013,2014, 2018   PVC (premature ventricular contraction)    Ulcer of esophagus      Social History   Socioeconomic History   Marital status: Single    Spouse name: Not on file   Number of children: Not on file   Years of education: Not on file   Highest education level: Not on file  Occupational History   Not on file  Tobacco Use   Smoking status: Every Day    Packs/day: 0.25    Years: 36.00    Total pack years: 9.00    Types: Cigarettes   Smokeless tobacco: Never  Vaping Use   Vaping Use: Former  Substance and Sexual Activity   Alcohol use: Not Currently    Alcohol/week: 0.0 standard drinks of alcohol   Drug use:  No   Sexual activity: Yes    Birth control/protection: Post-menopausal    Comment: 1st intercourse 57 yo-More than 5 partners  Other Topics Concern   Not on file  Social History Narrative   Not on file   Social Determinants of Health   Financial Resource Strain: Not on file  Food Insecurity: Not on file  Transportation Needs: Not on file  Physical Activity: Not on file  Stress: Not on file  Social Connections: Not on file  Intimate Partner Violence: Not on file    Past Surgical History:  Procedure Laterality Date   ABDOMINAL HERNIA REPAIR  2007; 06/05/2017   Suprapubic, Surgery 2007 with mesh VHR w/mesh; OPEN VENTRAL HERNIA REPAIR WITH [place]; 1995; 1998   ESOPHAGOGASTRODUODENOSCOPY  01/2017   HERNIA REPAIR  2007; 06/05/2017   INSERTION OF MESH N/A 06/05/2017   Procedure: INSERTION OF MESH;  Surgeon: Donnie Mesa, MD;   Location: [facility];  Service: General;  Laterality: N/A;   VENTRAL HERNIA REPAIR N/A 06/05/2017   Procedure: OPEN VENTRAL HERNIA REPAIR WITH MESH;  Surgeon: Donnie Mesa, MD;  Location: [facility];  Service: General;  Laterality: N/A;    Family History  Problem Relation Age of Onset   Thyroid disease Mother    Atrial fibrillation Mother    Hyperlipidemia Mother    Hyperlipidemia Sister    Breast cancer Sister    Hyperlipidemia Brother    Thyroid disease Maternal Grandmother    Glaucoma Maternal Grandmother    Cataracts Maternal Grandmother    Esophageal cancer Maternal Grandfather    Hypertension Maternal Uncle    Obesity Maternal Uncle    Allergies Son        Hay Fever   Healthy Daughter    Colon cancer Neg Hx    Colon polyps Neg Hx    Stomach cancer Neg Hx    Rectal cancer Neg Hx     No Known Allergies  Current Outpatient Medications on File Prior to Visit  Medication Sig Dispense Refill   acetaminophen (TYLENOL) 325 MG tablet Take 2 tablets (650 mg total) by mouth every 6 (six) hours as needed for up to 30 doses for moderate pain or mild pain. 30 tablet 0   diclofenac Sodium (VOLTAREN) 1 % GEL Apply 4 g topically 4 (four) times daily. 150 g 0   esomeprazole (NEXIUM) 20 MG capsule Take 20 mg by mouth daily as needed.      lidocaine (LIDODERM) 5 % Place 1 patch onto the skin daily. Remove & Discard patch within 12 hours or as directed by MD 30 patch 0   meclizine (ANTIVERT) 12.5 MG tablet TAKE 1 TABLET(12.5 MG) BY MOUTH THREE TIMES DAILY AS NEEDED FOR DIZZINESS 30 tablet 0   metoprolol tartrate (LOPRESSOR) 25 MG tablet Take 0.5 tablets (12.5 mg total) by mouth 2 (two) times daily. 90 tablet 3   nitroGLYCERIN (NITRODUR - DOSED IN MG/24 HR) 0.2 mg/hr patch CUT AND APPLY 1/4 PATCH TO MOIST PAINFUL AREA EVERY 24 HOURS 30 patch 0   rosuvastatin (CRESTOR) 40 MG tablet TAKE 1 TABLET BY MOUTH DAILY 90 tablet 3   No current facility-administered medications on file prior to visit.    BP  (!) 146/80   Pulse 90   Temp 98.6 F (37 C)   Resp 18   Ht '5\' 6"'$  (1.676 m)   Wt 234 lb (106.1 kg)   LMP 08/22/2017 Comment: patient has some spotting last spotting was  in January   SpO2 97%   BMI 37.77 kg/m        Objective:   Physical Exam  General Mental Status- Alert. General Appearance- Not in acute distress.   Skin General: Color- Normal Color. Moisture- Normal Moisture.  Heent- nasal congestion. No sinus pressure.e  Chest and Lung Exam Auscultation: Breath Sounds:- clear even unlabored.  Cardiovascular Auscultation:Rythm- Regular. Murmurs & Other Heart Sounds:Auscultation of the heart reveals- No Murmurs.  Abdomen Inspection:-Inspeection Normal. Palpation/Percussion:Note:No mass. Palpation and Percussion of the abdomen reveal- Non Tender, Non Distended + BS, no rebound or guarding.   Neurologic Cranial Nerve exam:- CN III-XII intact(No nystagmus), symmetric smile. Strength:- 5/5 equal and symmetric strength both upper and lower extremities.   Lower ext- no pedal edema. Negative homans signs.    Assessment & Plan:   Patient Instructions  Patient is in today summarizing that 1 month ago she had viral/uri/flu like signs and symptoms and she was given Augmentin and promethazine.  She states that she did recover from that but then 1 day ago developed similar symptoms but milder.  Describing more upper respiratory infection signs and symptoms this time.  Flu and COVID test was negative today.  Some productive mucus when she coughs.  However at the onset of the interview she started off saying that it felt like somebody was sitting on her chest.  Later in the interview she brought this up again and on discussion states that since this morning she has had a persistent pressure sensation.  Yesterday she noticed some right side chest pain.  She has no nausea, vomiting jaw pain or shoulder pain.  On review of her chart she does have a history of smoking for 20 years.  On  review of her coronary calcium score it showed a 78 which was 68 percentile for age.  She does have history of PVCs and EKG today shows a range of 76 with short PR interval.  Looks similar to previous EKGs.  Counseled the patient that with her description of sensation of someone sitting on her chest and her risk factors it is best to have her evaluated in the emergency department today.  Will call down to the emergency department physician.  Follow-up date to be determined after emergency department evaluation.     Mackie Pai PA-C

## 2022-04-28 NOTE — ED Triage Notes (Signed)
Pt reports having "sniffles' Yesterday and body aches.  Woke up about 330 this morning with pressure in her chest and cough that was productive of beige mucus.  Pt went to her PCP who advised ED visit for eval.
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
## 2022-04-28 NOTE — ED Notes (Signed)
Pt had negative covid/flu upstairs at her doctor's office today.  Results in Epic

## 2022-05-11 ENCOUNTER — Ambulatory Visit (AMBULATORY_SURGERY_CENTER): Payer: BC Managed Care – PPO | Admitting: *Deleted

## 2022-05-11 VITALS — Ht 66.0 in | Wt 236.0 lb

## 2022-05-11 DIAGNOSIS — Z1211 Encounter for screening for malignant neoplasm of colon: Secondary | ICD-10-CM

## 2022-05-11 HISTORY — PX: COLONOSCOPY: SHX174

## 2022-05-11 MED ORDER — NA SULFATE-K SULFATE-MG SULF 17.5-3.13-1.6 GM/177ML PO SOLN: 1.0000 | Freq: Once | ORAL | 0 refills | Status: AC

## 2022-05-11 NOTE — Progress Notes (Signed)
No egg or soy allergy known to patient  No issues known to pt with past sedation with any surgeries or procedures Patient denies ever being told they had issues or difficulty with intubation  No FH of Malignant Hyperthermia Pt is not on diet pills Pt is not on  home 02  Pt is not on blood thinners  Pt denies issues with constipation  Pt is not on dialysis Pt denies any upcoming cardiac testing Pt encouraged to use to use Singlecare or Goodrx to reduce cost  Patient's chart reviewed by John Nulty CNRA prior to previsit and patient appropriate for the [facility].  Previsit completed and red dot placed by patient's name on their procedure day (on provider's schedule).  . Visit by phone Instructions reviewed with pt and pt states understanding. Instructed to review again prior to procedure. Pt states they will.  Instructions sent by mail and my chart 
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
## 2022-05-22 ENCOUNTER — Encounter: Payer: Self-pay | Admitting: Internal Medicine

## 2022-06-02 ENCOUNTER — Ambulatory Visit (AMBULATORY_SURGERY_CENTER): Payer: BC Managed Care – PPO | Admitting: Internal Medicine

## 2022-06-02 ENCOUNTER — Encounter: Payer: Self-pay | Admitting: Internal Medicine

## 2022-06-02 VITALS — BP 122/65 | HR 58 | Temp 97.7°F | Resp 13 | Ht 66.0 in | Wt 236.0 lb

## 2022-06-02 DIAGNOSIS — Z86010 Personal history of colonic polyps: Secondary | ICD-10-CM

## 2022-06-02 DIAGNOSIS — Z1211 Encounter for screening for malignant neoplasm of colon: Secondary | ICD-10-CM | POA: Diagnosis not present

## 2022-06-02 DIAGNOSIS — D124 Benign neoplasm of descending colon: Secondary | ICD-10-CM

## 2022-06-02 DIAGNOSIS — K635 Polyp of colon: Secondary | ICD-10-CM | POA: Diagnosis not present

## 2022-06-02 DIAGNOSIS — D125 Benign neoplasm of sigmoid colon: Secondary | ICD-10-CM

## 2022-06-02 DIAGNOSIS — Z09 Encounter for follow-up examination after completed treatment for conditions other than malignant neoplasm: Secondary | ICD-10-CM | POA: Diagnosis not present

## 2022-06-02 MED ORDER — SODIUM CHLORIDE 0.9 % IV SOLN: 500.0000 mL | Freq: Once | INTRAVENOUS | Status: DC

## 2022-06-02 NOTE — Progress Notes (Signed)
GASTROENTEROLOGY PROCEDURE H&P NOTE   Primary Care Physician: Mackie Pai, PA-C    Reason for Procedure:   Hx of polyps  Plan:    colonoscopy  Patient is appropriate for endoscopic procedure(s) in the ambulatory ([facility]) setting.  The nature of the procedure, as well as the risks, benefits, and alternatives were carefully and thoroughly reviewed with the patient. Ample time for discussion and questions allowed. The patient understood, was satisfied, and agreed to proceed.     HPI: Jasmin Stewart is a 57 y.o. female who presents for colonoscopy.  Medical history as below.  Tolerated the prep.  No recent chest pain or shortness of breath.  No abdominal pain today.  Past Medical History:  Diagnosis Date   Abdominal hernia    Anemia    Asthma    Fibroid, uterine    never had surgery   History of blood transfusion 1991; 1995   related to childbirth   History of chicken pox    Age 24   Hypercholesteremia    Hypoglycemia    Hypokalemia    Hypotension    Pneumonia 2013,2014, 2018   PVC (premature ventricular contraction)    Ulcer of esophagus     Past Surgical History:  Procedure Laterality Date   ABDOMINAL HERNIA REPAIR  2007; 06/05/2017   Suprapubic, Surgery 2007 with mesh VHR w/mesh; [facility]; 1995; 1998   ESOPHAGOGASTRODUODENOSCOPY  01/2017   HERNIA REPAIR  2007; 06/05/2017   INSERTION OF MESH N/A 06/05/2017   Procedure: INSERTION OF MESH;  Surgeon: Donnie Mesa, MD;  Location: [facility];  Service: General;  Laterality: N/A;   VENTRAL HERNIA REPAIR N/A 06/05/2017   Procedure: [facility];  Surgeon: Donnie Mesa, MD;  Location: [facility];  Service: General;  Laterality: N/A;    Prior to Admission medications   Medication Sig Start Date End Date Taking? Authorizing Provider  rosuvastatin (CRESTOR) 40 MG tablet TAKE 1 TABLET BY MOUTH DAILY 04/24/22  Yes Lorretta Harp, MD  acetaminophen  (TYLENOL) 325 MG tablet Take 2 tablets (650 mg total) by mouth every 6 (six) hours as needed for up to 30 doses for moderate pain or mild pain. 04/16/21   Wyvonnia Dusky, MD  diclofenac Sodium (VOLTAREN) 1 % GEL Apply 4 g topically 4 (four) times daily. 01/08/21   Malvin Johns, MD  esomeprazole (NEXIUM) 20 MG capsule Take 20 mg by mouth daily as needed.     [provider]  lidocaine (LIDODERM) 5 % Place 1 patch onto the skin daily. Remove & Discard patch within 12 hours or as directed by MD 01/08/21   Malvin Johns, MD  meclizine (ANTIVERT) 12.5 MG tablet TAKE 1 TABLET(12.5 MG) BY MOUTH THREE TIMES DAILY AS NEEDED FOR DIZZINESS Patient not taking: Reported on 05/11/2022 03/18/21   Saguier, Percell Miller, PA-C  metoprolol tartrate (LOPRESSOR) 25 MG tablet Take 0.5 tablets (12.5 mg total) by mouth 2 (two) times daily. 02/11/21   Lorretta Harp, MD  nitroGLYCERIN (NITRODUR - DOSED IN MG/24 HR) 0.2 mg/hr patch CUT AND APPLY 1/4 PATCH TO MOIST PAINFUL AREA EVERY 24 HOURS 03/07/21   Rosemarie Ax, MD    Current Outpatient Medications  Medication Sig Dispense Refill   rosuvastatin (CRESTOR) 40 MG tablet TAKE 1 TABLET BY MOUTH DAILY 90 tablet 3   acetaminophen (TYLENOL) 325 MG tablet Take 2 tablets (650 mg total) by mouth every 6 (six)  hours as needed for up to 30 doses for moderate pain or mild pain. 30 tablet 0   diclofenac Sodium (VOLTAREN) 1 % GEL Apply 4 g topically 4 (four) times daily. 150 g 0   esomeprazole (NEXIUM) 20 MG capsule Take 20 mg by mouth daily as needed.      lidocaine (LIDODERM) 5 % Place 1 patch onto the skin daily. Remove & Discard patch within 12 hours or as directed by MD 30 patch 0   meclizine (ANTIVERT) 12.5 MG tablet TAKE 1 TABLET(12.5 MG) BY MOUTH THREE TIMES DAILY AS NEEDED FOR DIZZINESS (Patient not taking: Reported on 05/11/2022) 30 tablet 0   metoprolol tartrate (LOPRESSOR) 25 MG tablet Take 0.5 tablets (12.5 mg total) by mouth 2 (two) times daily. 90 tablet 3    nitroGLYCERIN (NITRODUR - DOSED IN MG/24 HR) 0.2 mg/hr patch CUT AND APPLY 1/4 PATCH TO MOIST PAINFUL AREA EVERY 24 HOURS 30 patch 0   Current Facility-Administered Medications  Medication Dose Route Frequency Provider Last Rate Last Admin   0.9 %  sodium chloride infusion  500 mL Intravenous Once Dineen Conradt, Lajuan Lines, MD        Allergies as of 06/02/2022   (No Known Allergies)    Family History  Problem Relation Age of Onset   Thyroid disease Mother    Atrial fibrillation Mother    Hyperlipidemia Mother    Hyperlipidemia Sister    Breast cancer Sister    Hyperlipidemia Brother    Thyroid disease Maternal Grandmother    Glaucoma Maternal Grandmother    Cataracts Maternal Grandmother    Esophageal cancer Maternal Grandfather    Hypertension Maternal Uncle    Obesity Maternal Uncle    Allergies Son        Hay Fever   Healthy Daughter    Colon cancer Neg Hx    Colon polyps Neg Hx    Stomach cancer Neg Hx    Rectal cancer Neg Hx     Social History   Socioeconomic History   Marital status: Single    Spouse name: Not on file   Number of children: Not on file   Years of education: Not on file   Highest education level: Not on file  Occupational History   Not on file  Tobacco Use   Smoking status: Every Day    Packs/day: 7.00    Years: 36.00    Total pack years: 252.00    Types: Cigarettes   Smokeless tobacco: Never  Vaping Use   Vaping Use: Former  Substance and Sexual Activity   Alcohol use: Not Currently    Alcohol/week: 0.0 standard drinks of alcohol   Drug use: No   Sexual activity: Yes    Birth control/protection: Post-menopausal    Comment: 1st intercourse 57 yo-More than 5 partners  Other Topics Concern   Not on file  Social History Narrative   Not on file   Social Determinants of Health   Financial Resource Strain: Not on file  Food Insecurity: Not on file  Transportation Needs: Not on file  Physical Activity: Not on file  Stress: Not on file  Social  Connections: Not on file  Intimate Partner Violence: Not on file    Physical Exam: Vital signs in last 24 hours: '@BP'$  100/65 (BP Location: Right Arm, Patient Position: Sitting, Cuff Size: Normal)   Pulse 80   Temp 97.7 F (36.5 C) (Temporal)   Ht '5\' 6"'$  (1.676 m)   Wt 236 lb (107  kg)   LMP 08/22/2017 Comment: patient has some spotting last spotting was in January   SpO2 96%   BMI 38.09 kg/m  GEN: NAD EYE: Sclerae anicteric ENT: MMM CV: Non-tachycardic Pulm: CTA b/l GI: Soft, NT/ND NEURO:  Alert & Oriented x 3   Zenovia Jarred, MD [facility]  06/02/2022 8:32 AM

## 2022-06-02 NOTE — Patient Instructions (Signed)
Thank you for letting us take care of your healthcare needs today. Please see handouts given to you on Polyps.    YOU HAD AN ENDOSCOPIC PROCEDURE TODAY AT [facility]:   Refer to the procedure report that was given to you for any specific questions about what was found during the examination.  If the procedure report does not answer your questions, please call your gastroenterologist to clarify.  If you requested that your care partner not be given the details of your procedure findings, then the procedure report has been included in a sealed envelope for you to review at your convenience later.  YOU SHOULD EXPECT: Some feelings of bloating in the abdomen. Passage of more gas than usual.  Walking can help get rid of the air that was put into your GI tract during the procedure and reduce the bloating. If you had a lower endoscopy (such as a colonoscopy or flexible sigmoidoscopy) you may notice spotting of blood in your stool or on the toilet paper. If you underwent a bowel prep for your procedure, you may not have a normal bowel movement for a few days.  Please Note:  You might notice some irritation and congestion in your nose or some drainage.  This is from the oxygen used during your procedure.  There is no need for concern and it should clear up in a day or so.  SYMPTOMS TO REPORT IMMEDIATELY:  Following lower endoscopy (colonoscopy or flexible sigmoidoscopy):  Excessive amounts of blood in the stool  Significant tenderness or worsening of abdominal pains  Swelling of the abdomen that is new, acute  Fever of 100F or higher   For urgent or emergent issues, a gastroenterologist can be reached at any hour by calling [phone]. Do not use MyChart messaging for urgent concerns.    DIET:  We do recommend a small meal at first, but then you may proceed to your regular diet.  Drink plenty of fluids but you should avoid alcoholic beverages for 24 hours.  ACTIVITY:  You  should plan to take it easy for the rest of today and you should NOT DRIVE or use heavy machinery until tomorrow (because of the sedation medicines used during the test).    FOLLOW UP: Our staff will call the number listed on your records the next business day following your procedure.  We will call around 7:15- 8:00 am to check on you and address any questions or concerns that you may have regarding the information given to you following your procedure. If we do not reach you, we will leave a message.     If any biopsies were taken you will be contacted by phone or by letter within the next 1-3 weeks.  Please call us at [phone] if you have not heard about the biopsies in 3 weeks.    SIGNATURES/CONFIDENTIALITY: You and/or your care partner have signed paperwork which will be entered into your electronic medical record.  These signatures attest to the fact that that the information above on your After Visit Summary has been reviewed and is understood.  Full responsibility of the confidentiality of this discharge information lies with you and/or your care-partner.

## 2022-06-02 NOTE — Progress Notes (Signed)
Called to room to assist during endoscopic procedure.  Patient ID and intended procedure confirmed with present staff. Received instructions for my participation in the procedure from the performing physician.  

## 2022-06-02 NOTE — Progress Notes (Signed)
Sedate, gd SR, tolerated procedure well, VSS, report to RN 

## 2022-06-02 NOTE — Op Note (Signed)
[facility] Patient Name: Jasmin Stewart Procedure Date: 06/02/2022 8:35 AM MRN: [id number] Endoscopist: Jerene Bears , MD, [id number] Age: 57 Referring MD:  Date of Birth: [birth year] Gender: Female Account #: [id number] Procedure:                Colonoscopy Indications:              High risk colon cancer surveillance: Personal                            history of multiple adenomas + SSP at last exam (5                            in total), Last colonoscopy: February 2020 Medicines:                Monitored Anesthesia Care Procedure:                Pre-Anesthesia Assessment:                           - Prior to the procedure, a History and Physical                            was performed, and patient medications and                            allergies were reviewed. The patient's tolerance of                            previous anesthesia was also reviewed. The risks                            and benefits of the procedure and the sedation                            options and risks were discussed with the patient.                            All questions were answered, and informed consent                            was obtained. Prior Anticoagulants: The patient has                            taken no anticoagulant or antiplatelet agents. ASA                            Grade Assessment: II - A patient with mild systemic                            disease. After reviewing the risks and benefits,                            the patient was deemed in satisfactory condition to  undergo the procedure.                           After obtaining informed consent, the colonoscope                            was passed under direct vision. Throughout the                            procedure, the patient's blood pressure, pulse, and                            oxygen saturations were monitored continuously. The                            CF [id number] was  introduced through the anus                            and advanced to the cecum, identified by                            appendiceal orifice and ileocecal valve. The                            colonoscopy was performed without difficulty. The                            patient tolerated the procedure well. The quality                            of the bowel preparation was good. The ileocecal                            valve, appendiceal orifice, and rectum were                            photographed. Scope In: 8:42:27 AM Scope Out: 8:56:22 AM Scope Withdrawal Time: 0 hours 12 minutes 17 seconds  Total Procedure Duration: 0 hours 13 minutes 55 seconds  Findings:                 The digital rectal exam was normal.                           A 4 mm polyp was found in the descending colon. The                            polyp was sessile. The polyp was removed with a                            cold snare. Resection and retrieval were complete.                           Two sessile polyps were found in the sigmoid colon.  The polyps were 3 to 5 mm in size. These polyps                            were removed with a cold snare. Resection and                            retrieval were complete.                           The exam was otherwise without abnormality on                            direct and retroflexion views. Complications:            No immediate complications. Estimated Blood Loss:     Estimated blood loss: none. Impression:               - One 4 mm polyp in the descending colon, removed                            with a cold snare. Resected and retrieved.                           - Two 3 to 5 mm polyps in the sigmoid colon,                            removed with a cold snare. Resected and retrieved.                           - The examination was otherwise normal on direct                            and retroflexion views. Recommendation:            - Patient has a contact number available for                            emergencies. The signs and symptoms of potential                            delayed complications were discussed with the                            patient. Return to normal activities tomorrow.                            Written discharge instructions were provided to the                            patient.                           - Resume previous diet.                           -  Continue present medications.                           - Await pathology results.                           - Repeat colonoscopy in 5 years for surveillance. Jerene Bears, MD 06/02/2022 9:01:21 AM This report has been signed electronically.

## 2022-06-02 NOTE — Progress Notes (Signed)
Vitals-DT  Pt's states no medical or surgical changes since previsit or office visit.  

## 2022-06-05 ENCOUNTER — Telehealth: Payer: Self-pay

## 2022-06-05 NOTE — Telephone Encounter (Signed)
  Follow up Call-     06/02/2022    7:29 AM 06/02/2022    7:21 AM  Call back number  Post procedure Call Back phone  # [phone]   Permission to leave phone message  Yes     Patient questions:  Do you have a fever, pain , or abdominal swelling? No. Pain Score  0 *  Have you tolerated food without any problems? Yes.    Have you been able to return to your normal activities? Yes.    Do you have any questions about your discharge instructions: Diet   No. Medications  No. Follow up visit  No.  Do you have questions or concerns about your Care? No.  Actions: * If pain score is 4 or above: No action needed, pain <4.

## 2022-06-06 ENCOUNTER — Encounter: Payer: Self-pay | Admitting: Internal Medicine

## 2022-06-12 ENCOUNTER — Encounter: Payer: Self-pay | Admitting: Internal Medicine

## 2022-07-14 ENCOUNTER — Other Ambulatory Visit: Payer: Self-pay | Admitting: Cardiovascular Disease

## 2022-07-14 DIAGNOSIS — I493 Ventricular premature depolarization: Secondary | ICD-10-CM

## 2022-07-17 NOTE — Telephone Encounter (Signed)
Patient was last seen on 02/11/21. Patient is requesting refill. Please advise.

## 2022-07-20 ENCOUNTER — Other Ambulatory Visit (HOSPITAL_BASED_OUTPATIENT_CLINIC_OR_DEPARTMENT_OTHER): Payer: Self-pay | Admitting: Family Medicine

## 2022-07-20 ENCOUNTER — Encounter (HOSPITAL_BASED_OUTPATIENT_CLINIC_OR_DEPARTMENT_OTHER): Payer: Self-pay | Admitting: Family Medicine

## 2022-07-20 ENCOUNTER — Ambulatory Visit (HOSPITAL_BASED_OUTPATIENT_CLINIC_OR_DEPARTMENT_OTHER)
Admission: RE | Admit: 2022-07-20 | Discharge: 2022-07-20 | Disposition: A | Discharge status: Home / Self Care | Payer: BC Managed Care – PPO | Source: Ambulatory Visit | Attending: Family Medicine | Admitting: Family Medicine

## 2022-07-20 DIAGNOSIS — M79605 Pain in left leg: Secondary | ICD-10-CM

## 2022-07-20 DIAGNOSIS — R002 Palpitations: Secondary | ICD-10-CM | POA: Diagnosis not present

## 2022-07-21 DIAGNOSIS — M7122 Synovial cyst of popliteal space [Baker], left knee: Secondary | ICD-10-CM | POA: Diagnosis not present

## 2022-07-24 ENCOUNTER — Encounter: Payer: Self-pay | Admitting: *Deleted

## 2022-07-25 ENCOUNTER — Ambulatory Visit: Payer: BC Managed Care – PPO | Admitting: Family Medicine

## 2022-07-26 ENCOUNTER — Ambulatory Visit: Payer: BC Managed Care – PPO | Admitting: Family Medicine

## 2022-07-26 ENCOUNTER — Ambulatory Visit (INDEPENDENT_AMBULATORY_CARE_PROVIDER_SITE_OTHER): Payer: BC Managed Care – PPO

## 2022-07-26 ENCOUNTER — Other Ambulatory Visit: Payer: Self-pay

## 2022-07-26 VITALS — BP 142/82 | HR 100 | Ht 66.0 in | Wt 235.0 lb

## 2022-07-26 DIAGNOSIS — M79662 Pain in left lower leg: Secondary | ICD-10-CM | POA: Diagnosis not present

## 2022-07-26 DIAGNOSIS — M7122 Synovial cyst of popliteal space [Baker], left knee: Secondary | ICD-10-CM | POA: Diagnosis not present

## 2022-07-26 DIAGNOSIS — M5137 Other intervertebral disc degeneration, lumbosacral region: Secondary | ICD-10-CM | POA: Diagnosis not present

## 2022-07-26 MED ORDER — PREGABALIN 75 MG PO CAPS: 75.0000 mg | ORAL_CAPSULE | Freq: Every evening | ORAL | 3 refills | Status: DC | PRN

## 2022-07-26 NOTE — Progress Notes (Signed)
I, [name], CMA acting as a Neurosurgeon for Jasmin Graham, MD.  Subjective:    CC: Left leg pain  HPI: Patient is a 57 year old female presenting with left leg pain ongoing for about 2 weeks. She had been longer walks and rode her indoor bike. She's been seen previously by Dr. Jordan Likes. Pain significantly worsened last Monday-Tuesdays.  Patient locates pain to proximal L calf and slightly into the popliteal fossa.  Pain is more on the lateral posterior knee than the medial posterior knee and does extend into the proximal calf a bit.  Pain is worse with sitting better with standing.  She has trouble finding a comfortable position.  She is having difficulty sleeping due to pain.  She notes a prior history of not tolerating gabapentin very well.  L Knee swelling: no Mechanical symptoms: no Aggravates: rest/sitting/laying down Treatments tried: changing sitting positions, moving around, elevation  Dx testing: 07/20/2022 left LE vascular ultrasound notices   Pertinent review of Systems: No fevers or chills  Relevant historical information: Calcific tendinitis left shoulder rotator cuff   Objective:    Vitals:   07/26/22 0910 07/26/22 0923  BP: (!) 142/82 (!) 142/82  Pulse: 100   SpO2: 96%    General: Well Developed, well nourished, and in no acute distress.   MSK: Left knee: Normal-appearing Tender palpation posterior lateral knee. Normal motion. Intact strength. Positive left-sided slump test. Reflexes are intact distally.  Lab and Radiology Results  Procedure: Real-time Ultrasound Guided Injection of left knee Baker's cyst Device: Philips Affiniti 50G Images permanently stored and available for review in PACS Ultrasound evaluation prior to injection reveals a small Baker's cyst measuring about a centimeter in width.  This is decreased from prior documentation with vascular ultrasound where it was larger. Verbal informed consent obtained.  Discussed risks and benefits  of procedure. Warned about infection, bleeding, hyperglycemia damage to structures among others. Patient expresses understanding and agreement Time-out conducted.   Noted no overlying erythema, induration, or other signs of local infection.   Skin prepped in a sterile fashion.   Local anesthesia: Topical Ethyl chloride.   With sterile technique and under real time ultrasound guidance: 40 mg of Kenalog and 2 mL Marcaine injected into Baker's cyst posterior medial knee. Fluid seen entering the Baker's cyst.   Completed without difficulty   Pain moderately resolved suggesting accurate placement of the medication.   Advised to call if fevers/chills, erythema, induration, drainage, or persistent bleeding.   Images permanently stored and available for review in the ultrasound unit.  Impression: Technically successful ultrasound guided injection.    X-ray images lumbar spine obtained today personally and independently interpreted DDD L5-S1.  No acute fractures are visible. Await formal radiology review    Impression and Recommendations:    Assessment and Plan: 57 y.o. female with posterior lateral knee pain rated as severe at times.  Baker's cyst was identified on a vascular ultrasound on April 11.  However today the Baker's cyst is much smaller and located on the medial side of the knee where her pain is on the lateral part of the knee.  I did proceed with an injection of the Baker's cyst as it was too small to aspirate.  I am hopeful that that will help.  However I think it is possible that her pain is coming from lumbosacral radiculopathy left S1.  I have prescribed Lyrica to take at bedtime and we will be prescribing prednisone if the injection today does not help.  May benefit from MRI in the future if needed.  PDMP reviewed during this encounter. Orders Placed This Encounter  Procedures   [place] LIMITED JOINT SPACE STRUCTURES LOW LEFT(NO LINKED CHARGES)    Order Specific Question:   Reason  for Exam (SYMPTOM  OR DIAGNOSIS REQUIRED)    Answer:   left lower leg pain    Order Specific Question:   Preferred imaging location?    Answer:   [facility] Lumbar Spine 2-3 Views    Standing Status:   Future    Number of Occurrences:   1    Standing Expiration Date:   07/26/2023    Order Specific Question:   Reason for Exam (SYMPTOM  OR DIAGNOSIS REQUIRED)    Answer:   eval poss left sciatica    Order Specific Question:   Is patient pregnant?    Answer:   No    Order Specific Question:   Preferred imaging location?    Answer:   [facility]   Meds ordered this encounter  Medications   pregabalin (LYRICA) 75 MG capsule    Sig: Take 1 capsule (75 mg total) by mouth at bedtime as needed (nerve pain).    Dispense:  30 capsule    Refill:  3    Discussed warning signs or symptoms. Please see discharge instructions. Patient expresses understanding.   The above documentation has been reviewed and is accurate and complete Jasmin Stewart, M.D.

## 2022-07-26 NOTE — Patient Instructions (Addendum)
Thank you for coming in today.   You received an injection today. Seek immediate medical attention if the joint becomes red, extremely painful, or is oozing fluid.   Try the lyrica at bedtime.   If this is not working tomorrow let me know and I will prescribe prednisone.   Please get an Xray today before you leave

## 2022-07-31 ENCOUNTER — Encounter: Payer: Self-pay | Admitting: Family Medicine

## 2022-07-31 DIAGNOSIS — M5416 Radiculopathy, lumbar region: Secondary | ICD-10-CM

## 2022-07-31 NOTE — Progress Notes (Signed)
Lumbar spine x-ray shows mild arthritis changes in the low back.

## 2022-08-02 DIAGNOSIS — E782 Mixed hyperlipidemia: Secondary | ICD-10-CM | POA: Diagnosis not present

## 2022-08-02 DIAGNOSIS — Z719 Counseling, unspecified: Secondary | ICD-10-CM | POA: Diagnosis not present

## 2022-08-02 DIAGNOSIS — R7303 Prediabetes: Secondary | ICD-10-CM | POA: Diagnosis not present

## 2022-08-05 ENCOUNTER — Ambulatory Visit (INDEPENDENT_AMBULATORY_CARE_PROVIDER_SITE_OTHER): Payer: BC Managed Care – PPO

## 2022-08-05 DIAGNOSIS — M545 Low back pain, unspecified: Secondary | ICD-10-CM

## 2022-08-05 DIAGNOSIS — M5416 Radiculopathy, lumbar region: Secondary | ICD-10-CM

## 2022-08-09 ENCOUNTER — Encounter: Payer: Self-pay | Admitting: Family Medicine

## 2022-08-10 NOTE — Progress Notes (Signed)
Lumbar spine MRI indicates that some of your leg pain could be coming from a pinched nerve in your back.  Recommend return to clinic to go over the results in full detail and discuss  treatment plan and options including injection.

## 2022-08-15 ENCOUNTER — Encounter: Payer: Self-pay | Admitting: Family Medicine

## 2022-08-15 ENCOUNTER — Ambulatory Visit: Payer: BC Managed Care – PPO | Admitting: Family Medicine

## 2022-08-15 VITALS — BP 120/84 | HR 84 | Ht 66.0 in | Wt 235.0 lb

## 2022-08-15 DIAGNOSIS — M5416 Radiculopathy, lumbar region: Secondary | ICD-10-CM | POA: Diagnosis not present

## 2022-08-15 NOTE — Patient Instructions (Signed)
Thank you for coming in today.   Ok to use lyrica as needed.   OK to wean off now and use just if you need to.   We can do an epidural steroid injection if needed in the future.   Let me know if you have a problem.

## 2022-08-15 NOTE — Progress Notes (Unsigned)
I, [name], CMA acting as a scribe for Clementeen Graham, MD.  Jasmin Stewart is a 57 y.o. female who presents to [facility] at [facility] today for f/u L LE pain w/ MRI review. Pt was last seen by Dr. Denyse Amass on 07/26/22 and her Baker's cyst was injected along the posterior-medial L knee. Also, she was prescribed Lyrica, prednisone. Based on XR results and lack of improvement a L-spine MRI was ordered. Today, pt reports significant improvement of LE sx with Lyrica, sleeping through the night.   Dx testing: 08/05/22 L-spine MRI  07/26/22 L-spine XR 07/20/2022 L LE vascular ultrasound   Pertinent review of systems: No fevers or chills  Relevant historical information: Hyperlipidemia   Exam:  BP 120/84   Pulse 84   Ht 5\' 6"  (1.676 m)   Wt 235 lb (106.6 kg)   LMP 08/22/2017 Comment: patient has some spotting last spotting was in January   SpO2 97%   BMI 37.93 kg/m  General: Well Developed, well nourished, and in no acute distress.   MSK: L-spine nontender to palpation normal lumbar motion lower extremity strength is intact.    Lab and Radiology Results  EXAM: MRI LUMBAR SPINE WITHOUT CONTRAST   TECHNIQUE: Multiplanar, multisequence MR imaging of the lumbar spine was performed. No intravenous contrast was administered.   COMPARISON:  Lumbar radiographs 07/26/2022.   Chest CT 04/16/2021 and CT Abdomen and Pelvis 09/19/2019.   FINDINGS: Segmentation: Confirmed full size ribs at T12, partially lumbarized S1 level on prior CTs. This is the same numbering system used last month. Correlation with radiographs is recommended prior to any operative intervention.   Alignment: Stable lumbar lordosis since 2021. Subtle chronic lower lumbar dextroconvex scoliosis. No significant spondylolisthesis.   Vertebrae: Mild degenerative lumbar endplate spurring (most pronounced at L4-L5 on the left). No marrow edema or evidence of acute osseous abnormality. Visualized bone  marrow signal is within normal limits. Intact visible sacrum.   Conus medullaris and cauda equina: Conus extends to the L2-L3 level. No lower spinal cord or conus signal abnormality. Unremarkable cauda equina nerve roots.   Paraspinal and other soft tissues: Stable visible abdominal viscera, including mildly dysplastic appearance of the right kidney. Negative visualized posterior paraspinal soft tissues.   Disc levels:   T12-L1:  Negative.   L1-L2:  Negative.   L2-L3:  Negative.   L3-L4:  Negative disc.  Mild facet hypertrophy.  No stenosis.   L4-L5: Subtle disc desiccation and disc bulging. Mild facet hypertrophy. Mild asymmetric right foraminal disc on series 2, image 5. Mild right L4 neural foraminal stenosis.   L5-S1: Disc desiccation and some disc space loss. Circumferential disc bulge. Midline annular fissure of the disc. Asymmetric left subarticular and foraminal disc, endplate spurring. Mild facet hypertrophy. No spinal stenosis. Borderline to mild left lateral recess stenosis (left S1 nerve level). Mild to moderate left L5 neural foraminal stenosis.   S1-S2: Partially lumbarized. Mild left far lateral disc osteophyte complex. No convincing stenosis.   IMPRESSION: 1. Transitional lumbosacral anatomy confirmed on prior CTs with partially lumbarized S1 level. Correlation with radiographs is recommended prior to any operative intervention. 2. Leftward L5-S1 disc and endplate degeneration with mild left lateral recess and up to moderate left neural foraminal stenosis. Query Left L5 and/or S1 radiculitis. 3. Mild for age lumbar spine degeneration elsewhere, including mild right L4 nerve level foraminal stenosis. No lumbar spinal stenosis.     Electronically Signed   By: Althea Grimmer.D.  On: 08/09/2022 11:39 I, Clementeen Graham, personally (independently) visualized and performed the interpretation of the images attached in this note.      Assessment and Plan: 57  y.o. female with left lumbar radiculopathy thought to be L5 or S1 now spontaneously improving.  MRI does support this diagnosis.  Since she is feeling better I do not think there is much urgency to proceed with invasive treatment.  Recommend trying to wean off of Lyrica and if needed we can order an epidural steroid injection which would be the logical next step.  Happy to refill Lyrica in the future if needed and happy to order the epidural steroid injection in future if needed.  We talked about the MRI results and potential epidural steroid injections. Total encounter time 30 minutes including face-to-face time with the patient and, reviewing past medical record, and charting on the date of service.       Discussed warning signs or symptoms. Please see discharge instructions. Patient expresses understanding.   The above documentation has been reviewed and is accurate and complete Clementeen Graham, M.D.

## 2022-08-17 ENCOUNTER — Other Ambulatory Visit: Payer: Self-pay | Admitting: Cardiovascular Disease

## 2022-08-17 DIAGNOSIS — I493 Ventricular premature depolarization: Secondary | ICD-10-CM

## 2022-09-05 ENCOUNTER — Encounter: Payer: Self-pay | Admitting: Nurse Practitioner

## 2022-09-05 ENCOUNTER — Ambulatory Visit (INDEPENDENT_AMBULATORY_CARE_PROVIDER_SITE_OTHER): Payer: BC Managed Care – PPO | Admitting: Nurse Practitioner

## 2022-09-05 VITALS — BP 128/80 | HR 74 | Ht 67.0 in | Wt 234.0 lb

## 2022-09-05 DIAGNOSIS — Z01419 Encounter for gynecological examination (general) (routine) without abnormal findings: Secondary | ICD-10-CM

## 2022-09-05 DIAGNOSIS — Z780 Asymptomatic menopausal state: Secondary | ICD-10-CM

## 2022-09-05 NOTE — Progress Notes (Signed)
   Jasmin Stewart 09-Jul-1965 [id number]   History:  57 y.o. [id number] presents for annual exam. Postmenopausal - no HRT, no bleeding. Normal pap and mammogram history. Smoker - 1/2 ppd. HTN, HLD managed by cardiology.   Gynecologic History Patient's last menstrual period was 08/22/2017.   Contraception/Family planning: post menopausal status Sexually active: No  Health Maintenance Last Pap: 08/24/2020. Results were: Normal neg HPV, 5-year repeat Last mammogram: 01/27/2022. Results were: Normal Last colonoscopy: 06/02/2022. Results were: Benign polyps, 5-year recall Last Dexa: Not indicated  Past medical history, past surgical history, family history and social history were all reviewed and documented in the EPIC chart. Long-term boyfriend. 4 children - oldest son is 2, daughters age 72-33. 3 grandchildren. Work for [employer].   ROS:  A ROS was performed and pertinent positives and negatives are included.  Exam:  Vitals:   09/05/22 1456  BP: 128/80  Pulse: 74  SpO2: 98%  Weight: 234 lb (106.1 kg)  Height: 5\' 7"  (1.702 m)     Body mass index is 36.65 kg/m.  General appearance:  Normal Thyroid:  Symmetrical, normal in size, without palpable masses or nodularity. Respiratory  Auscultation:  Clear without wheezing or rhonchi Cardiovascular  Auscultation:  Regular rate, without rubs, murmurs or gallops  Edema/varicosities:  Not grossly evident Abdominal  Soft,nontender, without masses, guarding or rebound.  Liver/spleen:  No organomegaly noted  Hernia:  None appreciated  Skin  Inspection:  Grossly normal Breasts: Examined lying and sitting.   Right: Without masses, retractions, nipple discharge or axillary adenopathy.   Left: Without masses, retractions, nipple discharge or axillary adenopathy. Genitourinary   Inguinal/mons:  Normal without inguinal adenopathy  External genitalia:  Normal appearing vulva with no masses, tenderness, or lesions  BUS/Urethra/Skene's  glands:  Normal  Vagina:  Normal appearing with normal color and discharge, no lesions  Cervix:  Normal appearing without discharge or lesions  Uterus:  Normal in size, shape and contour.  Midline and mobile, nontender  Adnexa/parametria:     Rt: Normal in size, without masses or tenderness.   Lt: Normal in size, without masses or tenderness.  Anus and perineum: Normal  Digital rectal exam: Declines  Patient informed chaperone available to be present for breast and pelvic exam. Patient has requested no chaperone to be present. Patient has been advised what will be completed during breast and pelvic exam.   Assessment/Plan:  57 y.o. [id number] for annual exam.   Well female exam with routine gynecological exam - Education provided on SBEs, importance of preventative screenings, current guidelines, high calcium diet, regular exercise, and multivitamin daily. Labs with work tomorrow.   Postmenopausal - no HRT, no bleeding.   Screening for cervical cancer - Normal Pap history.  Will repeat at 5-year interval per guidelines.   Screening for breast cancer - Normal mammogram history.  Continue annual screenings.  Normal breast exam today.  Screening for colon cancer - 05/2022 colonoscopy. Will repeat at GI's recommended interval.   Return in 1 year for annual.      Olivia Mackie DNP, 3:17 PM 09/05/2022

## 2022-09-19 ENCOUNTER — Encounter: Payer: Self-pay | Admitting: Family Medicine

## 2022-09-20 ENCOUNTER — Encounter: Payer: Self-pay | Admitting: Family Medicine

## 2022-09-20 ENCOUNTER — Other Ambulatory Visit: Payer: Self-pay | Admitting: Cardiovascular Disease

## 2022-09-20 ENCOUNTER — Ambulatory Visit: Payer: BC Managed Care – PPO | Admitting: Family Medicine

## 2022-09-20 VITALS — BP 106/82 | HR 72 | Ht 67.0 in | Wt 237.8 lb

## 2022-09-20 DIAGNOSIS — M79671 Pain in right foot: Secondary | ICD-10-CM

## 2022-09-20 DIAGNOSIS — I493 Ventricular premature depolarization: Secondary | ICD-10-CM

## 2022-09-20 MED ORDER — NITROGLYCERIN 0.2 MG/HR TD PT24: MEDICATED_PATCH | TRANSDERMAL | 0 refills | Status: DC

## 2022-09-20 NOTE — Progress Notes (Signed)
   I, [name], CMA acting as a scribe for Clementeen Graham, MD.  Jasmin Stewart is a 57 y.o. female who presents to [facility] at [facility] today for R foot pain. Pt was previously seen by Dr. Denyse Amass on 08/15/22 for lumbar radiculopathy.  Today, pt c/o R foot pain x 2-3 weeks. Pt locates pain to the arch of the foot, palpable nodule per pt. Tries to avoid walking bare foot. Jasmin Stewart has been helpful. Has tried new shoes and slippers with no change in sx. Gets the most relief with Volatren and massage. Tried PF sleeve, this caused the toes to turn purple.   Aggravates: WB, worse first thing in the mornings.  Treatments tried: Voltaren gel  Pertinent review of systems: No fevers or chills  Relevant historical information: PVC.  Hyperlipidemia.   Exam:  LMP 08/22/2017 Comment: patient has some spotting last spotting was in January  General: Well Developed, well nourished, and in no acute distress.   MSK: Right foot normal appearing Normal motion. Mild tender palpation plantar mid arch.  Nontender plantar fascia origin plantar calcaneus.  Normal foot and ankle motion. Intact strength.    Lab and Radiology Results No results found for this or any previous visit (from the past 72 hour(s)). No results found.     Assessment and Plan: 57 y.o. female with right foot pain thought to be due to plantar fascial strain.  The location of pain is not entirely consistent with classic Planter fasciitis.  However I do think basic plantar fascia treatment should be helpful.  Recommend arch strap night splints eccentric exercises and ice massage.  Given the location of pain this should be helpful with nitroglycerin patch protocol.  She is used that before for rotator cuff tendinitis and has some still left.   PDMP not reviewed this encounter. No orders of the defined types were placed in this encounter.  No orders of the defined types were placed in this  encounter.    Discussed warning signs or symptoms. Please see discharge instructions. Patient expresses understanding.   The above documentation has been reviewed and is accurate and complete Clementeen Graham, M.D.

## 2022-09-20 NOTE — Patient Instructions (Addendum)
Thank you for coming in today.   Night Splint Plantar fasciitis.   Arch Strap for plantar fasciitis.   Nitroglycerin Protocol  Apply 1/4 nitroglycerin patch to affected area daily. Change position of patch within the affected area every 24 hours. You may experience a headache during the first 1-2 weeks of using the patch, these should subside. If you experience headaches after beginning nitroglycerin patch treatment, you may take your preferred over the counter pain reliever. Another side effect of the nitroglycerin patch is skin irritation or rash related to patch adhesive. Please notify our office if you develop more severe headaches or rash, and stop the patch. Tendon healing with nitroglycerin patch may require 12 to 24 weeks depending on the extent of injury. Men should not use if taking Viagra, Cialis, or Levitra.  Do not use if you have migraines or rosacea.   Please complete the exercises that the athletic trainer went over with you:  View at [email or web address]-exercise-[email or web address] using code: [id number]  Recheck in 1 month.

## 2022-10-25 ENCOUNTER — Telehealth: Payer: Self-pay | Admitting: Cardiovascular Disease

## 2022-10-25 NOTE — Telephone Encounter (Signed)
Call and goes to VM.  LM to please call office.

## 2022-10-25 NOTE — Telephone Encounter (Signed)
Spoke with pt, she has noticed in the last couple of days she has noticed and increase in the heart fluttering. Her zio monitor from 2022 showed pac's and pvc's. She does not indorse any increased caffeine or stress. She has not been taking her metoprolol on a daily basis and she has taken it for the last 2 days with no change. Encouraged patient to take metoprolol on a daily bases until follow up appointment 11/08/22. Pt agreed with this plan and will call back with changes or concerns.

## 2022-10-25 NOTE — Telephone Encounter (Signed)
Patient c/o Palpitations:  High priority if patient c/o lightheadedness, shortness of breath, or chest pain  How long have you had palpitations/irregular HR/ Afib? Are you having the symptoms now? Since 07/16 around 9 a.m. and having the symptoms now   Are you currently experiencing lightheadedness, SOB or CP? No   Do you have a history of afib (atrial fibrillation) or irregular heart rhythm? Yes   Have you checked your BP or HR? (document readings if available): HR 82 and 105   Are you experiencing any other symptoms? Patient states that it feels like her heart is dropping down into her stomach and this feel continuous.

## 2022-10-26 DIAGNOSIS — R002 Palpitations: Secondary | ICD-10-CM | POA: Diagnosis not present

## 2022-10-26 DIAGNOSIS — S7011XA Contusion of right thigh, initial encounter: Secondary | ICD-10-CM | POA: Diagnosis not present

## 2022-11-02 DIAGNOSIS — H5203 Hypermetropia, bilateral: Secondary | ICD-10-CM | POA: Diagnosis not present

## 2022-11-08 ENCOUNTER — Ambulatory Visit: Payer: BC Managed Care – PPO | Attending: Cardiovascular Disease | Admitting: Cardiovascular Disease

## 2022-11-08 ENCOUNTER — Encounter: Payer: Self-pay | Admitting: Cardiovascular Disease

## 2022-11-08 VITALS — BP 106/72 | HR 78 | Ht 66.0 in | Wt 239.6 lb

## 2022-11-08 DIAGNOSIS — E782 Mixed hyperlipidemia: Secondary | ICD-10-CM | POA: Diagnosis not present

## 2022-11-08 DIAGNOSIS — R931 Abnormal findings on diagnostic imaging of heart and coronary circulation: Secondary | ICD-10-CM

## 2022-11-08 DIAGNOSIS — R002 Palpitations: Secondary | ICD-10-CM

## 2022-11-08 DIAGNOSIS — I493 Ventricular premature depolarization: Secondary | ICD-10-CM

## 2022-11-08 NOTE — Assessment & Plan Note (Signed)
History of PVCs in the past on low-dose beta-blocker exacerbated by stressful situations.

## 2022-11-08 NOTE — Patient Instructions (Signed)
Medication Instructions:  Your physician recommends that you continue on your current medications as directed. Please refer to the Current Medication list given to you today.  *If you need a refill on your cardiac medications before your next appointment, please call your pharmacy*    Follow-Up: At [facility], you and your health needs are our priority.  As part of our continuing mission to provide you with exceptional heart care, we have created designated Provider Care Teams.  These Care Teams include your primary Cardiologist (physician) and Advanced Practice Providers (APPs -  Physician Assistants and Nurse Practitioners) who all work together to provide you with the care you need, when you need it.  We recommend signing up for the patient portal called "MyChart".  Sign up information is provided on this After Visit Summary.  MyChart is used to connect with patients for Virtual Visits (Telemedicine).  Patients are able to view lab/test results, encounter notes, upcoming appointments, etc.  Non-urgent messages can be sent to your provider as well.   To learn more about what you can do with MyChart, go to [email or web address].    Your next appointment:   6 month(s)  Provider:   Micah Flesher, PA-C, Marjie Skiff, PA-C, Juanda Crumble, PA-C, Joni Reining, DNP, ANP, or Bernadene Person, NP      Then, Nanetta Batty, MD  will plan to see you again in 12 month(s).

## 2022-11-08 NOTE — Assessment & Plan Note (Signed)
History of hyperlipidemia on high-dose rosuvastatin with lipid profile performed 5/24 revealing total cholesterol 136, LDL 67 and HDL 51.

## 2022-11-08 NOTE — Assessment & Plan Note (Signed)
Coronary calcium score performed 03/08/2021 was 78 majority of which was in the LAD territory.  She is completely asymptomatic.  Her LDL is at goal for secondary prevention.

## 2022-11-08 NOTE — Progress Notes (Signed)
11/08/2022 Jasmin Stewart   [birth year]  [id number]  Primary Physician Jasmin Stewart Primary Cardiologist: Jasmin Gess MD Jasmin Stewart, Jasmin Stewart  HPI:  Jasmin Stewart is a 57 y.o.    mildly overweight female mother of 4, grandmother of 3 grandchildren works at [employer] in [employer] .  I last saw her in the office 02/11/2021.  Her cardiac risk factors include 10-20-pack-year history of tobacco abuse currently smoking one third pack per day but otherwise negative. She did have episode of palpitations back in May 2012 and had a Holter monitor that showed PVCs. She was on metoprolol for 6 months which was then discontinued. She recently had sinusitis and pneumonia was treated with bronchodilators and pseudoephedrine. She did notice some palpitations similar to her prior PVCs over these have since resolved and the left several weeks.   Since I saw her a year and a half ago she has had increased frequency of her palpitations recently.  She denies chest pain or shortness of breath.  She continues to smoke a half a pack a day.  She denies chest pain or shortness of breath.  She did have a coronary calcium score performed 03/08/2021 which was 78 all of which was in the LAD territory.  Based on this I increase her rosuvastatin from 10 to 40 mg a day with resulting significant improvement in her lipid profile which was performed 5/24 revealing total cholesterol 136, LDL 67 and HDL 51.   Current Meds  Medication Sig   acetaminophen (TYLENOL) 325 MG tablet Take 2 tablets (650 mg total) by mouth every 6 (six) hours as needed for up to 30 doses for moderate pain or mild pain.   diclofenac Sodium (VOLTAREN) 1 % GEL Apply 4 g topically 4 (four) times daily.   esomeprazole (NEXIUM) 20 MG capsule Take 20 mg by mouth daily as needed.    lidocaine (LIDODERM) 5 % Place 1 patch onto the skin daily. Remove & Discard patch within 12 hours or as directed by MD   metoprolol tartrate  (LOPRESSOR) 25 MG tablet TAKE 1/2 TABLET(12.5 MG) BY MOUTH TWICE DAILY   nitroGLYCERIN (NITRODUR - DOSED IN MG/24 HR) 0.2 mg/hr patch CUT AND APPLY 1/4 PATCH TO MOIST PAINFUL AREA EVERY 24 HOURS   rosuvastatin (CRESTOR) 40 MG tablet TAKE 1 TABLET BY MOUTH DAILY     No Known Allergies  Social History   Socioeconomic History   Marital status: Single    Spouse name: Not on file   Number of children: Not on file   Years of education: Not on file   Highest education level: Not on file  Occupational History   Not on file  Tobacco Use   Smoking status: Every Day    Current packs/day: 7.00    Average packs/day: 7.0 packs/day for 36.0 years (252.0 ttl pk-yrs)    Types: Cigarettes   Smokeless tobacco: Never  Vaping Use   Vaping status: Former  Substance and Sexual Activity   Alcohol use: Not Currently    Alcohol/week: 0.0 standard drinks of alcohol   Drug use: No   Sexual activity: Not Currently    Birth control/protection: Post-menopausal    Comment: 1st intercourse 57 yo-More than 5 partners  Other Topics Concern   Not on file  Social History Narrative   Not on file   Social Determinants of Health   Financial Resource Strain: Not on file  Food Insecurity: Not on file  Transportation Needs: Not on file  Physical Activity: Not on file  Stress: Not on file  Social Connections: Unknown (08/21/2021)   Received [facility]   Social Network    Social Network: Not on file  Intimate Partner Violence: Unknown (07/13/2021)   Received from [facility]   HITS    Physically Hurt: Not on file    Insult or Talk Down To: Not on file    Threaten Physical Harm: Not on file    Scream or Curse: Not on file     Review of Systems: General: negative for chills, fever, night sweats or weight changes.  Cardiovascular: negative for chest pain, dyspnea on exertion, edema, orthopnea, palpitations, paroxysmal nocturnal dyspnea or shortness of breath Dermatological: negative for  rash Respiratory: negative for cough or wheezing Urologic: negative for hematuria Abdominal: negative for nausea, vomiting, diarrhea, bright red blood per rectum, melena, or hematemesis Neurologic: negative for visual changes, syncope, or dizziness All other systems reviewed and are otherwise negative except as noted above.    Blood pressure 106/72, pulse 78, height 5\' 6"  (1.676 m), weight 239 lb 9.6 oz (108.7 kg), last menstrual period 08/22/2017, SpO2 94%.  General appearance: alert and no distress Neck: no adenopathy, no carotid bruit, no JVD, supple, symmetrical, trachea midline, and thyroid not enlarged, symmetric, no tenderness/mass/nodules Lungs: clear to auscultation bilaterally Heart: regular rate and rhythm, S1, S2 normal, no murmur, click, rub or gallop Extremities: extremities normal, atraumatic, no cyanosis or edema Pulses: 2+ and symmetric Skin: Skin color, texture, turgor normal. No rashes or lesions Neurologic: Grossly normal  EKG EKG Interpretation Date/Time:  Wednesday November 08 2022 16:33:06 EDT Ventricular Rate:  76 PR Interval:  120 QRS Duration:  94 QT Interval:  388 QTC Calculation: 436 R Axis:   47  Text Interpretation: Normal sinus rhythm Normal ECG When compared with ECG of 28-Apr-2022 15:36, PREVIOUS ECG IS PRESENT Confirmed by Jasmin Stewart [phone]) on 11/08/2022 4:38:13 PM    ASSESSMENT AND PLAN:   PVC's (premature ventricular contractions) History of PVCs in the past on low-dose beta-blocker exacerbated by stressful situations.  Hyperlipidemia History of hyperlipidemia on high-dose rosuvastatin with lipid profile performed 5/24 revealing total cholesterol 136, LDL 67 and HDL 51.  Elevated coronary artery calcium score Coronary calcium score performed 03/08/2021 was 78 majority of which was in the LAD territory.  She is completely asymptomatic.  Her LDL is at goal for secondary prevention.     Jasmin Gess MD FACP,FACC,FAHA,  [facility] 11/08/2022 4:54 PM

## 2022-11-28 ENCOUNTER — Ambulatory Visit: Payer: BC Managed Care – PPO | Admitting: Nurse Practitioner

## 2022-11-28 ENCOUNTER — Encounter: Payer: Self-pay | Admitting: Medical

## 2022-11-28 ENCOUNTER — Encounter: Payer: Self-pay | Admitting: Nurse Practitioner

## 2022-11-28 ENCOUNTER — Encounter: Payer: Self-pay | Admitting: Family Medicine

## 2022-11-28 VITALS — BP 110/70 | HR 68 | Wt 236.0 lb

## 2022-11-28 DIAGNOSIS — N898 Other specified noninflammatory disorders of vagina: Secondary | ICD-10-CM | POA: Diagnosis not present

## 2022-11-28 DIAGNOSIS — N760 Acute vaginitis: Secondary | ICD-10-CM

## 2022-11-28 DIAGNOSIS — Z113 Encounter for screening for infections with a predominantly sexual mode of transmission: Secondary | ICD-10-CM | POA: Diagnosis not present

## 2022-11-28 DIAGNOSIS — A599 Trichomoniasis, unspecified: Secondary | ICD-10-CM | POA: Diagnosis not present

## 2022-11-28 DIAGNOSIS — B9689 Other specified bacterial agents as the cause of diseases classified elsewhere: Secondary | ICD-10-CM

## 2022-11-28 LAB — WET PREP FOR TRICH, YEAST, CLUE

## 2022-11-28 MED ORDER — METRONIDAZOLE 500 MG PO TABS: 500.0000 mg | ORAL_TABLET | Freq: Two times a day (BID) | ORAL | 0 refills | Status: DC

## 2022-11-28 NOTE — Progress Notes (Signed)
   Acute Office Visit  Subjective:    Patient ID: Jasmin Stewart, female    DOB: 05/07/1965, 57 y.o.   MRN: [id number]   HPI 57 y.o. presents today for vaginal itching and discharge. Symptoms started a few weeks ago, took OTC monistat, symptoms resolved then returned. Would like STD screening today. Last sexual encounter 10/24/22.   Patient's last menstrual period was 08/22/2017.    Review of Systems  Constitutional: Negative.   Genitourinary:  Positive for vaginal discharge and vaginal pain (Itching).       Objective:    Physical Exam Constitutional:      Appearance: Normal appearance.  Genitourinary:    General: Normal vulva.     Vagina: Vaginal discharge and erythema present.     Cervix: Normal.     BP 110/70   Pulse 68   Wt 236 lb (107 kg)   LMP 08/22/2017 Comment: patient has some spotting last spotting was in January   SpO2 100%   BMI 38.09 kg/m  Wt Readings from Last 3 Encounters:  11/28/22 236 lb (107 kg)  11/08/22 239 lb 9.6 oz (108.7 kg)  09/20/22 237 lb 12.8 oz (107.9 kg)        Patient informed chaperone available to be present for breast and/or pelvic exam. Patient has requested no chaperone to be present. Patient has been advised what will be completed during breast and pelvic exam.   Wet prep + trich, + clue cells  Assessment & Plan:   Problem List Items Addressed This Visit   None Visit Diagnoses     Trichomonas infection    -  Primary   Relevant Medications   metroNIDAZOLE (FLAGYL) 500 MG tablet   Vaginal itching       Relevant Orders   WET PREP FOR TRICH, YEAST, CLUE   Screening examination for STD (sexually transmitted disease)       Relevant Orders   C. trachomatis/N. gonorrhoeae RNA   Bacterial vaginosis       Relevant Medications   metroNIDAZOLE (FLAGYL) 500 MG tablet      Plan: Wet prep + BV, trich. Flagyl 500 mg BID x 7 days. Return in 4 weeks for TOC. Inform partner/s, no intercourse for 7 days after completing treatment.  GC/CT pending.      Olivia Mackie DNP, 9:22 AM 11/28/2022

## 2022-11-29 LAB — C. TRACHOMATIS/N. GONORRHOEAE RNA
C. trachomatis RNA, TMA: NOT DETECTED
N. gonorrhoeae RNA, TMA: NOT DETECTED

## 2022-12-26 ENCOUNTER — Ambulatory Visit: Payer: BC Managed Care – PPO | Admitting: Family Medicine

## 2022-12-26 ENCOUNTER — Ambulatory Visit: Payer: BC Managed Care – PPO | Admitting: Nurse Practitioner

## 2022-12-26 ENCOUNTER — Encounter: Payer: Self-pay | Admitting: Nurse Practitioner

## 2022-12-26 ENCOUNTER — Encounter: Payer: Self-pay | Admitting: Family Medicine

## 2022-12-26 ENCOUNTER — Ambulatory Visit: Payer: Self-pay

## 2022-12-26 VITALS — BP 110/76 | HR 59 | Ht 66.0 in | Wt 231.0 lb

## 2022-12-26 VITALS — BP 118/70 | HR 88 | Wt 231.1 lb

## 2022-12-26 DIAGNOSIS — M79661 Pain in right lower leg: Secondary | ICD-10-CM

## 2022-12-26 DIAGNOSIS — A599 Trichomoniasis, unspecified: Secondary | ICD-10-CM | POA: Diagnosis not present

## 2022-12-26 NOTE — Patient Instructions (Signed)
Thank you for coming in today.   Continue compression.  Compression stockings are good.   Continue normal activity.   Recheck as needed.

## 2022-12-26 NOTE — Progress Notes (Signed)
Acute Office Visit  Subjective:    Patient ID: Jasmin Stewart, female    DOB: 1965-09-23, 57 y.o.   MRN: [id number]   HPI 57 y.o. presents today for TOC. + trich 11/28/22. Completed full course of antibiotics. No symptoms. Has not been sexually active since.   Patient's last menstrual period was 08/22/2017.    Review of Systems  Constitutional: Negative.   Genitourinary: Negative.        Objective:    Physical Exam Constitutional:      Appearance: Normal appearance.  Genitourinary:    General: Normal vulva.     Vagina: Normal.     Cervix: Normal.     BP 118/70   Pulse 88   Wt 231 lb 1.6 oz (104.8 kg)   LMP 08/22/2017 Comment: patient has some spotting last spotting was in January   SpO2 100%   BMI 37.30 kg/m  Wt Readings from Last 3 Encounters:  12/26/22 231 lb 1.6 oz (104.8 kg)  12/26/22 231 lb (104.8 kg)  11/28/22 236 lb (107 kg)        Patient informed chaperone available to be present for breast and/or pelvic exam. Patient has requested no chaperone to be present. Patient has been advised what will be completed during breast and pelvic exam.   Assessment & Plan:   Problem List Items Addressed This Visit   None Visit Diagnoses     Trichomonas infection    -  Primary   Relevant Orders   SURESWAB CT/NG/T. vaginalis      Plan: GC/CT/trich pending.      Olivia Mackie DNP, 2:06 PM 12/26/2022 T

## 2022-12-26 NOTE — Progress Notes (Signed)
   I, [name], CMA acting as a scribe for Clementeen Graham, MD.  Jasmin Stewart is a 57 y.o. female who presents to [facility] today for R lower leg pain. Pt was previously seen by Dr. Denyse Amass on 09/20/22 for R foot pain.  Today, pt c/o R lower leg pain ongoing since July 16th. She slipped in the bathtub and hit her R lower leg on the faucet. Pt locates pain to the lower anterior aspect of the right leg. Notes palpable mass, ttp. Minimal change in size since onset.   Swelling: yes LE numbness/tingling: no Aggravates: palpation Treatments tried: ice  She is able to do normal activities including walking and exercising without pain.  She is returned to normal she does wants to make sure that her leg is healing appropriately.  Pertinent review of systems: No fevers or chills  Relevant historical information: Hyperlipidemia   Exam:  BP 110/76   Pulse (!) 59   Ht 5\' 6"  (1.676 m)   Wt 231 lb (104.8 kg)   LMP 08/22/2017 Comment: patient has some spotting last spotting was in January   SpO2 98%   BMI 37.28 kg/m  General: Well Developed, well nourished, and in no acute distress.   MSK: Right anterior lower leg some swelling present at anterior distal shin.  Mildly tender palpation this region.  Normal foot and ankle motion.  Intact strength.    Lab and Radiology Results  Diagnostic Limited MSK Ultrasound of: Anterior lower leg Area of swelling and firmness palpated and viewed with ultrasound.  Slight hypoechoic change with subcutaneous tissue present in this region looks to be a resolving hematoma.  Bone cortex and soft tissue otherwise unremarkable. Impression: Resolving hematoma right lower leg      Assessment and Plan: 57 y.o. female with right lower leg hematoma is improving.  Clinically she is doing great.  Plan for adding a little bit of compression and continuing home exercise program and check back as needed.   PDMP not reviewed this  encounter. Orders Placed This Encounter  Procedures   [place] LIMITED JOINT SPACE STRUCTURES LOW RIGHT(NO LINKED CHARGES)    Order Specific Question:   Reason for Exam (SYMPTOM  OR DIAGNOSIS REQUIRED)    Answer:   right lower leg pain    Order Specific Question:   Preferred imaging location?    Answer:   [facility]   No orders of the defined types were placed in this encounter.    Discussed warning signs or symptoms. Please see discharge instructions. Patient expresses understanding.   The above documentation has been reviewed and is accurate and complete Clementeen Graham, M.D.

## 2023-01-13 ENCOUNTER — Other Ambulatory Visit: Payer: Self-pay | Admitting: Family Medicine

## 2023-01-13 DIAGNOSIS — M79671 Pain in right foot: Secondary | ICD-10-CM

## 2023-01-15 NOTE — Telephone Encounter (Signed)
Rx refill request approved per Dr. Corey's orders. 

## 2023-01-25 DIAGNOSIS — Z1231 Encounter for screening mammogram for malignant neoplasm of breast: Secondary | ICD-10-CM | POA: Diagnosis not present

## 2023-01-25 DIAGNOSIS — R92323 Mammographic fibroglandular density, bilateral breasts: Secondary | ICD-10-CM | POA: Diagnosis not present

## 2023-02-01 DIAGNOSIS — E559 Vitamin D deficiency, unspecified: Secondary | ICD-10-CM | POA: Diagnosis not present

## 2023-02-01 DIAGNOSIS — Z719 Counseling, unspecified: Secondary | ICD-10-CM | POA: Diagnosis not present

## 2023-02-01 DIAGNOSIS — R7301 Impaired fasting glucose: Secondary | ICD-10-CM | POA: Diagnosis not present

## 2023-02-01 DIAGNOSIS — Z720 Tobacco use: Secondary | ICD-10-CM | POA: Diagnosis not present

## 2023-03-21 ENCOUNTER — Encounter: Payer: Self-pay | Admitting: Nurse Practitioner

## 2023-03-21 ENCOUNTER — Encounter: Payer: Self-pay | Admitting: Family Medicine

## 2023-03-21 ENCOUNTER — Encounter: Payer: Self-pay | Admitting: Medical

## 2023-03-21 ENCOUNTER — Encounter: Payer: Self-pay | Admitting: Internal Medicine

## 2023-03-21 NOTE — Telephone Encounter (Signed)
This has been updated.

## 2023-05-11 ENCOUNTER — Ambulatory Visit: Payer: BC Managed Care – PPO | Admitting: Family

## 2023-05-11 VITALS — BP 123/73 | HR 89 | Temp 98.5°F | Resp 16 | Ht 66.0 in | Wt 229.0 lb

## 2023-05-11 DIAGNOSIS — J069 Acute upper respiratory infection, unspecified: Secondary | ICD-10-CM

## 2023-05-11 DIAGNOSIS — J029 Acute pharyngitis, unspecified: Secondary | ICD-10-CM

## 2023-05-11 DIAGNOSIS — R509 Fever, unspecified: Secondary | ICD-10-CM

## 2023-05-11 LAB — POCT INFLUENZA A/B
Influenza A, POC: NEGATIVE
Influenza B, POC: NEGATIVE

## 2023-05-11 LAB — POC COVID19 BINAXNOW: SARS Coronavirus 2 Ag: NEGATIVE

## 2023-05-11 NOTE — Patient Instructions (Signed)
VISIT SUMMARY:  You came in today with upper respiratory symptoms that started on Tuesday, including a sore throat, nasal drip, congestion, body aches, and fatigue. Despite self-treatment with Mucinex and increased fluids, your symptoms have persisted. You also mentioned a possible fever and a change in your voice. A home COVID test was negative, but you are unsure of its accuracy.   YOUR PLAN:  -UPPER RESPIRATORY INFECTION: An upper respiratory infection is a viral infection that affects the nose, throat, and airways. We performed rapid COVID and flu tests to determine the cause. Both tests are negative, Please continue using Mucinex, stay hydrated, and get plenty of rest. If the tests are positive, we will follow the appropriate treatment guidelines for COVID or influenza.  -INFLUENZA VACCINATION STATUS: You have not received this year's flu vaccine. It is important to get an annual flu shot to help prevent future infections. Please consider getting the flu vaccine once you have recovered from your current illness.  INSTRUCTIONS:  We will perform rapid COVID and flu tests today. Please follow up with [place] for the results and further instructions based on the outcomes of these tests.

## 2023-05-11 NOTE — Progress Notes (Signed)
Subjective:     Patient ID: Jasmin Stewart, female    DOB: Aug 19, 1965, 58 y.o.   MRN: [id number]  Chief Complaint  Patient presents with   Nasal Congestion    Patient complains of congestion for 3 days   Generalized Body Aches    Complains of body aches for 2 days ago   Sore Throat    Complains of sore throat     HPI  Discussed the use of AI scribe software for clinical note transcription with the patient, who gave verbal consent to proceed.  History of Present Illness   The patient is a 58 year old female who presents with upper respiratory symptoms and possible flu.  She has been experiencing upper respiratory symptoms since Tuesday morning, beginning with a sore throat and nasal drip. Her symptoms progressed to include nasal congestion, body aches, and fatigue. By Tuesday evening, she felt achy and went to bed early. On Wednesday, her condition worsened, with significant body aches and congestion, prompting her to stay home from work.  She began self-treatment with Mucinex 1200 mg and increased her fluid intake, including orange juice. By Thursday, she noted some improvement in body aches but continued to experience congestion and a possible fever, though she was unsure due to experiencing clamminess and joint discomfort. She continued to take Mucinex and rest.  On Friday, she experienced a lack of appetite, persistent congestion, and glandular achiness. She also noted a change in her voice, stating she 'sounds like a dude.' She performed a home COVID test, which was negative, but questions its accuracy.  She mentions potential exposure to illness from a coworker who appeared symptomatic earlier in the week. Additionally, her granddaughter, whom she has not been in contact with recently, is also sick with epistaxis and fever.          Health Maintenance Due  Topic Date Due   Pneumococcal Vaccine 94-35 Years old (1 of 2 - PCV) Never done   DTaP/Tdap/Td (1 - Tdap) Never done    Zoster Vaccines- Shingrix (2 of 2) 02/06/2021   MAMMOGRAM  01/28/2022   Lung Cancer Screening  04/16/2022   INFLUENZA VACCINE  Never done   COVID-19 Vaccine (5 - 2024-25 season) 12/10/2022    Past Medical History:  Diagnosis Date   Abdominal hernia    Anemia    Asthma    Fibroid, uterine    never had surgery   History of blood transfusion 1991; 1995   related to childbirth   History of chicken pox    Age 78   Hypercholesteremia    Hypoglycemia    Hypokalemia    Hypotension    Pneumonia 2013,2014, 2018   PVC (premature ventricular contraction)    Ulcer of esophagus     Past Surgical History:  Procedure Laterality Date   ABDOMINAL HERNIA REPAIR  2007; 06/05/2017   Suprapubic, Surgery 2007 with mesh VHR w/mesh; OPEN VENTRAL HERNIA REPAIR WITH MESH   CESAREAN SECTION  1991; 1995; 1998   COLONOSCOPY  05/2022   ESOPHAGOGASTRODUODENOSCOPY  01/2017   HERNIA REPAIR  2007; 06/05/2017   INSERTION OF MESH N/A 06/05/2017   Procedure: INSERTION OF MESH;  Surgeon: Manus Rudd, MD;  Location: [facility] OR;  Service: General;  Laterality: N/A;   VENTRAL HERNIA REPAIR N/A 06/05/2017   Procedure: OPEN VENTRAL HERNIA REPAIR WITH MESH;  Surgeon: Manus Rudd, MD;  Location: [facility] OR;  Service: General;  Laterality: N/A;    Family History  Problem Relation  Age of Onset   Thyroid disease Mother    Atrial fibrillation Mother    Hyperlipidemia Mother    Hyperlipidemia Sister    Breast cancer Sister    Hyperlipidemia Brother    Thyroid disease Maternal Grandmother    Glaucoma Maternal Grandmother    Cataracts Maternal Grandmother    Esophageal cancer Maternal Grandfather    Hypertension Maternal Uncle    Obesity Maternal Uncle    Allergies Son        Hay Fever   Healthy Daughter    Colon cancer Neg Hx    Colon polyps Neg Hx    Stomach cancer Neg Hx    Rectal cancer Neg Hx     Social History   Socioeconomic History   Marital status: Single    Spouse name: Not on file   Number of  children: Not on file   Years of education: Not on file   Highest education level: Not on file  Occupational History   Not on file  Tobacco Use   Smoking status: Every Day    Current packs/day: 7.00    Average packs/day: 7.0 packs/day for 36.0 years (252.0 ttl pk-yrs)    Types: Cigarettes   Smokeless tobacco: Never  Vaping Use   Vaping status: Former  Substance and Sexual Activity   Alcohol use: Not Currently    Alcohol/week: 0.0 standard drinks of alcohol   Drug use: No   Sexual activity: Not Currently    Birth control/protection: Post-menopausal    Comment: 1st intercourse 58 yo-More than 5 partners  Other Topics Concern   Not on file  Social History Narrative   Not on file   Social Drivers of Health   Financial Resource Strain: Medium Risk (05/11/2023)   Overall Financial Resource Strain (CARDIA)    Difficulty of Paying Living Expenses: Somewhat hard  Food Insecurity: No Food Insecurity (05/11/2023)   Hunger Vital Sign    Worried About Running Out of Food in the Last Year: Never true    Ran Out of Food in the Last Year: Never true  Transportation Needs: No Transportation Needs (05/11/2023)   PRAPARE - Administrator, Civil Service (Medical): No    Lack of Transportation (Non-Medical): No  Physical Activity: Insufficiently Active (05/11/2023)   Exercise Vital Sign    Days of Exercise per Week: 3 days    Minutes of Exercise per Session: 30 min  Stress: No Stress Concern Present (05/11/2023)   [employer] of Occupational Health - Occupational Stress Questionnaire    Feeling of Stress : Not at all  Social Connections: Socially Isolated (05/11/2023)   Social Connection and Isolation Panel [NHANES]    Frequency of Communication with Friends and Family: Once a week    Frequency of Social Gatherings with Friends and Family: Once a week    Attends Religious Services: Never    Database administrator or Organizations: No    Attends Engineer, structural:  Not on file    Marital Status: Never married  Intimate Partner Violence: Unknown (07/13/2021)   Received from [facility]   HITS    Physically Hurt: Not on file    Insult or Talk Down To: Not on file    Threaten Physical Harm: Not on file    Scream or Curse: Not on file    Outpatient Medications Prior to Visit  Medication Sig Dispense Refill   acetaminophen (TYLENOL) 325 MG tablet Take 2 tablets (650  mg total) by mouth every 6 (six) hours as needed for up to 30 doses for moderate pain or mild pain. 30 tablet 0   diclofenac Sodium (VOLTAREN) 1 % GEL Apply 4 g topically 4 (four) times daily. 150 g 0   esomeprazole (NEXIUM) 20 MG capsule Take 20 mg by mouth daily as needed.      lidocaine (LIDODERM) 5 % Place 1 patch onto the skin daily. Remove & Discard patch within 12 hours or as directed by MD 30 patch 0   metoprolol tartrate (LOPRESSOR) 25 MG tablet TAKE 1/2 TABLET(12.5 MG) BY MOUTH TWICE DAILY 30 tablet 1   nitroGLYCERIN (NITRODUR - DOSED IN MG/24 HR) 0.2 mg/hr patch CUT AND APPLY 1/4 PATCH TO MOIST PAINFUL AREA EVERY 24 HOURS 30 patch 0   rosuvastatin (CRESTOR) 40 MG tablet TAKE 1 TABLET BY MOUTH DAILY 90 tablet 3   No facility-administered medications prior to visit.    No Known Allergies  ROS    See HPI Objective:    Physical Exam Constitutional:      General: She is not in acute distress.    Appearance: Normal appearance. She is well-developed.  HENT:     Head: Normocephalic and atraumatic.     Right Ear: Tympanic membrane, ear canal and external ear normal.     Left Ear: Tympanic membrane, ear canal and external ear normal.     Mouth/Throat:     Pharynx: No posterior oropharyngeal erythema.     Tonsils: No tonsillar exudate or tonsillar abscesses. 2+ on the right. 2+ on the left.  Eyes:     General: No scleral icterus. Neck:     Thyroid: No thyromegaly.     Comments: Tender anterior cervical LAD Cardiovascular:     Rate and Rhythm: Normal rate and  regular rhythm.     Heart sounds: Normal heart sounds. No murmur heard. Pulmonary:     Effort: Pulmonary effort is normal. No respiratory distress.     Breath sounds: Normal breath sounds. No wheezing.  Musculoskeletal:     Cervical back: Neck supple.  Skin:    General: Skin is warm and dry.  Neurological:     Mental Status: She is alert and oriented to person, place, and time.  Psychiatric:        Mood and Affect: Mood normal.        Behavior: Behavior normal.        Thought Content: Thought content normal.        Judgment: Judgment normal.      BP 123/73 (BP Location: Right Arm, Patient Position: Sitting, Cuff Size: Large)   Pulse 89   Temp 98.5 F (36.9 C) (Oral)   Resp 16   Ht 5\' 6"  (1.676 m)   Wt 229 lb (103.9 kg)   LMP 08/22/2017 Comment: patient has some spotting last spotting was in January   SpO2 98%   BMI 36.96 kg/m  Wt Readings from Last 3 Encounters:  05/11/23 229 lb (103.9 kg)  12/26/22 231 lb 1.6 oz (104.8 kg)  12/26/22 231 lb (104.8 kg)       Assessment & Plan:   Problem List Items Addressed This Visit       Unprioritized   Viral URI   Covid and flu shots swabs are negative.  Suspect other viral illness.  Recommend supportive measures, mucinex prn, fluids/rest. Call if new/worsening symptoms or if not improved in 3-4 days. Pt verbalizes understanding.      Other Visit  Diagnoses       Fever, unspecified fever cause    -  Primary   Relevant Orders   POCT Influenza A/B (Completed)     Sore throat       Relevant Orders   POC COVID-19 (Completed)       I am having [employer]. Janak maintain her esomeprazole, lidocaine, diclofenac Sodium, acetaminophen, rosuvastatin, metoprolol tartrate, and nitroGLYCERIN.  No orders of the defined types were placed in this encounter.

## 2023-05-11 NOTE — Assessment & Plan Note (Signed)
Covid and flu shots swabs are negative.  Suspect other viral illness.  Recommend supportive measures, mucinex prn, fluids/rest. Call if new/worsening symptoms or if not improved in 3-4 days. Pt verbalizes understanding.

## 2023-05-12 ENCOUNTER — Encounter: Payer: Self-pay | Admitting: Cardiovascular Disease

## 2023-05-14 ENCOUNTER — Other Ambulatory Visit: Payer: Self-pay

## 2023-05-14 ENCOUNTER — Other Ambulatory Visit (HOSPITAL_BASED_OUTPATIENT_CLINIC_OR_DEPARTMENT_OTHER): Payer: Self-pay

## 2023-05-14 DIAGNOSIS — I493 Ventricular premature depolarization: Secondary | ICD-10-CM

## 2023-05-14 MED ORDER — METOPROLOL TARTRATE 25 MG PO TABS
12.5000 mg | ORAL_TABLET | Freq: Two times a day (BID) | ORAL | 1 refills | Status: DC
Start: 2023-05-14 — End: 2023-06-22
  Filled 2023-05-14: qty 30, 30d supply, fill #0

## 2023-05-29 ENCOUNTER — Encounter: Payer: Self-pay | Admitting: Cardiovascular Disease

## 2023-06-21 NOTE — Progress Notes (Signed)
 Cardiology Office Note    Date:  06/23/2023  ID:  Jasmin Stewart, DOB 03-02-1966, MRN [id number] PCP:  Esperanza Richters, PA-C  Cardiologist:  Nanetta Batty, MD  Electrophysiologist:  None   Chief Complaint: Follow up for elevated calcium score   History of Present Illness: Jasmin Stewart is a 58 y.o. female with visit-pertinent history of hyperlipidemia, elevated coronary artery calcium score, tobacco use and PVCs. Patient has been followed by Dr. Allyson Sabal.  In May 2012 patient had episode of palpitations, Holter monitor showed PVCs.  She was on metoprolol for 6 months, this was later discontinued.  In 02/2021 she had a coronary calcium score performed that was 78 all of which was in the LAD territory, Dr. Allyson Sabal increased or rosuvastatin to 40 mg daily with significant improvement in her lipid profile.  Patient was last in clinic on 11/08/2022.  She had noted increased frequency of her palpitations.  Denied chest pain or shortness of breath.  Today she presents for follow-up.  She reports that she has been doing very well. She has no concerns or complaints. She has significantly improved her diet and is working to increase her exercise.  She has been doing intermittent fasting and has slowly been increasing her exercise.  She notes that she has recently been losing weight.  Reports compliance with all medications. ROS: .   Today she denies chest pain, shortness of breath, lower extremity edema, fatigue, palpitations, melena, hematuria, hemoptysis, diaphoresis, weakness, presyncope, syncope, orthopnea, and PND.  All other systems are reviewed and otherwise negative. Studies Reviewed: Marland Kitchen    EKG:  EKG is ordered today, personally reviewed, demonstrating  EKG Interpretation Date/Time:  Friday June 22 2023 15:23:47 EDT Ventricular Rate:  60 PR Interval:  124 QRS Duration:  100 QT Interval:  436 QTC Calculation: 436 R Axis:   46  Text Interpretation: Normal sinus rhythm Normal ECG When  compared with ECG of 08-Nov-2022 16:33, No significant change was found Confirmed by Reather Littler [phone]) on 06/22/2023 3:27:40 PM   CV Studies: Cardiac studies reviewed are outlined and summarized above. Otherwise please see EMR for full report. Cardiac Studies & Procedures   ______________________________________________________________________________________________        Jasmin Stewart  LONG TERM MONITOR (3-14 DAYS) 03/08/2021  Narrative Patch Wear Time:  6 days and 7 hours (2022-11-07T21:02:03-499 to 2022-11-14T04:42:31-0500)  Patient had a min HR of 45 bpm, max HR of 133 bpm, and avg HR of 79 bpm. Predominant underlying rhythm was Sinus Rhythm. Isolated SVEs were rare (<1.0%), SVE Couplets were rare (<1.0%), and no SVE Triplets were present. Isolated VEs were rare (<1.0%), and no VE Couplets or VE Triplets were present. Inverted QRS complexes possibly due to inverted placement of device.  1. SR/SB/ST 2. Occasional PACs and PVCs   CT SCANS  CT CARDIAC SCORING (SELF PAY ONLY) 03/08/2021  Addendum 03/08/2021  4:09 PM ADDENDUM REPORT: 03/08/2021 16:06  ADDENDUM: Cardiovascular Disease Risk stratification  EXAM: Coronary Calcium Score  TECHNIQUE: A gated, non-contrast computed tomography scan of the heart was performed using 3mm slice thickness. Axial images were analyzed on a dedicated workstation. Calcium scoring of the coronary arteries was performed using the Agatston method.  FINDINGS: Coronary arteries: Normal origins.  Coronary Calcium Score:  Left main: 0  Left anterior descending artery: 78  Left circumflex artery: 0  Right coronary artery: 0  Total: 78  Percentile: 95  Pericardium: Normal.  Ascending Aorta: Normal caliber.  Non-cardiac: See separate report  from [facility].  IMPRESSION: Coronary calcium score of 78. This was 95 percentile for age-, race-, and sex-matched controls.  RECOMMENDATIONS: Coronary artery calcium (CAC) score  is a strong predictor of incident coronary heart disease (CHD) and provides predictive information beyond traditional risk factors. CAC scoring is reasonable to use in the decision to withhold, postpone, or initiate statin therapy in intermediate-risk or selected borderline-risk asymptomatic adults (age 34-75 years and LDL-C >=70 to <190 mg/dL) who do not have diabetes or established atherosclerotic cardiovascular disease (ASCVD).* In intermediate-risk (10-year ASCVD risk >=7.5% to <20%) adults or selected borderline-risk (10-year ASCVD risk >=5% to <7.5%) adults in whom a CAC score is measured for the purpose of making a treatment decision the following recommendations have been made:  If CAC=0, it is reasonable to withhold statin therapy and reassess in 5 to 10 years, as long as higher risk conditions are absent (diabetes mellitus, family history of premature CHD in first degree relatives (males <55 years; females <65 years), cigarette smoking, or LDL >=190 mg/dL).  If CAC is 1 to 99, it is reasonable to initiate statin therapy for patients >=40 years of age.  If CAC is >=100 or >=75th percentile, it is reasonable to initiate statin therapy at any age.  Cardiology referral should be considered for patients with CAC scores >=400 or >=75th percentile.  *2018 AHA/ACC/AACVPR/AAPA/ABC/ACPM/ADA/AGS/APhA/ASPC/NLA/PCNA Guideline on the Management of Blood Cholesterol: A Report of the American College of Cardiology/American Heart Association Task Force on Clinical Practice Guidelines. J Am Coll Cardiol. 2019;73(24):3168-3209.  Thomasene Ripple, DO   Electronically Signed By: Thomasene Ripple D.O. On: 03/08/2021 16:06  Narrative EXAM: OVER-READ INTERPRETATION  CT CHEST  The following report is an over-read performed by radiologist Dr. Jeronimo Greaves of [facility] on 03/08/2021. This over-read does not include interpretation of cardiac or coronary anatomy or pathology. The  calcium score interpretation by the cardiologist is attached.  COMPARISON:  05/27/2019 chest radiograph.  FINDINGS: Vascular: Aortic atherosclerosis.  Mediastinum/Nodes: No imaged thoracic adenopathy.  Lungs/Pleura: No pleural fluid. Posterior right upper lobe 4 mm pulmonary nodule on 06/04.  Upper Abdomen: Normal imaged portions of the liver, spleen, stomach.  Musculoskeletal: No acute osseous abnormality.  IMPRESSION: No acute findings in the imaged extracardiac chest.  Aortic Atherosclerosis (ICD10-I70.0).  Right upper lobe 4 mm pulmonary nodule. No follow-up needed if patient is low-risk. Non-contrast chest CT can be considered in 12 months if patient is high-risk. This recommendation follows the consensus statement: Guidelines for Management of Incidental Pulmonary Nodules Detected on CT Images: From the Fleischner Society 2017; Radiology 2017; 284:228-243.  Electronically Signed: By: Jeronimo Greaves M.D. On: 03/08/2021 09:07     ______________________________________________________________________________________________       Current Reported Medications:.    Current Meds  Medication Sig   acetaminophen (TYLENOL) 325 MG tablet Take 2 tablets (650 mg total) by mouth every 6 (six) hours as needed for up to 30 doses for moderate pain or mild pain.   diclofenac Sodium (VOLTAREN) 1 % GEL Apply 4 g topically 4 (four) times daily.   esomeprazole (NEXIUM) 20 MG capsule Take 20 mg by mouth daily as needed.    lidocaine (LIDODERM) 5 % Place 1 patch onto the skin daily. Remove & Discard patch within 12 hours or as directed by MD   nitroGLYCERIN (NITRODUR - DOSED IN MG/24 HR) 0.2 mg/hr patch CUT AND APPLY 1/4 PATCH TO MOIST PAINFUL AREA EVERY 24 HOURS   rosuvastatin (CRESTOR) 40 MG tablet TAKE 1 TABLET BY  MOUTH DAILY   [DISCONTINUED] metoprolol tartrate (LOPRESSOR) 25 MG tablet Take 0.5 tablets (12.5 mg total) by mouth 2 (two) times daily.    Physical Exam:    VS:  BP  120/82   Pulse 60   Resp 17   Ht 5\' 6"  (1.676 m)   Wt 225 lb 12.8 oz (102.4 kg)   LMP 08/22/2017 Comment: patient has some spotting last spotting was in January   SpO2 99%   BMI 36.45 kg/m    Wt Readings from Last 3 Encounters:  06/22/23 225 lb 12.8 oz (102.4 kg)  05/11/23 229 lb (103.9 kg)  12/26/22 231 lb 1.6 oz (104.8 kg)    GEN: Well nourished, well developed in no acute distress NECK: No JVD; No carotid bruits CARDIAC: RRR, no murmurs, rubs, gallops RESPIRATORY:  Clear to auscultation without rales, wheezing or rhonchi  ABDOMEN: Soft, non-tender, non-distended EXTREMITIES:  No edema; No acute deformity     Asessement and Plan:.    PVCs: Patient with history of PVCs.  Reports they have been very well-controlled.  Continue metoprolol tartrate 12.5 mg twice daily.  Refill provided.  Hyperlipidemia: Patient with nonfasting lipid profile in 01/2023 which indicated HDL 53, triglycerides 101, total cholesterol 190 and LDL 119.  Patient reports that she is to have fasting labs in April.  She will send a copy of results to office.  Elevated coronary calcium score: In 02/2021 she had a coronary calcium score performed that was 78 all of which was in the LAD territory. Stable with no anginal symptoms. No indication for ischemic evaluation.  Heart healthy diet and regular cardiovascular exercise encouraged.  Patient has been putting in great efforts towards weight loss and has lost close to 15 pounds.  Congratulated and encouraged to continue working.  Continue Crestor 40 mg daily and metoprolol tartrate 12.5 mg twice daily.    Disposition: F/u with Dr. Allyson Sabal in one year or sooner if needed.   Signed, Rip Harbour, NP

## 2023-06-22 ENCOUNTER — Encounter: Payer: Self-pay | Admitting: Cardiology

## 2023-06-22 ENCOUNTER — Ambulatory Visit: Payer: BC Managed Care – PPO | Admitting: Adult Health

## 2023-06-22 ENCOUNTER — Ambulatory Visit: Payer: BC Managed Care – PPO | Attending: Cardiology | Admitting: Cardiology

## 2023-06-22 VITALS — BP 120/82 | HR 60 | Resp 17 | Ht 66.0 in | Wt 225.8 lb

## 2023-06-22 DIAGNOSIS — R931 Abnormal findings on diagnostic imaging of heart and coronary circulation: Secondary | ICD-10-CM

## 2023-06-22 DIAGNOSIS — E782 Mixed hyperlipidemia: Secondary | ICD-10-CM

## 2023-06-22 DIAGNOSIS — I493 Ventricular premature depolarization: Secondary | ICD-10-CM | POA: Diagnosis not present

## 2023-06-22 DIAGNOSIS — R002 Palpitations: Secondary | ICD-10-CM

## 2023-06-22 MED ORDER — METOPROLOL TARTRATE 25 MG PO TABS: 12.5000 mg | ORAL_TABLET | Freq: Two times a day (BID) | ORAL | 3 refills | Status: AC

## 2023-06-22 NOTE — Patient Instructions (Signed)
 Medication Instructions:  No changes *If you need a refill on your cardiac medications before your next appointment, please call your pharmacy*  Lab Work: No labs If you have labs (blood work) drawn today and your tests are completely normal, you will receive your results only by: MyChart Message (if you have MyChart) OR A paper copy in the mail If you have any lab test that is abnormal or we need to change your treatment, we will call you to review the results.  Testing/Procedures: No testing  Follow-Up: At [facility], you and your health needs are our priority.  As part of our continuing mission to provide you with exceptional heart care, we have created designated Provider Care Teams.  These Care Teams include your primary Cardiologist (physician) and Advanced Practice Providers (APPs -  Physician Assistants and Nurse Practitioners) who all work together to provide you with the care you need, when you need it.  We recommend signing up for the patient portal called "MyChart".  Sign up information is provided on this After Visit Summary.  MyChart is used to connect with patients for Virtual Visits (Telemedicine).  Patients are able to view lab/test results, encounter notes, upcoming appointments, etc.  Non-urgent messages can be sent to your provider as well.   To learn more about what you can do with MyChart, go to [email or web address].    Your next appointment:   1 year(s)  Provider:   Nanetta Batty, MD     Other Instructions

## 2023-06-23 ENCOUNTER — Encounter: Payer: Self-pay | Admitting: Cardiology

## 2023-08-22 ENCOUNTER — Ambulatory Visit: Admitting: Family Medicine

## 2023-08-22 ENCOUNTER — Encounter: Payer: Self-pay | Admitting: Family Medicine

## 2023-08-22 ENCOUNTER — Other Ambulatory Visit: Payer: Self-pay

## 2023-08-22 VITALS — BP 110/80 | HR 76 | Ht 66.0 in

## 2023-08-22 DIAGNOSIS — M79661 Pain in right lower leg: Secondary | ICD-10-CM

## 2023-08-22 DIAGNOSIS — M25561 Pain in right knee: Secondary | ICD-10-CM | POA: Diagnosis not present

## 2023-08-22 DIAGNOSIS — G8929 Other chronic pain: Secondary | ICD-10-CM | POA: Diagnosis not present

## 2023-08-22 NOTE — Patient Instructions (Addendum)
 Thank you for coming in today.   I recommend using a light to medium compression stocking.   We've placed an order for a lower extremity doppler to rule out a blood clot.   Please work on the home exercises the athletic trainer went over with you:  View at [email or web address] code [facility]

## 2023-08-22 NOTE — Progress Notes (Signed)
   I, Miquel Amen, CMA acting as a scribe for Garlan Juniper, MD.  Jasmin Stewart is a 58 y.o. female who presents to [facility] at [facility] today for R lower leg pain.  Pt was previously seen by Dr. Alease Hunter on 12/26/22 for a hematoma in her R lower leg. Pain now more in the lower leg/calf   Today, pt c/o R LE pain. Was sitting at work last Wednesday, got up to use the rest room and felt a pain at posterior aspect of the knee. Pain has been getting progressively worse. Causing night disturbance. Worse with knee flexion, driving. Radiating pain into the 5th toe at times, while driving. Has tried Nitroglycerin  Patches, RICE. Has tried ice and ACE wrap. Pain now more in the lower leg/calf. Got the most relief when laying in bed with the leg elevated on pillow. Denies increased warmth, erythema.   Radiates: posterior knee to lower leg Aggravates: descending stairs Treatments tried: Voltaren , Tylenol , IBU  Dx testing: 08/05/22 L-spine MRI  07/26/22 L-spine XR 07/20/22 Vasc US   Pertinent review of systems: No fevers or chills  Relevant historical information: Shoulder pain hyperlipidemia.   Exam:  BP 110/80   Pulse 76   Ht 5\' 6"  (1.676 m)   LMP 08/22/2017 Comment: patient has some spotting last spotting was in January   SpO2 97%   BMI 36.45 kg/m  General: Well Developed, well nourished, and in no acute distress.   MSK: Right calf normal appearing Tender palpation varicosities posterior calf. Normal foot and ankle motion. Intact strength. No palpable cords or erythema.    Lab and Radiology Results  Diagnostic Limited MSK Ultrasound of: Right posterior calf Varicosity present posterior calf.  Superficial veins do appear to be compressible.  No significant hematoma or large fluid is present. Posterior medial knee tiny Baker's cyst with a little bit of drainage into the posterior calf. Impression: Possible draining Baker's cyst or possible SVT around  varicosity.      Assessment and Plan: 58 y.o. female with posterior calf pain.  Etiology is a bit unclear.  Differential includes ruptured Baker's cyst or possible superficial venous thrombosis from varicose vein posterior calf.  Plan for vascular ultrasound to assess more accurately for DVT and SVT.  Plan for compression sleeve and Voltaren  gel.   PDMP not reviewed this encounter. Orders Placed This Encounter  Procedures   US  LIMITED JOINT SPACE STRUCTURES LOW RIGHT(NO LINKED CHARGES)    Reason for Exam (SYMPTOM  OR DIAGNOSIS REQUIRED):   right knee and lower leg pain    Preferred imaging location?:   [facility]   No orders of the defined types were placed in this encounter.    Discussed warning signs or symptoms. Please see discharge instructions. Patient expresses understanding.   The above documentation has been reviewed and is accurate and complete Garlan Juniper, M.D.

## 2023-08-23 ENCOUNTER — Ambulatory Visit (HOSPITAL_COMMUNITY)
Admission: RE | Admit: 2023-08-23 | Discharge: 2023-08-23 | Disposition: A | Discharge status: Home / Self Care | Source: Ambulatory Visit | Attending: Family Medicine | Admitting: Family Medicine

## 2023-08-23 ENCOUNTER — Telehealth: Payer: Self-pay

## 2023-08-23 ENCOUNTER — Ambulatory Visit: Payer: Self-pay | Admitting: Family Medicine

## 2023-08-23 DIAGNOSIS — M79661 Pain in right lower leg: Secondary | ICD-10-CM | POA: Diagnosis not present

## 2023-08-23 NOTE — Telephone Encounter (Signed)
 Vascular lab calling to advise that pt is NEG for DVT.  Dr. Alease Hunter made verbally aware.

## 2023-08-23 NOTE — Progress Notes (Signed)
 No DVT is seen in the leg.  Besides the varicose vein I do not see why your leg hurts based on this report.

## 2023-08-24 ENCOUNTER — Encounter: Payer: Self-pay | Admitting: Family Medicine

## 2023-08-24 DIAGNOSIS — M5416 Radiculopathy, lumbar region: Secondary | ICD-10-CM

## 2023-09-06 ENCOUNTER — Other Ambulatory Visit: Payer: Self-pay | Admitting: Cardiovascular Disease

## 2023-09-06 DIAGNOSIS — E7849 Other hyperlipidemia: Secondary | ICD-10-CM

## 2023-09-07 ENCOUNTER — Encounter: Payer: BC Managed Care – PPO | Admitting: Medical

## 2023-09-07 MED ORDER — PREDNISONE 10 MG PO TABS: 30.0000 mg | ORAL_TABLET | Freq: Every day | ORAL | 0 refills | Status: DC

## 2023-09-10 NOTE — Telephone Encounter (Signed)
 Forwarding to Dr. Denyse Amass to review and advise.

## 2023-09-11 ENCOUNTER — Encounter: Payer: Self-pay | Admitting: Family Medicine

## 2023-09-11 NOTE — Addendum Note (Signed)
 Addended by: Mena Stai on: 09/11/2023 10:50 AM   Modules accepted: Orders

## 2023-09-13 ENCOUNTER — Ambulatory Visit: Admitting: Nurse Practitioner

## 2023-09-14 ENCOUNTER — Ambulatory Visit
Admission: RE | Admit: 2023-09-14 | Discharge: 2023-09-14 | Disposition: A | Discharge status: Home / Self Care | Source: Ambulatory Visit | Attending: Family Medicine | Admitting: Family Medicine

## 2023-09-14 DIAGNOSIS — M5416 Radiculopathy, lumbar region: Secondary | ICD-10-CM

## 2023-09-14 DIAGNOSIS — M2578 Osteophyte, vertebrae: Secondary | ICD-10-CM | POA: Diagnosis not present

## 2023-09-14 DIAGNOSIS — M4726 Other spondylosis with radiculopathy, lumbar region: Secondary | ICD-10-CM | POA: Diagnosis not present

## 2023-09-15 ENCOUNTER — Encounter: Payer: Self-pay | Admitting: Family Medicine

## 2023-09-15 DIAGNOSIS — M79671 Pain in right foot: Secondary | ICD-10-CM

## 2023-09-17 NOTE — Telephone Encounter (Signed)
 Forwarding to Dr. Denyse Amass to review and advise.

## 2023-09-18 MED ORDER — NITROGLYCERIN 0.2 MG/HR TD PT24: MEDICATED_PATCH | TRANSDERMAL | 12 refills | Status: AC

## 2023-09-19 ENCOUNTER — Ambulatory Visit: Payer: Self-pay | Admitting: Family Medicine

## 2023-09-19 NOTE — Telephone Encounter (Signed)
 Forwarding to Dr. Denyse Amass to review and advise.

## 2023-09-19 NOTE — Progress Notes (Signed)
 Lumbar spine MRI shows some arthritis in the back and a potential pinched nerve in the left side but not much would cause right leg pain.  Recommend return to clinic to go over the results in full detail and discuss next steps.

## 2023-09-25 ENCOUNTER — Ambulatory Visit: Admitting: Family Medicine

## 2023-09-25 ENCOUNTER — Encounter: Payer: Self-pay | Admitting: Family Medicine

## 2023-09-25 ENCOUNTER — Ambulatory Visit (INDEPENDENT_AMBULATORY_CARE_PROVIDER_SITE_OTHER)

## 2023-09-25 VITALS — BP 124/82 | HR 82

## 2023-09-25 DIAGNOSIS — M1711 Unilateral primary osteoarthritis, right knee: Secondary | ICD-10-CM | POA: Diagnosis not present

## 2023-09-25 DIAGNOSIS — I7143 Infrarenal abdominal aortic aneurysm, without rupture: Secondary | ICD-10-CM

## 2023-09-25 DIAGNOSIS — I714 Abdominal aortic aneurysm, without rupture, unspecified: Secondary | ICD-10-CM | POA: Insufficient documentation

## 2023-09-25 DIAGNOSIS — M25561 Pain in right knee: Secondary | ICD-10-CM

## 2023-09-25 DIAGNOSIS — G8929 Other chronic pain: Secondary | ICD-10-CM

## 2023-09-25 DIAGNOSIS — M5416 Radiculopathy, lumbar region: Secondary | ICD-10-CM | POA: Diagnosis not present

## 2023-09-25 NOTE — Patient Instructions (Signed)
 Thank you for coming in today.   Please get an Xray today before you leave   We are ordering an MRI for you today.  The imaging office will be calling you to schedule your appointment after we obtain authorization from your insurance company.  If you have not heard from [facility] within a week, please call [phone] to schedule your MRI.   Please be sure you have signed up for MyChart so that we can get your results to you.  We will be in touch with you as soon as we can.  Please know, it can take up to 3-4 business days for the radiologist and Dr. Alease Hunter to have time to review the results and determine the best plan of care.  If there is something that appears to be surgical or needs a referral to other specialists we will let you know through MyChart or telephone.  Otherwise we will plan to schedule a follow up appointment with Dr. Alease Hunter once we have the results.    See you back for MRI review.

## 2023-09-25 NOTE — Progress Notes (Signed)
 I, Jasmin Stewart, CMA acting as a scribe for Jasmin Juniper, MD.  Jasmin Stewart is a 58 y.o. female who presents to [facility] at [facility] today for f/u R knee and lower leg pain w/ MRI review. Pt was last seen by Dr. Alease Stewart on 08/22/23 and was advised to use compression, Voltaren  gel, and a vasc US  was ordered. Later, after exchanging MyChart messages a l-spine MRI was ordered.  Today, pt reports abnormal gait d/t pain. Having shooting pain radiating from the knee to the arch of the foot, only with certain movements. Wonders if sx are coming more from the knee than the back. 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 Notes that the knee / leg will start to shake when trying to extend the knee.   Dx testing: 09/14/23 L-spine MRI  08/23/23 R LE vasc US  08/05/22 L-spine MRI  07/26/22 L-spine XR 07/20/22 Vasc US   Pertinent review of systems: No fevers or chills  Relevant historical information: Elevated coronary artery calcium  score   Exam:  BP 124/82   Pulse 82   LMP 08/22/2017 Comment: patient has some spotting last spotting was in January   SpO2 96%  General: Well Developed, well nourished, and in no acute distress.   MSK: Right knee normal-appearing decreased motion.  Mildly tender palpation medial joint line.  L-spine nontender palpation midline decreased lumbar motion.    Lab and Radiology Results  X-ray images of right knee obtained today but not completed  MR LUMBAR SPINE WITHOUT IV CONTRAST   COMPARISON: 08/05/2022   CLINICAL HISTORY: Lumbar radiculopathy.   TECHNIQUE: SAG T2, SAG T1, SAG STIR, AX T2, AX T1 without IV contrast.   FINDINGS: For the purposes of this examination the lowest rectangle or vertebral body is labeled L5. There is sacralization of the L5 vertebral body. There is stable alignment of the lumbar spine. Mild disc desiccation is seen at L3-4 and L4-5. Mild facet arthrosis present. Mild facet edema is  identified at the L3-4 level. There is no vertebral body height loss, subluxation or marrow replacing process. The sacrum and SI joints are unremarkable so far as visualized. Conus and cauda equina are unremarkable.   T12-L1: There is no focal disc protrusion, foraminal or spinal stenosis.   L1-2: There is no focal disc protrusion, foraminal or spinal stenosis.   L2-3: There is no focal disc protrusion, foraminal or spinal stenosis.   L3-4: Mild broad-based disc osteophyte mild-to-moderate facet arthrosis. No significant foraminal or spinal stenosis. Slight caudal foraminal narrowing is present on the right which is unchanged.   L4-5: Broad-based disc osteophyte mild-to-moderate facet arthrosis, left greater than right. Findings are relatively stable. There is mild caudal foraminal narrowing on the left. No impingement of the exiting L4 nerve. Stable mild crowding of the descending nerve roots in the left lateral recess without impingement.   L5-S1: Mild disc desiccation. Mild facet arthrosis. There is a stable extraforaminal disc osteophyte on the left abutting the extraforaminal left L5 nerve. Correlation for left L5 radicular symptoms.   Incidental note is made of a mild aneurysm of the infrarenal abdominal aorta measuring 3 cm in maximum diameter. Otherwise, the retroperitoneal structures demonstrate no significant abnormality. There is slight abnormal axis of the right kidney, likely of no clinical significance.   IMPRESSION: Stable degenerative spondylosis throughout the lumbar spine with disc desiccation most notably at L3-4 and L4-5. Mild facet arthrosis is present at L3-4 with mild facet edema.  No high-grade spinal or foraminal stenosis is present.   There is a stable extraforaminal disc osteophyte on the left at L5-S1 slightly laterally displacing the left L5 nerve. This is unchanged when compared with the prior examination.   No acute interval findings.    Electronically signed by: Jasmin Alberta MD 09/19/2023 12:21 PM EDT RP Workstation: [id number] Jasmin Stewart, personally (independently) visualized and performed the interpretation of the images attached in this note.    Assessment and Plan: 58 y.o. female with right leg and knee pain.  Etiology is still unclear.  Her pain originates around the knee and extends down to the lower leg.  This was originally thought to be lumbar radiculopathy right L5 or maybe L4.  However this diagnosis does not fit with the recent MRI findings that did not show any right L5 radiculopathy.  Next this diagnosis is referred pain from the knee.  Plan for knee x-ray and knee MRI.  She has an incidentally noted infrarenal abdominal aortic aneurysm measuring 3 cm seen on MRI lumbar spine.  She has an established relationship with her cardiologist Jasmin Stewart.  I will send a message to both her PCP and cardiologist about this finding and advise a follow-up appointment with her cardiologist.  Check back with me once we get the knee MRI back.   PDMP not reviewed this encounter. Orders Placed This Encounter  Procedures   DG Knee AP/LAT W/Sunrise Right    Standing Status:   Future    Expiration Date:   10/25/2023    Reason for Exam (SYMPTOM  OR DIAGNOSIS REQUIRED):   right knee pain    Preferred imaging location?:    [facility]    Is patient pregnant?:   No   MR KNEE RIGHT WO CONTRAST    Standing Status:   Future    Expiration Date:   10/25/2023    What is the patient's sedation requirement?:   No Sedation    Does the patient have a pacemaker or implanted devices?:   No    Preferred imaging location?:   [facility] (table limit-550lbs)   No orders of the defined types were placed in this encounter.    Discussed warning signs or symptoms. Please see discharge instructions. Patient expresses understanding.   The above documentation has been reviewed and is accurate and complete Jasmin Stewart,  M.D.

## 2023-09-26 ENCOUNTER — Encounter: Payer: Self-pay | Admitting: Family Medicine

## 2023-09-26 ENCOUNTER — Encounter: Payer: Self-pay | Admitting: Cardiovascular Disease

## 2023-09-30 ENCOUNTER — Ambulatory Visit
Admission: RE | Admit: 2023-09-30 | Discharge: 2023-09-30 | Discharge status: Home / Self Care | Source: Ambulatory Visit | Attending: Family Medicine | Admitting: Family Medicine

## 2023-09-30 DIAGNOSIS — G8929 Other chronic pain: Secondary | ICD-10-CM

## 2023-09-30 DIAGNOSIS — M25461 Effusion, right knee: Secondary | ICD-10-CM | POA: Diagnosis not present

## 2023-09-30 DIAGNOSIS — M94261 Chondromalacia, right knee: Secondary | ICD-10-CM | POA: Diagnosis not present

## 2023-09-30 DIAGNOSIS — M7121 Synovial cyst of popliteal space [Baker], right knee: Secondary | ICD-10-CM | POA: Diagnosis not present

## 2023-10-01 ENCOUNTER — Ambulatory Visit: Payer: Self-pay | Admitting: Family Medicine

## 2023-10-01 NOTE — Progress Notes (Signed)
 Right knee MRI shows some degeneration of the medial meniscus and arthritis in that same area.  You do have some swelling in the knee joint and a small Baker's cyst that has ruptured in the back of the knee. Please return to clinic to go the results in full detail and discuss treatment plan and options.

## 2023-10-03 NOTE — Progress Notes (Signed)
 Right knee x-ray shows mild arthritis.

## 2023-10-04 ENCOUNTER — Ambulatory Visit: Admitting: Family Medicine

## 2023-10-04 ENCOUNTER — Other Ambulatory Visit: Payer: Self-pay

## 2023-10-04 ENCOUNTER — Encounter: Payer: Self-pay | Admitting: Family Medicine

## 2023-10-04 VITALS — BP 116/80 | HR 85 | Ht 66.0 in

## 2023-10-04 DIAGNOSIS — G8929 Other chronic pain: Secondary | ICD-10-CM | POA: Diagnosis not present

## 2023-10-04 DIAGNOSIS — M25561 Pain in right knee: Secondary | ICD-10-CM

## 2023-10-04 DIAGNOSIS — M5441 Lumbago with sciatica, right side: Secondary | ICD-10-CM | POA: Diagnosis not present

## 2023-10-04 NOTE — Progress Notes (Signed)
 I, Leotis Batter, CMA acting as a scribe for Artist Lloyd, MD.  Jasmin Stewart is a 58 y.o. female who presents to [facility] today for f/u R leg pain w/ MRI review. Pt was last seen by Dr. Lloyd on 09/25/23 and a knee MRI was ordered. Pt was advised to f/u w/ her cardiologist on the incidental findings of a infrarenal abdominal aortic aneurysm .  Today, pt reports new right-sided lower back pain x 3 days. Continues pain in the right leg, knee, and foot. Has been taking IBU and Omeprazole. No improvement of sx since last visit. Denies worsening sx, outside of the foot pain. Knee pain worse with full extension. Review MRI L-spine today.   Dx testing: 09/30/23 R knee MRI 09/25/23 R knee XR 09/14/23 L-spine MRI             08/23/23 R LE vasc US  08/05/22 L-spine MRI             07/26/22 L-spine XR 07/20/22 Vasc US   Pertinent review of systems: No fevers or chills  Relevant historical information: Aortic aneurysm   Exam:  BP 116/80   Pulse 85   Ht 5' 6 (1.676 m)   LMP 08/22/2017 Comment: patient has some spotting last spotting was in January   SpO2 96%   BMI 36.45 kg/m  General: Well Developed, well nourished, and in no acute distress.   MSK: Right knee mild effusion normal.  Otherwise lacking full range of motion with extension.  Tender palpation posterior knee.    Lab and Radiology Results  Procedure: Real-time Ultrasound Guided aspiration and injection of right knee Baker's cyst Device: Philips Affiniti 50G/GE Logiq Images permanently stored and available for review in PACS Verbal informed consent obtained.  Discussed risks and benefits of procedure. Warned about infection, bleeding, hyperglycemia damage to structures among others. Patient expresses understanding and agreement Time-out conducted.   Noted no overlying erythema, induration, or other signs of local infection.   Skin prepped in a sterile fashion.   Local anesthesia: Topical Ethyl  chloride.   With sterile technique and under real time ultrasound guidance: 3 mL of lidocaine  injected into subcutaneous tissue overlying Baker's cyst achieving good anesthesia. Skin was again sterilized with isopropyl alcohol. 18-gauge needle used to access the Baker's cyst. 6 mL of clear yellow fluid was aspirated decompressing the Baker's cyst. Syringe was exchanged and 40 mg of Kenalog  and 2 mL of Marcaine  were injected into the Baker's cyst..   Completed without difficulty   Pain immediately resolved suggesting accurate placement of the medication.   Advised to call if fevers/chills, erythema, induration, drainage, or persistent bleeding.   Images permanently stored and available for review in the ultrasound unit.  Impression: Technically successful ultrasound guided injection.   EXAM DESCRIPTION: MR KNEE RIGHT WO CONTRAST   CLINICAL HISTORY: Knee pain, chronic, negative xray (Age >= 5y)   COMPARISON: None Available.   TECHNIQUE: MRI of the knee is performed according to our usual protocol with multiplanar multi sequence imaging.   FINDINGS: The anterior cruciate ligament and posterior cruciate ligament are intact. The medial collateral ligament and lateral collateral ligament are intact. The lateral meniscus is unremarkable. Mild myxoid degeneration to the body and posterior horn of the medial meniscus without tear.   Mild medial and patellofemoral compartment chondromalacia. Mild degenerative edema to the medial tibial plateau. Small joint effusion and small mildly ruptured popliteal cyst.   IMPRESSION: Myxoid degeneration medial meniscus without tear.  Mild medial compartment chondromalacia with mild degenerative edema.   Small joint effusion and small ruptured popliteal cyst.   Electronically signed by: Reyes Frees MD 09/30/2023 12:32 PM EDT RP Workstation: [id number]  MR LUMBAR SPINE WITHOUT IV CONTRAST   COMPARISON: 08/05/2022   CLINICAL HISTORY: Lumbar  radiculopathy.   TECHNIQUE: SAG T2, SAG T1, SAG STIR, AX T2, AX T1 without IV contrast.   FINDINGS: For the purposes of this examination the lowest rectangle or vertebral body is labeled L5. There is sacralization of the L5 vertebral body. There is stable alignment of the lumbar spine. Mild disc desiccation is seen at L3-4 and L4-5. Mild facet arthrosis present. Mild facet edema is identified at the L3-4 level. There is no vertebral body height loss, subluxation or marrow replacing process. The sacrum and SI joints are unremarkable so far as visualized. Conus and cauda equina are unremarkable.   T12-L1: There is no focal disc protrusion, foraminal or spinal stenosis.   L1-2: There is no focal disc protrusion, foraminal or spinal stenosis.   L2-3: There is no focal disc protrusion, foraminal or spinal stenosis.   L3-4: Mild broad-based disc osteophyte mild-to-moderate facet arthrosis. No significant foraminal or spinal stenosis. Slight caudal foraminal narrowing is present on the right which is unchanged.   L4-5: Broad-based disc osteophyte mild-to-moderate facet arthrosis, left greater than right. Findings are relatively stable. There is mild caudal foraminal narrowing on the left. No impingement of the exiting L4 nerve. Stable mild crowding of the descending nerve roots in the left lateral recess without impingement.   L5-S1: Mild disc desiccation. Mild facet arthrosis. There is a stable extraforaminal disc osteophyte on the left abutting the extraforaminal left L5 nerve. Correlation for left L5 radicular symptoms.   Incidental note is made of a mild aneurysm of the infrarenal abdominal aorta measuring 3 cm in maximum diameter. Otherwise, the retroperitoneal structures demonstrate no significant abnormality. There is slight abnormal axis of the right kidney, likely of no clinical significance.   IMPRESSION: Stable degenerative spondylosis throughout the lumbar spine with  disc desiccation most notably at L3-4 and L4-5. Mild facet arthrosis is present at L3-4 with mild facet edema. No high-grade spinal or foraminal stenosis is present.   There is a stable extraforaminal disc osteophyte on the left at L5-S1 slightly laterally displacing the left L5 nerve. This is unchanged when compared with the prior examination.   No acute interval findings.   Electronically signed by: Norleen Satchel MD 09/19/2023 12:21 PM EDT RP Workstation: [id number]   LILLETTE Artist Lloyd, personally (independently) visualized and performed the interpretation of the images attached in this note.     Assessment and Plan: 57 y.o. female with right knee and leg pain and right low back pain.  Knee pain multifactorial.  She does have degenerative changes.  The majority of the pain today was related to the Baker's cyst as she had significant improvement after aspiration and injection of the Baker's cyst.  If her knee pain continues consider interarticular knee injection as soon as 1 week.  She also has chronic right low back pain.  She may have some radicular component but this is less dominant.  We could consider trials of facet injections or epidural steroid injections.  However before we do that physical therapy should be used.  Plan for referral to PT for her back and also for knee pain.     PDMP not reviewed this encounter. Orders Placed This Encounter  Procedures   US  LIMITED JOINT  SPACE STRUCTURES LOW RIGHT(NO LINKED CHARGES)    Reason for Exam (SYMPTOM  OR DIAGNOSIS REQUIRED):   right knee pain    Preferred imaging location?:   [facility] referral to Physical Therapy    Referral Priority:   Routine    Referral Type:   Physical Medicine    Referral Reason:   Specialty Services Required    Requested Specialty:   Physical Therapy    Number of Visits Requested:   1   No orders of the defined types were placed in this  encounter.    Discussed warning signs or symptoms. Please see discharge instructions. Patient expresses understanding.   The above documentation has been reviewed and is accurate and complete Artist Lloyd, M.D.

## 2023-10-04 NOTE — Patient Instructions (Addendum)
 Thank you for coming in today.   You received an injection today. Seek immediate medical attention if the joint becomes red, extremely painful, or is oozing fluid.   A referral for physical therapy has been submitted. A representative from the physical therapy office will contact you to coordinate scheduling after confirming your benefits with your insurance provider. If you do not hear from the physical therapy office within the next 1-2 weeks, please let us know.

## 2023-10-16 NOTE — Telephone Encounter (Signed)
 Forwarding to Dr. Denyse Amass to review and advise.

## 2023-10-29 ENCOUNTER — Ambulatory Visit: Attending: Family Medicine | Admitting: Physical Therapy

## 2023-10-29 ENCOUNTER — Encounter: Payer: Self-pay | Admitting: Physical Therapy

## 2023-10-29 DIAGNOSIS — M5459 Other low back pain: Secondary | ICD-10-CM | POA: Diagnosis not present

## 2023-10-29 DIAGNOSIS — R262 Difficulty in walking, not elsewhere classified: Secondary | ICD-10-CM | POA: Diagnosis not present

## 2023-10-29 DIAGNOSIS — G8929 Other chronic pain: Secondary | ICD-10-CM | POA: Diagnosis not present

## 2023-10-29 DIAGNOSIS — M25561 Pain in right knee: Secondary | ICD-10-CM | POA: Insufficient documentation

## 2023-10-29 DIAGNOSIS — M5441 Lumbago with sciatica, right side: Secondary | ICD-10-CM | POA: Insufficient documentation

## 2023-10-29 NOTE — Therapy (Signed)
 OUTPATIENT PHYSICAL THERAPY LOWER EXTREMITY EVALUATION   Patient Name: Jasmin Stewart MRN: [id number] DOB:October 19, 1965, 58 y.o., female Today's Date: 10/29/2023  END OF SESSION:  PT End of Session - 10/29/23 1732     Visit Number 1    Number of Visits 30    Date for PT Re-Evaluation 01/29/24    Authorization Type [employer]    PT Start Time 1735    PT Stop Time 1822    PT Time Calculation (min) 47 min    Activity Tolerance Patient tolerated treatment well    Behavior During Therapy WFL for tasks assessed/performed          Past Medical History:  Diagnosis Date   Abdominal hernia    Anemia    Asthma    Fibroid, uterine    never had surgery   History of blood transfusion 1991; 1995   related to childbirth   History of chicken pox    Age 15   Hypercholesteremia    Hypoglycemia    Hypokalemia    Hypotension    Pneumonia 2013,2014, 2018   PVC (premature ventricular contraction)    Ulcer of esophagus    Past Surgical History:  Procedure Laterality Date   ABDOMINAL HERNIA REPAIR  2007; 06/05/2017   Suprapubic, Surgery 2007 with mesh VHR w/mesh; OPEN VENTRAL HERNIA REPAIR WITH MESH   CESAREAN SECTION  1991; 1995; 1998   COLONOSCOPY  05/2022   ESOPHAGOGASTRODUODENOSCOPY  01/2017   HERNIA REPAIR  2007; 06/05/2017   INSERTION OF MESH N/A 06/05/2017   Procedure: INSERTION OF MESH;  Surgeon: Belinda Cough, MD;  Location: [facility] OR;  Service: General;  Laterality: N/A;   VENTRAL HERNIA REPAIR N/A 06/05/2017   Procedure: OPEN VENTRAL HERNIA REPAIR WITH MESH;  Surgeon: Belinda Cough, MD;  Location: [facility] OR;  Service: General;  Laterality: N/A;   Patient Active Problem List   Diagnosis Date Noted   AAA (abdominal aortic aneurysm) without rupture (HCC) 09/25/2023   Viral URI 05/11/2023   Elevated coronary artery calcium  score 11/08/2022   Subacromial bursitis of right shoulder joint 01/11/2021   Cervical radiculopathy 01/03/2021   Enteritis 10/24/2019   Calcific tendinitis of left  shoulder 12/02/2018   Hyperlipidemia 04/19/2018   Ventral incisional hernia 06/05/2017   PVC's (premature ventricular contractions) 06/09/2016    PCP: Dorina, PA  REFERRING PROVIDER: Joane, MD  REFERRING DIAG: right knee pain, back pain  THERAPY DIAG:  Acute pain of right knee  Difficulty in walking, not elsewhere classified  Other low back pain  Rationale for Evaluation and Treatment: Rehabilitation  ONSET DATE: 08/22/23  SUBJECTIVE:   SUBJECTIVE STATEMENT: Patient reports that a few months ago she started having some right knee pain mostly posterior.  She has a reported Baker's cyst, that has been drained and then a cortisone injection.    PERTINENT HISTORY: Abdominal hernia repair,asthma, anemia PAIN:  Are you having pain? Yes: NPRS scale: 0/10 Pain location: right knee anterior lateral and posterior, right buttock Pain description: ache, sore Aggravating factors: stairs, standing pain up to 10/10 Relieving factors: RICE Tylenol , pain can be 0/10  PRECAUTIONS: None  RED FLAGS: None   WEIGHT BEARING RESTRICTIONS: No  FALLS:  Has patient fallen in last 6 months? No  LIVING ENVIRONMENT: Lives with: lives with their family Lives in: House/apartment Stairs: Yes: Internal: 36 steps; can reach both Has following equipment at home: None  OCCUPATION: [employer]  PLOF: Independent and bike 15-20 minutes, lives on the 3rd floor, goes to  the pool quite often  PATIENT GOALS: less pain, able to continue my path to losing weight  NEXT MD VISIT: none scheduled  OBJECTIVE:  Note: Objective measures were completed at Evaluation unless otherwise noted.  DIAGNOSTIC FINDINGS: IMPRESSION: Myxoid degeneration medial meniscus without tear.   Mild medial compartment chondromalacia with mild degenerative edema.   Small joint effusion and small ruptured popliteal cyst.  IMPRESSION: Stable degenerative spondylosis throughout the lumbar spine with disc desiccation  most notably at L3-4 and L4-5. Mild facet arthrosis is present at L3-4 with mild facet edema. No high-grade spinal or foraminal stenosis is present.   There is a stable extraforaminal disc osteophyte on the left at L5-S1 slightly laterally displacing the left L5 nerve. This is unchanged when compared with the prior examination.   No acute interval findings.   COGNITION: Overall cognitive status: Within functional limits for tasks assessed     SENSATION: WFL  EDEMA:  Mild in the anterior knee  MUSCLE LENGTH: positive SLR at 60 degrees with pain, mild tightness in the piriformis, tight ITB and calves  POSTURE: rounded shoulders and forward head  PALPATION: Very tender in the right buttock, right ITB, right lateral knee  LOWER EXTREMITY ROM:  Active ROM Right eval Left eval  Hip flexion    Hip extension    Hip abduction    Hip adduction    Hip internal rotation    Hip external rotation    Knee flexion 122 P!   Knee extension 0   Ankle dorsiflexion    Ankle plantarflexion    Ankle inversion    Ankle eversion     (Blank rows = not tested)  LUMBAR ROM:  Flexion WFL's with popping, extension decreased 50% with some pain, side bending decreased 25%  LOWER EXTREMITY MMT:  MMT Right eval Left eval  Hip flexion 4-   Hip extension    Hip abduction 4-   Hip adduction    Hip internal rotation    Hip external rotation    Knee flexion 4-   Knee extension 4-   Ankle dorsiflexion 4+   Ankle plantarflexion    Ankle inversion    Ankle eversion     (Blank rows = not tested)  LOWER EXTREMITY SPECIAL TESTS:  Knee special tests: Posterior drawer test: negative and Apley's test: negative  FUNCTIONAL TESTS:  5 times sit to stand: 21 seconds Timed up and go (TUG): 19 seconds  GAIT: Distance walked: 100 feet Assistive device utilized: None Level of assistance: Complete Independence Comments: mild antalgic gait on the right                                                                                                                                  TREATMENT DATE:  10/29/23 Evaluation HEP, education on POC and the dx    PATIENT EDUCATION:  Education details: POC, HEP and her dx Person educated: Patient Education method: Explanation,  Demonstration, Tactile cues, Verbal cues, and Handouts Education comprehension: verbalized understanding and verbal cues required  HOME EXERCISE PROGRAM: Access Code: [email or web address] URL: [email or web address] Date: 10/29/2023 Prepared by: Ozell Mainland  Exercises - Seated Hamstring Stretch with Chair  - 1 x daily - 7 x weekly - 1 sets - 5 reps - 30 hold - Standing Gastroc Stretch on Step with Counter Support  - 1 x daily - 7 x weekly - 1 sets - 5 reps - 30 hold - Hooklying Single Knee to Chest Stretch  - 1 x daily - 7 x weekly - 1 sets - 10 reps - 10 hold - Supine Double Knee to Chest  - 1 x daily - 7 x weekly - 1 sets - 10 reps - 10 hold - Supine Lower Trunk Rotation  - 1 x daily - 7 x weekly - 1 sets - 10 reps - 10 hold - Supine Posterior Pelvic Tilt  - 1 x daily - 7 x weekly - 1 sets - 10 reps - 10 hold  ASSESSMENT:  CLINICAL IMPRESSION: Patient is a 58 y.o. female who was seen today for physical therapy evaluation and treatment for right knee pain and low back pain.  She has MRI's as noted above with some indication that the back and the knee has issues, she is very active and trying to be more active.  She is tight in the HS, ITB and calves, very tender in the right buttock, Had doppler study to rule out DVT, She is weak in the core and hips.  Has a history of hernia repairs.   OBJECTIVE IMPAIRMENTS: Abnormal gait, cardiopulmonary status limiting activity, decreased activity tolerance, decreased balance, decreased coordination, decreased endurance, decreased mobility, difficulty walking, decreased ROM, decreased strength, increased edema, increased muscle spasms, impaired flexibility, improper body  mechanics, and pain.  REHAB POTENTIAL: Good  CLINICAL DECISION MAKING: Stable/uncomplicated  EVALUATION COMPLEXITY: Low   GOALS: Goals reviewed with patient? Yes  SHORT TERM GOALS: Target date: 11/29/23 Independent with initial HEP Baseline: Goal status: INITIAL   LONG TERM GOALS: Target date: 01/29/24  Independent with advanced HEP Baseline:  Goal status: INITIAL  2.  Understand posture and body mechanics Baseline:  Goal status: INITIAL  3.  Decrease TUG time to 12 seconds Baseline:  Goal status: INITIAL  4.  Decrease overall pain 50% Baseline:  Goal status: INITIAL  5.  Increase right LE strength to 4+/5 Baseline:  Goal status: INITIAL  PLAN:  PT FREQUENCY: 1x/week  PT DURATION: 12 weeks  PLANNED INTERVENTIONS: 97164- PT Re-evaluation, 97110-Therapeutic exercises, 97530- Therapeutic activity, 97112- Neuromuscular re-education, 97535- Self Care, [id number]- Manual therapy, [phone]- Gait training, [phone]- Electrical stimulation (unattended), 97016- Vasopneumatic device, 97035- Ultrasound, 02966- Ionotophoresis 4mg /ml Dexamethasone , Patient/Family education, Balance training, Stair training, Taping, Joint mobilization, Cryotherapy, and Moist heat  PLAN FOR NEXT SESSION: work on flexibility, core stability but be careful due to past hernia surgeries, start gym activities , could try traction   MAINLAND OZELL ORN, PT 10/29/2023, 5:33 PM

## 2023-10-31 ENCOUNTER — Ambulatory Visit: Admitting: Nurse Practitioner

## 2023-11-12 ENCOUNTER — Encounter: Payer: Self-pay | Admitting: Cardiovascular Disease

## 2023-11-16 ENCOUNTER — Ambulatory Visit: Admitting: Medical

## 2023-11-16 ENCOUNTER — Other Ambulatory Visit (HOSPITAL_BASED_OUTPATIENT_CLINIC_OR_DEPARTMENT_OTHER): Payer: Self-pay

## 2023-11-16 VITALS — BP 112/70 | HR 82 | Temp 98.3°F | Resp 16 | Ht 66.0 in | Wt 218.2 lb

## 2023-11-16 DIAGNOSIS — J0100 Acute maxillary sinusitis, unspecified: Secondary | ICD-10-CM

## 2023-11-16 DIAGNOSIS — J029 Acute pharyngitis, unspecified: Secondary | ICD-10-CM | POA: Diagnosis not present

## 2023-11-16 DIAGNOSIS — J3489 Other specified disorders of nose and nasal sinuses: Secondary | ICD-10-CM

## 2023-11-16 DIAGNOSIS — R059 Cough, unspecified: Secondary | ICD-10-CM

## 2023-11-16 DIAGNOSIS — R0981 Nasal congestion: Secondary | ICD-10-CM

## 2023-11-16 MED ORDER — AMOXICILLIN-POT CLAVULANATE 875-125 MG PO TABS
1.0000 | ORAL_TABLET | Freq: Two times a day (BID) | ORAL | 0 refills | Status: DC
  Filled 2023-11-16: qty 20, 10d supply, fill #0

## 2023-11-16 MED ORDER — BENZONATATE 100 MG PO CAPS
100.0000 mg | ORAL_CAPSULE | Freq: Three times a day (TID) | ORAL | 0 refills | Status: DC | PRN
  Filled 2023-11-16: qty 30, 10d supply, fill #0

## 2023-11-16 NOTE — Progress Notes (Signed)
 Subjective:    Patient ID: Jasmin Stewart, female    DOB: 04/09/1966, 58 y.o.   MRN: [id number]  HPI  Jasmin Stewart is a 58 year old female who presents with sinus congestion and facial pain.  Symptoms began on Tuesday night with a scratchy throat, which progressed to soreness by Wednesday. By Wednesday, she also experienced sneezing, congestion, and body aches. The sore throat and body aches have since resolved.  On Thursday, she developed a cough and significant sinus congestion, leading her to stay home from work. She describes pain and pressure in the maxillary sinus area, stating it feels like 'somebody slapped the crap out of me.' She also reports waking up covered in sweat on Thursday, although she did not measure her temperature.  She has been managing her symptoms with increased fluid intake, including orange juice and tea, and medications such as Benadryl , Mucinex , and Sudafed. She has not used Afrin but finds Flonase  difficult to use due to nasal congestion.  A COVID test was performed  by pt and returned negative. She has a history of sinus infections but notes it has been a long time since her last episode.       Review of Systems  Constitutional:  Negative for chills, fatigue and fever.  HENT:  Positive for congestion, sinus pressure and sinus pain. Negative for ear discharge and sore throat.   Respiratory:  Positive for cough. Negative for chest tightness and wheezing.   Cardiovascular:  Negative for chest pain and palpitations.  Gastrointestinal:  Negative for abdominal pain, blood in stool, nausea and vomiting.  Musculoskeletal:  Negative for back pain and neck pain.  Skin:  Negative for rash.  Hematological:  Negative for adenopathy. Does not bruise/bleed easily.  Psychiatric/Behavioral:  Negative for behavioral problems, confusion and dysphoric mood. The patient is not nervous/anxious.     Past Medical History:  Diagnosis Date   Abdominal hernia    Anemia     Asthma    Fibroid, uterine    never had surgery   History of blood transfusion 1991; 1995   related to childbirth   History of chicken pox    Age 23   Hypercholesteremia    Hypoglycemia    Hypokalemia    Hypotension    Pneumonia 2013,2014, 2018   PVC (premature ventricular contraction)    Ulcer of esophagus      Social History   Socioeconomic History   Marital status: Single    Spouse name: Not on file   Number of children: Not on file   Years of education: Not on file   Highest education level: GED or equivalent  Occupational History   Not on file  Tobacco Use   Smoking status: Every Day    Current packs/day: 7.00    Average packs/day: 7.0 packs/day for 36.0 years (252.0 ttl pk-yrs)    Types: Cigarettes   Smokeless tobacco: Never  Vaping Use   Vaping status: Former  Substance and Sexual Activity   Alcohol use: Not Currently    Alcohol/week: 0.0 standard drinks of alcohol   Drug use: No   Sexual activity: Not Currently    Birth control/protection: Post-menopausal    Comment: 1st intercourse 58 yo-More than 5 partners  Other Topics Concern   Not on file  Social History Narrative   Not on file   Social Drivers of Health   Financial Resource Strain: Medium Risk (11/16/2023)   Overall Financial Resource Strain (CARDIA)  Difficulty of Paying Living Expenses: Somewhat hard  Food Insecurity: Patient Declined (11/16/2023)   Hunger Vital Sign    Worried About Running Out of Food in the Last Year: Patient declined    Ran Out of Food in the Last Year: Patient declined  Transportation Needs: No Transportation Needs (11/16/2023)   PRAPARE - Administrator, Civil Service (Medical): No    Lack of Transportation (Non-Medical): No  Physical Activity: Insufficiently Active (11/16/2023)   Exercise Vital Sign    Days of Exercise per Week: 2 days    Minutes of Exercise per Session: 30 min  Stress: No Stress Concern Present (11/16/2023)   [employer] of Occupational  Health - Occupational Stress Questionnaire    Feeling of Stress: Not at all  Social Connections: Socially Isolated (11/16/2023)   Social Connection and Isolation Panel    Frequency of Communication with Friends and Family: Twice a week    Frequency of Social Gatherings with Friends and Family: Once a week    Attends Religious Services: Never    Database administrator or Organizations: No    Attends Engineer, structural: Not on file    Marital Status: Never married  Intimate Partner Violence: Unknown (07/13/2021)   Received from [facility]   HITS    Physically Hurt: Not on file    Insult or Talk Down To: Not on file    Threaten Physical Harm: Not on file    Scream or Curse: Not on file    Past Surgical History:  Procedure Laterality Date   ABDOMINAL HERNIA REPAIR  2007; 06/05/2017   Suprapubic, Surgery 2007 with mesh VHR w/mesh; OPEN VENTRAL HERNIA REPAIR WITH MESH   CESAREAN SECTION  1991; 1995; 1998   COLONOSCOPY  05/2022   ESOPHAGOGASTRODUODENOSCOPY  01/2017   HERNIA REPAIR  2007; 06/05/2017   INSERTION OF MESH N/A 06/05/2017   Procedure: INSERTION OF MESH;  Surgeon: Belinda Cough, MD;  Location: [facility] OR;  Service: General;  Laterality: N/A;   VENTRAL HERNIA REPAIR N/A 06/05/2017   Procedure: OPEN VENTRAL HERNIA REPAIR WITH MESH;  Surgeon: Belinda Cough, MD;  Location: [facility] OR;  Service: General;  Laterality: N/A;    Family History  Problem Relation Age of Onset   Thyroid  disease Mother    Atrial fibrillation Mother    Hyperlipidemia Mother    Hyperlipidemia Sister    Breast cancer Sister    Hyperlipidemia Brother    Thyroid  disease Maternal Grandmother    Glaucoma Maternal Grandmother    Cataracts Maternal Grandmother    Esophageal cancer Maternal Grandfather    Hypertension Maternal Uncle    Obesity Maternal Uncle    Allergies Son        Hay Fever   Healthy Daughter    Colon cancer Neg Hx    Colon polyps Neg Hx    Stomach cancer Neg Hx    Rectal cancer  Neg Hx     No Known Allergies  Current Outpatient Medications on File Prior to Visit  Medication Sig Dispense Refill   acetaminophen  (TYLENOL ) 325 MG tablet Take 2 tablets (650 mg total) by mouth every 6 (six) hours as needed for up to 30 doses for moderate pain or mild pain. 30 tablet 0   diclofenac  Sodium (VOLTAREN ) 1 % GEL Apply 4 g topically 4 (four) times daily. 150 g 0   esomeprazole  (NEXIUM ) 20 MG capsule Take 20 mg by mouth daily as needed.  lidocaine  (LIDODERM ) 5 % Place 1 patch onto the skin daily. Remove & Discard patch within 12 hours or as directed by MD 30 patch 0   metoprolol  tartrate (LOPRESSOR ) 25 MG tablet Take 0.5 tablets (12.5 mg total) by mouth 2 (two) times daily. 45 tablet 3   nitroGLYCERIN  (NITRODUR - DOSED IN MG/24 HR) 0.2 mg/hr patch CUT AND APPLY 1/4 PATCH TO MOIST PAINFUL AREA EVERY 24 HOURS 30 patch 12   rosuvastatin  (CRESTOR ) 40 MG tablet TAKE 1 TABLET BY MOUTH DAILY 90 tablet 3   No current facility-administered medications on file prior to visit.    BP 112/70   Pulse 82   Temp 98.3 F (36.8 C) (Oral)   Resp 16   Ht 5' 6 (1.676 m)   Wt 218 lb 3.2 oz (99 kg)   LMP 08/22/2017 Comment: patient has some spotting last spotting was in January   SpO2 97%   BMI 35.22 kg/m         Objective:   Physical Exam  General Mental Status- Alert. General Appearance- Not in acute distress.   Skin General: Color- Normal Color. Moisture- Normal Moisture.  Neck Carotid Arteries- Normal color. Moisture- Normal Moisture. No carotid bruits. No JVD.  Chest and Lung Exam Auscultation: Breath Sounds:-Normal.  Cardiovascular Auscultation:Rythm- Regular. Murmurs & Other Heart Sounds:Auscultation of the heart reveals- No Murmurs.  Abdomen Inspection:-Inspeection Normal. Palpation/Percussion:Note:No mass. Palpation and Percussion of the abdomen reveal- Non Tender, Non Distended + BS, no rebound or guarding.   Neurologic Cranial Nerve exam:- CN III-XII  intact(No nystagmus), symmetric smile. Strength:- 5/5 equal and symmetric strength both upper and lower extremities.    Heent- frontal and maxillary sinus pressure. Very sensitive maxilary sinus on papation.   Lower ext- calfs symmetric, negative homans signs. No pedal edema.    Assessment & Plan:   Patient Instructions  Acute maxillary sinusitis Symptoms and negative COVID test suggest bacterial sinus infection. Initiated antibiotic therapy due to severe maxillary tenderness and risk of progression. - Prescribe Augmentin  for sinusitis. - Recommend Flonase , two sprays in each nostril once a day. - Advise nasal saline spray for irrigation before using Flonase .  Cough Cough without significant respiratory distress. - Prescribe benzonatate  (Tessalon  Perles) for cough relief.  Follow up September for wellness exam or sooner if needed   [employer], PA-C

## 2023-11-16 NOTE — Patient Instructions (Signed)
 Acute maxillary sinusitis Symptoms and negative COVID test suggest bacterial sinus infection. Initiated antibiotic therapy due to severe maxillary tenderness and risk of progression. - Prescribe Augmentin  for sinusitis. - Recommend Flonase , two sprays in each nostril once a day. - Advise nasal saline spray for irrigation before using Flonase .  Cough Cough without significant respiratory distress. - Prescribe benzonatate  (Tessalon  Perles) for cough relief.  Follow up September for wellness exam or sooner if needed

## 2023-11-21 ENCOUNTER — Ambulatory Visit: Admitting: Physical Therapy

## 2023-11-28 ENCOUNTER — Ambulatory Visit: Admitting: Physical Therapy

## 2023-12-05 ENCOUNTER — Ambulatory Visit: Admitting: Physical Therapy

## 2023-12-28 ENCOUNTER — Other Ambulatory Visit: Payer: Self-pay | Admitting: Cardiology

## 2023-12-28 DIAGNOSIS — I493 Ventricular premature depolarization: Secondary | ICD-10-CM

## 2024-02-01 DIAGNOSIS — R92323 Mammographic fibroglandular density, bilateral breasts: Secondary | ICD-10-CM | POA: Diagnosis not present

## 2024-02-01 DIAGNOSIS — Z1231 Encounter for screening mammogram for malignant neoplasm of breast: Secondary | ICD-10-CM | POA: Diagnosis not present

## 2024-05-14 ENCOUNTER — Ambulatory Visit: Admitting: Medical

## 2024-05-14 ENCOUNTER — Other Ambulatory Visit (HOSPITAL_BASED_OUTPATIENT_CLINIC_OR_DEPARTMENT_OTHER): Payer: Self-pay

## 2024-05-14 ENCOUNTER — Other Ambulatory Visit: Payer: Self-pay

## 2024-05-14 ENCOUNTER — Ambulatory Visit

## 2024-05-14 VITALS — BP 122/88 | HR 73 | Temp 98.4°F | Resp 14 | Ht 66.0 in | Wt 234.6 lb

## 2024-05-14 VITALS — BP 122/88 | HR 73 | Ht 66.0 in | Wt 234.0 lb

## 2024-05-14 DIAGNOSIS — M7521 Bicipital tendinitis, right shoulder: Secondary | ICD-10-CM | POA: Diagnosis not present

## 2024-05-14 DIAGNOSIS — M25511 Pain in right shoulder: Secondary | ICD-10-CM

## 2024-05-14 DIAGNOSIS — M7551 Bursitis of right shoulder: Secondary | ICD-10-CM | POA: Diagnosis not present

## 2024-05-14 DIAGNOSIS — M25561 Pain in right knee: Secondary | ICD-10-CM

## 2024-05-14 DIAGNOSIS — I7143 Infrarenal abdominal aortic aneurysm, without rupture: Secondary | ICD-10-CM

## 2024-05-14 DIAGNOSIS — M75121 Complete rotator cuff tear or rupture of right shoulder, not specified as traumatic: Secondary | ICD-10-CM

## 2024-05-14 MED ORDER — DICLOFENAC SODIUM 75 MG PO TBEC
75.0000 mg | DELAYED_RELEASE_TABLET | Freq: Two times a day (BID) | ORAL | 1 refills | Status: AC
  Filled 2024-05-14: qty 42, 21d supply, fill #0

## 2024-05-14 NOTE — Progress Notes (Signed)
 "  Subjective:    Patient ID: Jasmin Stewart, female    DOB: 09/23/1965, 59 y.o.   MRN: [id number]  HPI  Jasmin Stewart is a 59 year old female who presents with right shoulder and arm pain after shoveling snow.  She developed right shoulder and arm pain on Monday after shoveling heavy, slushy snow over the weekend. The pain started while she was working from home and has persisted. She has difficulty lifting her arm above her head and has significant pain with dressing and daily activities.  She has chronic right shoulder problems with prior MRI in 2022 showing supraspinatus and infraspinatus tendinosis with calcific tendinitis, subacromial subdeltoid bursitis, and osteoarthritis, without rotator cuff tear. She has tried nitroglycerin  patches, ibuprofen , Voltaren  gel, a heating pad, and Salonpas patches without relief of the current pain.  She has a known mild infrarenal abdominal aortic aneurysm measuring three centimeters.     Review of Systems  Constitutional:  Negative for chills, fatigue and fever.  Respiratory:  Negative for cough, chest tightness and wheezing.   Cardiovascular:  Negative for chest pain and palpitations.  Gastrointestinal:  Negative for abdominal pain, constipation and nausea.  Musculoskeletal:  Negative for back pain.       Rt shoulder pain  Neurological:  Negative for weakness, numbness and headaches.  Hematological:  Negative for adenopathy.  Psychiatric/Behavioral:  Negative for behavioral problems, decreased concentration and sleep disturbance.    Past Medical History:  Diagnosis Date   Abdominal hernia    Anemia    Asthma    Fibroid, uterine    never had surgery   History of blood transfusion 1991; 1995   related to childbirth   History of chicken pox    Age 6   Hypercholesteremia    Hypoglycemia    Hypokalemia    Hypotension    Pneumonia 2013,2014, 2018   PVC (premature ventricular contraction)    Ulcer of esophagus      Social History    Socioeconomic History   Marital status: Single    Spouse name: Not on file   Number of children: Not on file   Years of education: Not on file   Highest education level: GED or equivalent  Occupational History   Not on file  Tobacco Use   Smoking status: Every Day    Current packs/day: 7.00    Average packs/day: 7.0 packs/day for 36.0 years (252.0 ttl pk-yrs)    Types: Cigarettes   Smokeless tobacco: Never  Vaping Use   Vaping status: Former  Substance and Sexual Activity   Alcohol use: Not Currently    Alcohol/week: 0.0 standard drinks of alcohol   Drug use: No   Sexual activity: Not Currently    Birth control/protection: Post-menopausal    Comment: 1st intercourse 59 yo-More than 5 partners  Other Topics Concern   Not on file  Social History Narrative   Not on file   Social Drivers of Health   Tobacco Use: High Risk (05/14/2024)   Patient History    Smoking Tobacco Use: Every Day    Smokeless Tobacco Use: Never    Passive Exposure: Not on file  Financial Resource Strain: Medium Risk (11/16/2023)   Overall Financial Resource Strain (CARDIA)    Difficulty of Paying Living Expenses: Somewhat hard  Food Insecurity: Patient Declined (11/16/2023)   Epic    Worried About Radiation Protection Practitioner of Food in the Last Year: Patient declined    Barista in the  Last Year: Patient declined  Transportation Needs: No Transportation Needs (11/16/2023)   Epic    Lack of Transportation (Medical): No    Lack of Transportation (Non-Medical): No  Physical Activity: Insufficiently Active (11/16/2023)   Exercise Vital Sign    Days of Exercise per Week: 2 days    Minutes of Exercise per Session: 30 min  Stress: No Stress Concern Present (11/16/2023)   [employer] of Occupational Health - Occupational Stress Questionnaire    Feeling of Stress: Not at all  Social Connections: Socially Isolated (11/16/2023)   Social Connection and Isolation Panel    Frequency of Communication with Friends and  Family: Twice a week    Frequency of Social Gatherings with Friends and Family: Once a week    Attends Religious Services: Never    Database Administrator or Organizations: No    Attends Engineer, Structural: Not on file    Marital Status: Never married  Intimate Partner Violence: Not on file  Depression (PHQ2-9): Low Risk (11/16/2023)   Depression (PHQ2-9)    PHQ-2 Score: 0  Alcohol Screen: Not on file  Housing: High Risk (11/16/2023)   Epic    Unable to Pay for Housing in the Last Year: Yes    Number of Times Moved in the Last Year: 0    Homeless in the Last Year: No  Utilities: Not on file  Health Literacy: Not on file    Past Surgical History:  Procedure Laterality Date   ABDOMINAL HERNIA REPAIR  2007; 06/05/2017   Suprapubic, Surgery 2007 with mesh VHR w/mesh; OPEN VENTRAL HERNIA REPAIR WITH MESH   CESAREAN SECTION  1991; 1995; 1998   COLONOSCOPY  05/2022   ESOPHAGOGASTRODUODENOSCOPY  01/2017   HERNIA REPAIR  2007; 06/05/2017   INSERTION OF MESH N/A 06/05/2017   Procedure: INSERTION OF MESH;  Surgeon: Belinda Cough, MD;  Location: [facility] OR;  Service: General;  Laterality: N/A;   VENTRAL HERNIA REPAIR N/A 06/05/2017   Procedure: OPEN VENTRAL HERNIA REPAIR WITH MESH;  Surgeon: Belinda Cough, MD;  Location: [facility] OR;  Service: General;  Laterality: N/A;    Family History  Problem Relation Age of Onset   Thyroid  disease Mother    Atrial fibrillation Mother    Hyperlipidemia Mother    Hyperlipidemia Sister    Breast cancer Sister    Hyperlipidemia Brother    Thyroid  disease Maternal Grandmother    Glaucoma Maternal Grandmother    Cataracts Maternal Grandmother    Esophageal cancer Maternal Grandfather    Hypertension Maternal Uncle    Obesity Maternal Uncle    Allergies Son        Hay Fever   Healthy Daughter    Colon cancer Neg Hx    Colon polyps Neg Hx    Stomach cancer Neg Hx    Rectal cancer Neg Hx     Allergies[1]  Medications Ordered Prior to  Encounter[2]  BP 122/88   Pulse 73   Temp 98.4 F (36.9 C) (Oral)   Resp 14   Ht 5' 6 (1.676 m)   Wt 234 lb 9.6 oz (106.4 kg)   LMP 08/22/2017 Comment: patient has some spotting last spotting was in January   SpO2 97%   BMI 37.87 kg/m        Objective:   Physical Exam  General- No acute distress. Pleasant patient. Neck- Full range of motion, no jvd Lungs- Clear, even and unlabored. Heart- regular rate and rhythm. Neurologic- CNII- XII grossly  intact.  Rt shoulder- pain on attempted range of motion.Tenderness to palpation over the [facility] joint, biceps tendon       Assessment & Plan:   Patient Instructions  Right shoulder tendinosis with calcific tendinitis, bursitis, and osteoarthritis Chronic right shoulder pain exacerbated by overuse. MRI confirmed tendinosis, calcific tendinitis, bursitis, and osteoarthritis. Discussed risk of frozen shoulder with prolonged immobilization. - Continue with sports medicine specialist appointment today for evaluation and potential steroid injection. - Avoid shoulder overuse to prevent inflammation after potential treatment. But encouage some  rom to avoid frozen shoulder.  Infrarenal abdominal aortic aneurysm without rupture Mild 3 cm infrarenal abdominal aortic aneurysm with low rupture risk. Cardiology recommended follow-up ultrasound in two years. - Continue cardiologist follow-up for aneurysm management. - Repeat abdominal ultrasound in two years to monitor size.  Follow up 2-3 months for wellness exam     [1] No Known Allergies [2]  Current Outpatient Medications on File Prior to Visit  Medication Sig Dispense Refill   acetaminophen  (TYLENOL ) 325 MG tablet Take 2 tablets (650 mg total) by mouth every 6 (six) hours as needed for up to 30 doses for moderate pain or mild pain. 30 tablet 0   diclofenac  Sodium (VOLTAREN ) 1 % GEL Apply 4 g topically 4 (four) times daily. 150 g 0   esomeprazole  (NEXIUM ) 20 MG capsule Take 20 mg by mouth  daily as needed.      lidocaine  (LIDODERM ) 5 % Place 1 patch onto the skin daily. Remove & Discard patch within 12 hours or as directed by MD 30 patch 0   metoprolol  tartrate (LOPRESSOR ) 25 MG tablet Take 0.5 tablets (12.5 mg total) by mouth 2 (two) times daily. 45 tablet 3   nitroGLYCERIN  (NITRODUR - DOSED IN MG/24 HR) 0.2 mg/hr patch CUT AND APPLY 1/4 PATCH TO MOIST PAINFUL AREA EVERY 24 HOURS 30 patch 12   rosuvastatin  (CRESTOR ) 40 MG tablet TAKE 1 TABLET BY MOUTH DAILY 90 tablet 3   No current facility-administered medications on file prior to visit.   "

## 2024-05-14 NOTE — Progress Notes (Signed)
" ° °  Subjective:    Patient ID: Jasmin Stewart, female    DOB: 59 y.o., [birth year]   MRN: [id number]  Chief Complaint: Right shoulder pain   History of Present Illness Jasmin Stewart is a 59 year old female with a history of chronic intermittent pain in her right shoulder. Reports that it flared up previously in the past after episodes of heavy activity/usage. Prior 3-day outing doing kayaking flared this pain in the past This pain was amenable to subacromial corticosteroid injection She reports that this pain today feels similar to this but worse No numbness or tingling radiating down right arm Tylenol  arthritis providing temporary relief Nitroglycerin  patch applied has provided minimal relief.  Review of Pertinent Imaging: Right shoulder MRI from 01/08/2021 IMPRESSION: 1. Mild supraspinatus and infraspinatus tendinosis with calcific tendinitis. No rotator cuff tendon tear. 2. Moderate subacromial/subdeltoid bursitis. 3. Moderate acromioclavicular osteoarthritis.  3 view right shoulder xrays from 01/08/2021 IMPRESSION: Calcific tendinitis of the rotator cuff.    Objective:   Vitals:   05/14/24 1427  BP: 122/88  Pulse: 73              Right Shoulder ( compared to normal ) No bruising. Tenderness to palpation over the [facility] joint, biceps tendon, greater tuberosity, posterior joint 4/5 strength with external rotation, empty can, flexion.  5-/5 strength with internal rotation. + Empty can  Complete US  Examination of the right shoulder: -Biceps: Long head of the biceps well visualized in both long and short axis, and is appears to carry peritendinous fluid and have a fissure present within the body of the tendon proximally. -Pectoralis Major: well visualized at its insertion onto the humerus and without abnormality. -Subscapularis: Well visualized at its attachment to the humerus, and appears to have a large calcification present within the tendon.  The tendon itself appears irregular..  Unable  to tolerate dynamic testing. -Supraspinatus: Well visualized at its attachment to the humerus, and appears irregular with a large intratendinous calcification.  Prominent enlargement of the subacromial bursa.  Unable to tolerate dynamic testing. -Infraspinatus: Well visualized and appears to have a very large calcification present in the body of the tendon. -Teres Minor: Well visualized and appears to have a small calcification near its insertion. -Glenohumeral Joint: Posterior aspect of the GH joint visualized and does not show any signs of effusion. The visible portion of the posterior labrum appears normal. -Acromioclavicular Joint: The AC joint does not have any evidence of separation.  There is degenerative change present within the [facility] joint.  Positive geyser sign  Impression: - Calcific tendinosis of all 4 rotator cuff muscles. - Large subacromial bursitis - Biceps tendinosis - Ability to fully assess for full-thickness rotator cuff tear is limited 2/2 large intratendinous calcifications  Ultrasound examination performed and interpreted by Redell Robes, DO     Assessment & Plan:   Assessment & Plan Jasmin Stewart is a 59 year old female presenting with acute on chronic right shoulder pain concerning for full-thickness rotator cuff tearing.  She has evidence of prominent calcific tendinopathy present in all 4 of her rotator cuff muscles.  After discussion of risks and benefits of injection versus oral anti-inflammatories while awaiting to obtain MRI, we elected to use oral anti-inflammatories.  MRI ordered, follow-up results.   "

## 2024-05-14 NOTE — Patient Instructions (Signed)
 Right shoulder tendinosis with calcific tendinitis, bursitis, and osteoarthritis Chronic right shoulder pain exacerbated by overuse. MRI confirmed tendinosis, calcific tendinitis, bursitis, and osteoarthritis. Discussed risk of frozen shoulder with prolonged immobilization. - Continue with sports medicine specialist appointment today for evaluation and potential steroid injection. - Avoid shoulder overuse to prevent inflammation after potential treatment. But encouage some  rom to avoid frozen shoulder.  Infrarenal abdominal aortic aneurysm without rupture Mild 3 cm infrarenal abdominal aortic aneurysm with low rupture risk. Cardiology recommended follow-up ultrasound in two years. - Continue cardiologist follow-up for aneurysm management. - Repeat abdominal ultrasound in two years to monitor size.  Follow up 2-3 months for wellness exam

## 2024-05-22 ENCOUNTER — Other Ambulatory Visit

## 2024-06-13 ENCOUNTER — Encounter: Admitting: Medical
